# Patient Record
Sex: Male | Born: 1945
Health system: Southern US, Community
[De-identification: ages and names within clinical notes are randomized; demographics above are authoritative.]

## PROBLEM LIST (undated history)

## (undated) DIAGNOSIS — J45909 Unspecified asthma, uncomplicated: Secondary | ICD-10-CM

## (undated) DIAGNOSIS — I10 Essential (primary) hypertension: Secondary | ICD-10-CM

## (undated) DIAGNOSIS — T7840XA Allergy, unspecified, initial encounter: Secondary | ICD-10-CM

## (undated) DIAGNOSIS — E119 Type 2 diabetes mellitus without complications: Secondary | ICD-10-CM

## (undated) DIAGNOSIS — K219 Gastro-esophageal reflux disease without esophagitis: Secondary | ICD-10-CM

## (undated) DIAGNOSIS — R002 Palpitations: Secondary | ICD-10-CM

## (undated) HISTORY — DX: Essential (primary) hypertension: I10

## (undated) HISTORY — PX: CATARACT EXTRACTION: SUR2

## (undated) HISTORY — PX: KNEE SURGERY: SHX244

## (undated) HISTORY — DX: Type 2 diabetes mellitus without complications: E11.9

## (undated) HISTORY — PX: ELBOW SURGERY: SHX618

## (undated) HISTORY — DX: Gastro-esophageal reflux disease without esophagitis: K21.9

## (undated) HISTORY — DX: Palpitations: R00.2

## (undated) HISTORY — DX: Allergy, unspecified, initial encounter: T78.40XA

## (undated) HISTORY — PX: TONSILLECTOMY: SUR1361

---

## 2013-08-01 ENCOUNTER — Encounter (HOSPITAL_COMMUNITY): Payer: Self-pay | Admitting: Emergency Medicine

## 2013-08-01 ENCOUNTER — Emergency Department (HOSPITAL_COMMUNITY)
Admission: EM | Admit: 2013-08-01 | Discharge: 2013-08-01 | Disposition: A | Discharge status: Home / Self Care | Payer: MEDICARE | Attending: Emergency Medicine | Admitting: Emergency Medicine

## 2013-08-01 DIAGNOSIS — Z79899 Other long term (current) drug therapy: Secondary | ICD-10-CM | POA: Insufficient documentation

## 2013-08-01 DIAGNOSIS — Z7982 Long term (current) use of aspirin: Secondary | ICD-10-CM | POA: Insufficient documentation

## 2013-08-01 DIAGNOSIS — J45909 Unspecified asthma, uncomplicated: Secondary | ICD-10-CM | POA: Insufficient documentation

## 2013-08-01 DIAGNOSIS — M25559 Pain in unspecified hip: Secondary | ICD-10-CM | POA: Insufficient documentation

## 2013-08-01 DIAGNOSIS — M25552 Pain in left hip: Secondary | ICD-10-CM

## 2013-08-01 HISTORY — DX: Unspecified asthma, uncomplicated: J45.909

## 2013-08-01 MED ORDER — ONDANSETRON 4 MG PO TBDP
8.0000 mg | ORAL_TABLET | Freq: Once | ORAL | Status: AC
  Administered 2013-08-01: 8 mg via ORAL
  Filled 2013-08-01: qty 2

## 2013-08-01 MED ORDER — OXYCODONE-ACETAMINOPHEN 5-325 MG PO TABS: 2.0000 | ORAL_TABLET | Freq: Four times a day (QID) | ORAL | Status: DC | PRN

## 2013-08-01 MED ORDER — PREDNISONE 20 MG PO TABS: ORAL_TABLET | ORAL | Status: DC

## 2013-08-01 MED ORDER — OXYCODONE-ACETAMINOPHEN 5-325 MG PO TABS
2.0000 | ORAL_TABLET | Freq: Once | ORAL | Status: AC
  Administered 2013-08-01: 2 via ORAL
  Filled 2013-08-01: qty 2

## 2013-08-01 MED ORDER — ONDANSETRON HCL 4 MG PO TABS: 4.0000 mg | ORAL_TABLET | Freq: Four times a day (QID) | ORAL | Status: DC

## 2013-08-01 NOTE — ED Provider Notes (Signed)
CSN: [id number]     Arrival date & time 08/01/13  1221 History   First MD Initiated Contact with Patient 08/01/13 1354     Chief Complaint  Patient presents with  . Hip Pain   (Consider location/radiation/quality/duration/timing/severity/associated sxs/prior Treatment) HPI Comments: Patient is a 67 year old male with history of asthma who presents today for 2 weeks of gradually worsening left hip pain. The pain is a sharp, stabbing pain. It is worse with movement and palpation. He has taken ibuprofen at home with no relief. The pain radiates into his left leg. There is no injury to his hip or back. He has not had pain like this in the past. The pain is becoming so severe he is having difficulty ambulating. He does not use a walker or a cane to ambulate. He denies bowel or bladder incontinence, IV drug abuse, history of cancer. No fevers, chills, numbness, weakness, paresthesias.  The history is provided by the patient. No language interpreter was used.    Past Medical History  Diagnosis Date  . Asthma    History reviewed. No pertinent past surgical history. History reviewed. No pertinent family history. History  Substance Use Topics  . Smoking status: Never Smoker   . Smokeless tobacco: Not on file  . Alcohol Use: Yes     Comment: occ    Review of Systems  Constitutional: Negative for fever and chills.  Respiratory: Negative for shortness of breath.   Cardiovascular: Negative for chest pain.  Gastrointestinal: Negative for nausea, vomiting and abdominal pain.  Genitourinary: Negative for difficulty urinating.  Musculoskeletal: Positive for myalgias, back pain and arthralgias. Negative for gait problem.  All other systems reviewed and are negative.    Allergies  Coconut oil; Iodine; and Shellfish allergy  Home Medications   Current Outpatient Rx  Name  Route  Sig  Dispense  Refill  . albuterol (PROVENTIL HFA;VENTOLIN HFA) 108 (90 BASE) MCG/ACT inhaler   Inhalation  Inhale 2 puffs into the lungs every 6 (six) hours as needed for wheezing or shortness of breath.         Marland Kitchen aspirin 325 MG tablet   Oral   Take 325 mg by mouth every 4 (four) hours as needed for pain.         Marland Kitchen aspirin 81 MG tablet   Oral   Take 81 mg by mouth daily.         . Fluticasone-Salmeterol (ADVAIR) 500-50 MCG/DOSE AEPB   Inhalation   Inhale 1 puff into the lungs every 12 (twelve) hours.         Marland Kitchen lisinopril (PRINIVIL,ZESTRIL) 20 MG tablet   Oral   Take 20 mg by mouth daily.         . metoprolol succinate (TOPROL-XL) 50 MG 24 hr tablet   Oral   Take 50 mg by mouth daily. Take with or immediately following a meal.         . montelukast (SINGULAIR) 10 MG tablet   Oral   Take 10 mg by mouth at bedtime.         . ondansetron (ZOFRAN) 4 MG tablet   Oral   Take 1 tablet (4 mg total) by mouth every 6 (six) hours.   12 tablet   0   . oxyCODONE-acetaminophen (PERCOCET/ROXICET) 5-325 MG per tablet   Oral   Take 2 tablets by mouth every 6 (six) hours as needed for pain.   12 tablet   0   . predniSONE (DELTASONE) 20  MG tablet      3 tabs po day one, then 2 tabs daily x 4 days   11 tablet   0    BP 153/81  Pulse 84  Temp(Src) 98 F (36.7 C) (Oral)  Resp 16  SpO2 100% Physical Exam  Nursing note and vitals reviewed. Constitutional: He is oriented to person, place, and time. He appears well-developed and well-nourished. No distress.  HENT:  Head: Normocephalic and atraumatic.  Right Ear: External ear normal.  Left Ear: External ear normal.  Nose: Nose normal.  Eyes: Conjunctivae are normal.  Neck: Normal range of motion. No tracheal deviation present.  Cardiovascular: Normal rate, regular rhythm, normal heart sounds, intact distal pulses and normal pulses.   Pulmonary/Chest: Effort normal and breath sounds normal. No stridor.  Abdominal: Soft. He exhibits no distension. There is no tenderness.  Musculoskeletal: Normal range of motion.  Tender to  palpation over left SI joint. Logroll test negative bilaterally. No tenderness to palpation of her lumbar spine. No deformities or step-offs.  Neurological: He is alert and oriented to person, place, and time. He has normal strength. No sensory deficit.  neurovascularly intact.  Skin: Skin is warm and dry. He is not diaphoretic.  Psychiatric: He has a normal mood and affect. His behavior is normal.    ED Course  Procedures (including critical care time) Labs Review Labs Reviewed - No data to display Imaging Review No results found.  MDM   1. Left hip pain    Patient with back pain.  No neurological deficits and normal neuro exam.  Patient can walk but states is painful.  No loss of bowel or bladder control.  No concern for cauda equina.  No fever, night sweats, weight loss, h/o cancer, IVDU.  RICE protocol and pain medicine indicated and discussed with patient. He was given 5 day course of prednisone and resource guide for PCP follow up.     Mora Bellman, PA-C 08/01/13 [id number]

## 2013-08-01 NOTE — ED Notes (Signed)
Pt c/o left hip pain with radiation down left leg x 2 weeks with growing difficulty ambulating due to pain

## 2013-08-01 NOTE — ED Notes (Signed)
Pt comfortable with d/c and f/u instructions. Prescriptions x3 

## 2013-08-02 NOTE — ED Provider Notes (Signed)
Medical screening examination/treatment/procedure(s) were performed by non-physician practitioner and as supervising physician I was immediately available for consultation/collaboration.   Joya Gaskins, MD 08/02/13 1147

## 2013-08-07 ENCOUNTER — Emergency Department (HOSPITAL_COMMUNITY)
Admission: EM | Admit: 2013-08-07 | Discharge: 2013-08-07 | Disposition: A | Discharge status: Home / Self Care | Payer: MEDICARE | Attending: Emergency Medicine | Admitting: Emergency Medicine

## 2013-08-07 ENCOUNTER — Emergency Department (HOSPITAL_COMMUNITY): Payer: MEDICARE

## 2013-08-07 ENCOUNTER — Encounter (HOSPITAL_COMMUNITY): Payer: Self-pay | Admitting: *Deleted

## 2013-08-07 DIAGNOSIS — Z79899 Other long term (current) drug therapy: Secondary | ICD-10-CM | POA: Insufficient documentation

## 2013-08-07 DIAGNOSIS — M479 Spondylosis, unspecified: Secondary | ICD-10-CM

## 2013-08-07 DIAGNOSIS — Z888 Allergy status to other drugs, medicaments and biological substances status: Secondary | ICD-10-CM | POA: Insufficient documentation

## 2013-08-07 DIAGNOSIS — J45909 Unspecified asthma, uncomplicated: Secondary | ICD-10-CM | POA: Insufficient documentation

## 2013-08-07 DIAGNOSIS — M79609 Pain in unspecified limb: Secondary | ICD-10-CM | POA: Insufficient documentation

## 2013-08-07 DIAGNOSIS — Z7982 Long term (current) use of aspirin: Secondary | ICD-10-CM | POA: Insufficient documentation

## 2013-08-07 DIAGNOSIS — M549 Dorsalgia, unspecified: Secondary | ICD-10-CM | POA: Insufficient documentation

## 2013-08-07 DIAGNOSIS — M4310 Spondylolisthesis, site unspecified: Secondary | ICD-10-CM | POA: Insufficient documentation

## 2013-08-07 MED ORDER — OXYCODONE-ACETAMINOPHEN 5-325 MG PO TABS
2.0000 | ORAL_TABLET | Freq: Once | ORAL | Status: AC
  Administered 2013-08-07: 2 via ORAL
  Filled 2013-08-07: qty 2

## 2013-08-07 MED ORDER — OXYCODONE-ACETAMINOPHEN 5-325 MG PO TABS: 1.0000 | ORAL_TABLET | ORAL | Status: DC | PRN

## 2013-08-07 NOTE — ED Provider Notes (Signed)
CSN: [id number]     Arrival date & time 08/07/13  1640 History  This chart was scribed for non-physician practitioner, Coral Ceo, PA-C working with Raeford Razor, MD by Greggory Stallion, ED scribe. This patient was seen in room TR10C/TR10C and the patient's care was started at 7:35 PM.   Chief Complaint  Patient presents with  . Hip Pain   The history is provided by the patient. No language interpreter was used.    HPI Comments: Brian Mullen is a 67 y.o. male who presents to the Emergency Department complaining of gradually worsening left hip pain that radiates to his left upper leg that started several weeks ago. Pt denies any injuries. He was here last week (08/01/13)  for the same and was given pain medication with some relief. Pt states no xrays were done when he was here last time. Pt has an orthopedic appointment 08/18/13 and states the pain is too much to bear until then. He ran out of his medication.  Pt denies weakness, numbness, tingling, loss of sensation, fever, chills, abdominal pain, nausea, emesis and bowel or bladder incontinence. Pain is located in his left hip and lower middle back.  Pain is worse with walking and movement.    Past Medical History  Diagnosis Date  . Asthma    History reviewed. No pertinent past surgical history. No family history on file. History  Substance Use Topics  . Smoking status: Never Smoker   . Smokeless tobacco: Not on file  . Alcohol Use: Yes     Comment: occ    Review of Systems  Constitutional: Negative for fever, chills, activity change, appetite change and fatigue.  HENT: Negative for neck pain and neck stiffness.   Eyes: Negative for visual disturbance.  Respiratory: Negative for cough and shortness of breath.   Cardiovascular: Negative for chest pain and leg swelling.  Gastrointestinal: Negative for nausea, vomiting and abdominal pain.  Genitourinary: Negative for dysuria.       Denies bowel or bladder incontinence.    Musculoskeletal: Positive for myalgias, back pain and arthralgias. Negative for joint swelling and gait problem.  Skin: Negative for rash and wound.  Neurological: Negative for dizziness, syncope, weakness, light-headedness, numbness and headaches.  All other systems reviewed and are negative.    Allergies  Coconut oil; Iodine; and Shellfish allergy  Home Medications   Current Outpatient Rx  Name  Route  Sig  Dispense  Refill  . albuterol (PROVENTIL HFA;VENTOLIN HFA) 108 (90 BASE) MCG/ACT inhaler   Inhalation   Inhale 2 puffs into the lungs every 6 (six) hours as needed for wheezing or shortness of breath.         Marland Kitchen aspirin 325 MG tablet   Oral   Take 325 mg by mouth every 4 (four) hours as needed for pain.         Marland Kitchen aspirin 81 MG tablet   Oral   Take 81 mg by mouth daily.         . Fluticasone-Salmeterol (ADVAIR) 500-50 MCG/DOSE AEPB   Inhalation   Inhale 1 puff into the lungs every 12 (twelve) hours.         Marland Kitchen lisinopril (PRINIVIL,ZESTRIL) 20 MG tablet   Oral   Take 20 mg by mouth daily.         . metoprolol succinate (TOPROL-XL) 50 MG 24 hr tablet   Oral   Take 50 mg by mouth daily.          . montelukast (  SINGULAIR) 10 MG tablet   Oral   Take 10 mg by mouth at bedtime.         . ondansetron (ZOFRAN) 4 MG tablet   Oral   Take 4 mg by mouth every 4 (four) hours as needed for nausea.         Marland Kitchen oxyCODONE-acetaminophen (PERCOCET/ROXICET) 5-325 MG per tablet   Oral   Take 2 tablets by mouth every 6 (six) hours as needed for pain.   12 tablet   0    BP 157/86  Pulse 82  Temp(Src) 98.4 F (36.9 C) (Oral)  Resp 17  SpO2 98%  Filed Vitals:   08/07/13 1726 08/07/13 2111  BP: 157/86 127/81  Pulse: 82 73  Temp: 98.4 F (36.9 C) 97.3 F (36.3 C)  TempSrc: Oral   Resp: 17 18  SpO2: 98%     Physical Exam  Nursing note and vitals reviewed. Constitutional: He is oriented to person, place, and time. He appears well-developed and  well-nourished. No distress.  HENT:  Head: Normocephalic and atraumatic.  Right Ear: External ear normal.  Left Ear: External ear normal.  Mouth/Throat: Oropharynx is clear and moist.  Eyes: Conjunctivae and EOM are normal. Right eye exhibits no discharge. Left eye exhibits no discharge.  Neck: Normal range of motion. Neck supple. No tracheal deviation present.  No cervical spinal or paraspinal tenderness to palpation  Cardiovascular: Normal rate, regular rhythm, normal heart sounds and intact distal pulses.  Exam reveals no gallop and no friction rub.   No murmur heard. Dorsalis pedis pulses present and equal bilaterally.   Pulmonary/Chest: Effort normal and breath sounds normal. No respiratory distress. He has no wheezes. He has no rales.  Abdominal: Soft. He exhibits no distension. There is no tenderness.  Musculoskeletal: Normal range of motion. He exhibits tenderness. He exhibits no edema.  Full ROM of left hip. Knee and hip flexion and extension normal. Able to ambulate. Diffuse tenderness to palpation to the lower lumbar spine with paraspinal and spinal tenderness to palpation.  No pain to the left hip throughout.    Neurological: He is alert and oriented to person, place, and time.  Skin: Skin is warm and dry. He is not diaphoretic.  Psychiatric: He has a normal mood and affect. His behavior is normal.    ED Course  Procedures (including critical care time)  DIAGNOSTIC STUDIES: Oxygen Saturation is 98% on RA, normal by my interpretation.    COORDINATION OF CARE: 7:43 PM-Discussed treatment plan which includes xray and short course pain medication with pt at bedside and pt agreed to plan. Advised pt to keep his orthopedic appointment and follow up with them.   Labs Review Labs Reviewed - No data to display Imaging Review Dg Lumbar Spine Complete  08/07/2013   CLINICAL DATA:  Remote fall. Left hip pain. No recent injury reported.  EXAM: LUMBAR SPINE - COMPLETE 4+ VIEW   COMPARISON:  None.  FINDINGS: There are 5 lumbar type vertebral bodies. The alignment is normal. There is intervertebral spurring throughout the lumbar spine with chronic appearing disc space loss at L5-S1. There is facet disease inferiorly. No fracture or pars defect is seen.  IMPRESSION: Lumbar spondylosis as described, most advanced at L5-S1. No acute osseous findings or malalignment.   Electronically Signed   By: Roxy Horseman   On: 08/07/2013 20:52   Dg Hip Complete Left  08/07/2013   CLINICAL DATA:  Chronic intermittent left hip pain.  EXAM: LEFT HIP -  COMPLETE 2+ VIEW  COMPARISON:  None.  FINDINGS: There is no evidence of hip fracture or dislocation. There is no evidence of arthropathy or other focal bone abnormality.  IMPRESSION: Negative.   Electronically Signed   By: Andreas Newport M.D.   On: 08/07/2013 20:45    MDM  No diagnosis found.  Tomislav Micale is a 66 y.o. male who presents to the Emergency Department complaining of gradually worsening left hip pain.  X-rays of left hip and lower lumbar spine ordered to further evaluate.  Percocet ordered for pain.      Etiology of hip and back pain is likely due to spondylosis. X-rays negative for fx or dislocation.  Patient was prescribed Percocet for outpatient management.  Patient was neurovascularly intact.  Patient instructed to follow-up at his scheduled orthopedic appointment for further evaluation and management.  Patient was instructed to return to the ED if they experience any fever, weakness, loss of sensation, loss of bowel/bladder function or other concerns.  Patient was in agreement with discharge and plan.     Final impressions: 1. Lower back pain     Luiz Iron PA-C   I personally performed the services described in this documentation, which was scribed in my presence. The recorded information has been reviewed and is accurate.   Jillyn Ledger, PA-C 08/08/13 2234

## 2013-08-07 NOTE — ED Notes (Signed)
Pt tx here last week for L hip pain.  Pain meds initially improved pain, but pain has returned.  Has appointment with ortho doc, but not until Oct 3rd and pain is too great to wait until then.

## 2013-08-10 NOTE — ED Provider Notes (Signed)
Medical screening examination/treatment/procedure(s) were performed by non-physician practitioner and as supervising physician I was immediately available for consultation/collaboration.  Calia Napp, MD 08/10/13 0634 

## 2013-08-18 ENCOUNTER — Telehealth: Payer: Self-pay

## 2013-08-18 ENCOUNTER — Encounter: Payer: Self-pay | Admitting: Family

## 2013-08-18 ENCOUNTER — Ambulatory Visit (INDEPENDENT_AMBULATORY_CARE_PROVIDER_SITE_OTHER): Payer: BC Managed Care – PPO | Admitting: Family

## 2013-08-18 VITALS — BP 118/76 | HR 83 | Ht 70.0 in | Wt 194.0 lb

## 2013-08-18 DIAGNOSIS — M25559 Pain in unspecified hip: Secondary | ICD-10-CM

## 2013-08-18 DIAGNOSIS — I10 Essential (primary) hypertension: Secondary | ICD-10-CM | POA: Insufficient documentation

## 2013-08-18 DIAGNOSIS — E1159 Type 2 diabetes mellitus with other circulatory complications: Secondary | ICD-10-CM | POA: Insufficient documentation

## 2013-08-18 DIAGNOSIS — IMO0002 Reserved for concepts with insufficient information to code with codable children: Secondary | ICD-10-CM

## 2013-08-18 DIAGNOSIS — M25552 Pain in left hip: Secondary | ICD-10-CM | POA: Insufficient documentation

## 2013-08-18 DIAGNOSIS — Z23 Encounter for immunization: Secondary | ICD-10-CM

## 2013-08-18 DIAGNOSIS — M5416 Radiculopathy, lumbar region: Secondary | ICD-10-CM

## 2013-08-18 MED ORDER — PREDNISONE 20 MG PO TABS: ORAL_TABLET | ORAL | Status: AC

## 2013-08-18 MED ORDER — HYDROCODONE-ACETAMINOPHEN 10-325 MG PO TABS: 1.0000 | ORAL_TABLET | Freq: Three times a day (TID) | ORAL | Status: DC | PRN

## 2013-08-18 NOTE — Telephone Encounter (Signed)
Pt is going to be traveling next week, and is concerned about the pain that his hip is going to cause him. He states that he would like a small refill or something like oxyCODONE-acetaminophen (PERCOCET/ROXICET) 5-325 MG per tablet. Please assist.

## 2013-08-18 NOTE — Progress Notes (Signed)
Subjective:    Patient ID: Brian Mullen, male    DOB: May 18, 1946, 67 y.o.   MRN: [id number]  HPI 67 year old white male, new patient to the practice and to be established. He was seen in the emergency department recently with left hip and low back pain. X-rays showed degeneration of the lumbar spine but x-ray of the hip was normal. He describes pain in his left hip that radiates down his left leg a 4/10, worse with certain movements. Described as sharp and constant dull ache at times. He taken Percocet in the past that has helped lessen the pain. But nothing completely gets rid of the pain. Reports a fall in 2001 from a ladder that was 9 feet onto a basketball floor.   Review of Systems  Constitutional: Negative.   Respiratory: Negative.   Cardiovascular: Negative.   Genitourinary: Negative.   Musculoskeletal: Positive for back pain.       Left hip pain  Skin: Negative.   Neurological: Negative.  Negative for weakness and numbness.  Psychiatric/Behavioral: Negative.    Past Medical History  Diagnosis Date  . Asthma   . GERD (gastroesophageal reflux disease)   . Hypertension   . Allergy   . Diabetes mellitus without complication     History   Social History  . Marital Status: Married    Spouse Name: N/A    Number of Children: N/A  . Years of Education: N/A   Occupational History  . Not on file.   Social History Main Topics  . Smoking status: Never Smoker   . Smokeless tobacco: Not on file  . Alcohol Use: Yes     Comment: occ  . Drug Use: No  . Sexual Activity: Not on file   Other Topics Concern  . Not on file   Social History Narrative  . No narrative on file    Past Surgical History  Procedure Laterality Date  . Tonsillectomy      No family history on file.  Allergies  Allergen Reactions  . Coconut Oil Anaphylaxis  . Iodine Anaphylaxis  . Shellfish Allergy Anaphylaxis    Current Outpatient Prescriptions on File Prior to Visit  Medication Sig  Dispense Refill  . albuterol (PROVENTIL HFA;VENTOLIN HFA) 108 (90 BASE) MCG/ACT inhaler Inhale 2 puffs into the lungs every 6 (six) hours as needed for wheezing or shortness of breath.      Marland Kitchen aspirin 81 MG tablet Take 81 mg by mouth daily.      . Fluticasone-Salmeterol (ADVAIR) 500-50 MCG/DOSE AEPB Inhale 1 puff into the lungs every 12 (twelve) hours.      Marland Kitchen lisinopril (PRINIVIL,ZESTRIL) 20 MG tablet Take 20 mg by mouth daily.      . metoprolol succinate (TOPROL-XL) 50 MG 24 hr tablet Take 50 mg by mouth daily.       . montelukast (SINGULAIR) 10 MG tablet Take 10 mg by mouth at bedtime.      Marland Kitchen oxyCODONE-acetaminophen (PERCOCET/ROXICET) 5-325 MG per tablet Take 2 tablets by mouth every 6 (six) hours as needed for pain.  12 tablet  0  . oxyCODONE-acetaminophen (PERCOCET/ROXICET) 5-325 MG per tablet Take 1-2 tablets by mouth every 4 (four) hours as needed for pain.  15 tablet  0  . aspirin 325 MG tablet Take 325 mg by mouth every 4 (four) hours as needed for pain.      Marland Kitchen ondansetron (ZOFRAN) 4 MG tablet Take 4 mg by mouth every 4 (four) hours as needed for nausea.  No current facility-administered medications on file prior to visit.    BP 118/76  Pulse 83  Ht 5\' 10"  (1.778 m)  Wt 194 lb (87.998 kg)  BMI 27.84 kg/m2chart    Objective:   Physical Exam  Constitutional: He is oriented to person, place, and time. He appears well-developed and well-nourished.  Neck: Normal range of motion. Neck supple.  Cardiovascular: Normal rate, regular rhythm and normal heart sounds.   Pulmonary/Chest: Effort normal and breath sounds normal.  Abdominal: Soft. Bowel sounds are normal.  Musculoskeletal:  No tenderness elicited to palpation of the lumbar spine. Minimal pain with range of motion of the left hip. Particularly adduction.  Neurological: He is alert and oriented to person, place, and time.  Skin: Skin is warm and dry.  Psychiatric: He has a normal mood and affect.          Assessment &  Plan:  Assessment: 1. Low back pain 2. Left hip pain 3. Lumbar radiculopathy 4. Hypertension  Plan: Prednisone 60x3, 40x3, 20x3. Consider physical therapy. Low back strengthening exercises provided today. Consider MRI of the L-spine because I think that's where the pain is originating from. Currently off [place] with any questions or concerns. Recheck in 4 weeks for complete physical exam and sooner as needed.

## 2013-08-18 NOTE — Patient Instructions (Signed)
Back Exercises These exercises may help you when beginning to rehabilitate your injury. Your symptoms may resolve with or without further involvement from your physician, physical therapist or athletic trainer. While completing these exercises, remember:   Restoring tissue flexibility helps normal motion to return to the joints. This allows healthier, less painful movement and activity.  An effective stretch should be held for at least 30 seconds.  A stretch should never be painful. You should only feel a gentle lengthening or release in the stretched tissue. STRETCH  Extension, Prone on Elbows   Lie on your stomach on the floor, a bed will be too soft. Place your palms about shoulder width apart and at the height of your head.  Place your elbows under your shoulders. If this is too painful, stack pillows under your chest.  Allow your body to relax so that your hips drop lower and make contact more completely with the floor.  Hold this position for __________ seconds.  Slowly return to lying flat on the floor. Repeat __________ times. Complete this exercise __________ times per day.  RANGE OF MOTION  Extension, Prone Press Ups   Lie on your stomach on the floor, a bed will be too soft. Place your palms about shoulder width apart and at the height of your head.  Keeping your back as relaxed as possible, slowly straighten your elbows while keeping your hips on the floor. You may adjust the placement of your hands to maximize your comfort. As you gain motion, your hands will come more underneath your shoulders.  Hold this position __________ seconds.  Slowly return to lying flat on the floor. Repeat __________ times. Complete this exercise __________ times per day.  RANGE OF MOTION- Quadruped, Neutral Spine   Assume a hands and knees position on a firm surface. Keep your hands under your shoulders and your knees under your hips. You may place padding under your knees for comfort.  Drop  your head and point your tail bone toward the ground below you. This will round out your low back like an angry cat. Hold this position for __________ seconds.  Slowly lift your head and release your tail bone so that your back sags into a large arch, like an old horse.  Hold this position for __________ seconds.  Repeat this until you feel limber in your low back.  Now, find your "sweet spot." This will be the most comfortable position somewhere between the two previous positions. This is your neutral spine. Once you have found this position, tense your stomach muscles to support your low back.  Hold this position for __________ seconds. Repeat __________ times. Complete this exercise __________ times per day.  STRETCH  Flexion, Single Knee to Chest   Lie on a firm bed or floor with both legs extended in front of you.  Keeping one leg in contact with the floor, bring your opposite knee to your chest. Hold your leg in place by either grabbing behind your thigh or at your knee.  Pull until you feel a gentle stretch in your low back. Hold __________ seconds.  Slowly release your grasp and repeat the exercise with the opposite side. Repeat __________ times. Complete this exercise __________ times per day.  STRETCH - Hamstrings, Standing  Stand or sit and extend your right / left leg, placing your foot on a chair or foot stool  Keeping a slight arch in your low back and your hips straight forward.  Lead with your chest and   lean forward at the waist until you feel a gentle stretch in the back of your right / left knee or thigh. (When done correctly, this exercise requires leaning only a small distance.)  Hold this position for __________ seconds. Repeat __________ times. Complete this stretch __________ times per day. STRENGTHENING  Deep Abdominals, Pelvic Tilt   Lie on a firm bed or floor. Keeping your legs in front of you, bend your knees so they are both pointed toward the ceiling and  your feet are flat on the floor.  Tense your lower abdominal muscles to press your low back into the floor. This motion will rotate your pelvis so that your tail bone is scooping upwards rather than pointing at your feet or into the floor.  With a gentle tension and even breathing, hold this position for __________ seconds. Repeat __________ times. Complete this exercise __________ times per day.  STRENGTHENING  Abdominals, Crunches   Lie on a firm bed or floor. Keeping your legs in front of you, bend your knees so they are both pointed toward the ceiling and your feet are flat on the floor. Cross your arms over your chest.  Slightly tip your chin down without bending your neck.  Tense your abdominals and slowly lift your trunk high enough to just clear your shoulder blades. Lifting higher can put excessive stress on the low back and does not further strengthen your abdominal muscles.  Control your return to the starting position. Repeat __________ times. Complete this exercise __________ times per day.  STRENGTHENING  Quadruped, Opposite UE/LE Lift   Assume a hands and knees position on a firm surface. Keep your hands under your shoulders and your knees under your hips. You may place padding under your knees for comfort.  Find your neutral spine and gently tense your abdominal muscles so that you can maintain this position. Your shoulders and hips should form a rectangle that is parallel with the floor and is not twisted.  Keeping your trunk steady, lift your right hand no higher than your shoulder and then your left leg no higher than your hip. Make sure you are not holding your breath. Hold this position __________ seconds.  Continuing to keep your abdominal muscles tense and your back steady, slowly return to your starting position. Repeat with the opposite arm and leg. Repeat __________ times. Complete this exercise __________ times per day. Document Released: 11/20/2005 Document  Revised: 01/25/2012 Document Reviewed: 02/14/2009 [employer] Patient Information 2014 [employer].  

## 2013-09-22 ENCOUNTER — Encounter: Payer: Self-pay | Admitting: Family

## 2013-09-22 ENCOUNTER — Ambulatory Visit (INDEPENDENT_AMBULATORY_CARE_PROVIDER_SITE_OTHER): Payer: MEDICARE | Admitting: Family

## 2013-09-22 VITALS — BP 120/80 | HR 75 | Ht 69.0 in | Wt 199.0 lb

## 2013-09-22 DIAGNOSIS — Z23 Encounter for immunization: Secondary | ICD-10-CM

## 2013-09-22 DIAGNOSIS — I10 Essential (primary) hypertension: Secondary | ICD-10-CM

## 2013-09-22 DIAGNOSIS — Z0000 Encounter for general adult medical examination without abnormal findings: Secondary | ICD-10-CM

## 2013-09-22 DIAGNOSIS — Z125 Encounter for screening for malignant neoplasm of prostate: Secondary | ICD-10-CM

## 2013-09-22 DIAGNOSIS — M5416 Radiculopathy, lumbar region: Secondary | ICD-10-CM | POA: Insufficient documentation

## 2013-09-22 DIAGNOSIS — N400 Enlarged prostate without lower urinary tract symptoms: Secondary | ICD-10-CM

## 2013-09-22 DIAGNOSIS — IMO0002 Reserved for concepts with insufficient information to code with codable children: Secondary | ICD-10-CM

## 2013-09-22 LAB — BASIC METABOLIC PANEL
BUN: 14 mg/dL (ref 6–23)
CO2: 30 mEq/L (ref 19–32)
Calcium: 9.4 mg/dL (ref 8.4–10.5)
Chloride: 100 mEq/L (ref 96–112)
Creatinine, Ser: 0.8 mg/dL (ref 0.4–1.5)
GFR: 96.66 mL/min (ref >60.00)
Glucose, Bld: 147 mg/dL — ABNORMAL HIGH (ref 70–99)
Potassium: 4.4 mEq/L (ref 3.5–5.1)
Sodium: 136 mEq/L (ref 135–145)

## 2013-09-22 LAB — CBC WITH DIFFERENTIAL/PLATELET
Basophils Absolute: 0 10*3/uL (ref 0.0–0.1)
Basophils Relative: 0.4 % (ref 0.0–3.0)
Eosinophils Absolute: 0.2 10*3/uL (ref 0.0–0.7)
Eosinophils Relative: 2.6 % (ref 0.0–5.0)
HCT: 43.3 % (ref 39.0–52.0)
Hemoglobin: 14.4 g/dL (ref 13.0–17.0)
Lymphocytes Relative: 28 % (ref 12.0–46.0)
Lymphs Abs: 2.4 10*3/uL (ref 0.7–4.0)
MCHC: 33.3 g/dL (ref 30.0–36.0)
MCV: 88.9 fl (ref 78.0–100.0)
Monocytes Absolute: 0.6 10*3/uL (ref 0.1–1.0)
Monocytes Relative: 7.2 % (ref 3.0–12.0)
Neutro Abs: 5.3 10*3/uL (ref 1.4–7.7)
Neutrophils Relative %: 61.8 % (ref 43.0–77.0)
Platelets: 327 10*3/uL (ref 150.0–400.0)
RBC: 4.87 Mil/uL (ref 4.22–5.81)
RDW: 13.9 % (ref 11.5–14.6)
WBC: 8.6 10*3/uL (ref 4.5–10.5)

## 2013-09-22 LAB — HEPATIC FUNCTION PANEL
ALT: 15 U/L (ref 0–53)
AST: 16 U/L (ref 0–37)
Albumin: 3.9 g/dL (ref 3.5–5.2)
Alkaline Phosphatase: 42 U/L (ref 39–117)
Bilirubin, Direct: 0 mg/dL (ref 0.0–0.3)
Total Bilirubin: 0.6 mg/dL (ref 0.3–1.2)
Total Protein: 6.9 g/dL (ref 6.0–8.3)

## 2013-09-22 LAB — LIPID PANEL
Cholesterol: 214 mg/dL — ABNORMAL HIGH (ref 0–200)
HDL: 42.5 mg/dL (ref >39.00)
Total CHOL/HDL Ratio: 5
Triglycerides: 184 mg/dL — ABNORMAL HIGH (ref 0.0–149.0)
VLDL: 36.8 mg/dL (ref 0.0–40.0)

## 2013-09-22 LAB — TSH: TSH: 1.45 u[IU]/mL (ref 0.35–5.50)

## 2013-09-22 LAB — LDL CHOLESTEROL, DIRECT: Direct LDL: 151.6 mg/dL

## 2013-09-22 NOTE — Progress Notes (Signed)
Pre-visit discussion using our clinic review tool. No additional management support is needed unless otherwise documented below in the visit note.  

## 2013-09-22 NOTE — Patient Instructions (Signed)
Back Exercises These exercises may help you when beginning to rehabilitate your injury. Your symptoms may resolve with or without further involvement from your physician, physical therapist or athletic trainer. While completing these exercises, remember:   Restoring tissue flexibility helps normal motion to return to the joints. This allows healthier, less painful movement and activity.  An effective stretch should be held for at least 30 seconds.  A stretch should never be painful. You should only feel a gentle lengthening or release in the stretched tissue. STRETCH  Extension, Prone on Elbows   Lie on your stomach on the floor, a bed will be too soft. Place your palms about shoulder width apart and at the height of your head.  Place your elbows under your shoulders. If this is too painful, stack pillows under your chest.  Allow your body to relax so that your hips drop lower and make contact more completely with the floor.  Hold this position for __________ seconds.  Slowly return to lying flat on the floor. Repeat __________ times. Complete this exercise __________ times per day.  RANGE OF MOTION  Extension, Prone Press Ups   Lie on your stomach on the floor, a bed will be too soft. Place your palms about shoulder width apart and at the height of your head.  Keeping your back as relaxed as possible, slowly straighten your elbows while keeping your hips on the floor. You may adjust the placement of your hands to maximize your comfort. As you gain motion, your hands will come more underneath your shoulders.  Hold this position __________ seconds.  Slowly return to lying flat on the floor. Repeat __________ times. Complete this exercise __________ times per day.  RANGE OF MOTION- Quadruped, Neutral Spine   Assume a hands and knees position on a firm surface. Keep your hands under your shoulders and your knees under your hips. You may place padding under your knees for comfort.  Drop  your head and point your tail bone toward the ground below you. This will round out your low back like an angry cat. Hold this position for __________ seconds.  Slowly lift your head and release your tail bone so that your back sags into a large arch, like an old horse.  Hold this position for __________ seconds.  Repeat this until you feel limber in your low back.  Now, find your "sweet spot." This will be the most comfortable position somewhere between the two previous positions. This is your neutral spine. Once you have found this position, tense your stomach muscles to support your low back.  Hold this position for __________ seconds. Repeat __________ times. Complete this exercise __________ times per day.  STRETCH  Flexion, Single Knee to Chest   Lie on a firm bed or floor with both legs extended in front of you.  Keeping one leg in contact with the floor, bring your opposite knee to your chest. Hold your leg in place by either grabbing behind your thigh or at your knee.  Pull until you feel a gentle stretch in your low back. Hold __________ seconds.  Slowly release your grasp and repeat the exercise with the opposite side. Repeat __________ times. Complete this exercise __________ times per day.  STRETCH - Hamstrings, Standing  Stand or sit and extend your right / left leg, placing your foot on a chair or foot stool  Keeping a slight arch in your low back and your hips straight forward.  Lead with your chest and   lean forward at the waist until you feel a gentle stretch in the back of your right / left knee or thigh. (When done correctly, this exercise requires leaning only a small distance.)  Hold this position for __________ seconds. Repeat __________ times. Complete this stretch __________ times per day. STRENGTHENING  Deep Abdominals, Pelvic Tilt   Lie on a firm bed or floor. Keeping your legs in front of you, bend your knees so they are both pointed toward the ceiling and  your feet are flat on the floor.  Tense your lower abdominal muscles to press your low back into the floor. This motion will rotate your pelvis so that your tail bone is scooping upwards rather than pointing at your feet or into the floor.  With a gentle tension and even breathing, hold this position for __________ seconds. Repeat __________ times. Complete this exercise __________ times per day.  STRENGTHENING  Abdominals, Crunches   Lie on a firm bed or floor. Keeping your legs in front of you, bend your knees so they are both pointed toward the ceiling and your feet are flat on the floor. Cross your arms over your chest.  Slightly tip your chin down without bending your neck.  Tense your abdominals and slowly lift your trunk high enough to just clear your shoulder blades. Lifting higher can put excessive stress on the low back and does not further strengthen your abdominal muscles.  Control your return to the starting position. Repeat __________ times. Complete this exercise __________ times per day.  STRENGTHENING  Quadruped, Opposite UE/LE Lift   Assume a hands and knees position on a firm surface. Keep your hands under your shoulders and your knees under your hips. You may place padding under your knees for comfort.  Find your neutral spine and gently tense your abdominal muscles so that you can maintain this position. Your shoulders and hips should form a rectangle that is parallel with the floor and is not twisted.  Keeping your trunk steady, lift your right hand no higher than your shoulder and then your left leg no higher than your hip. Make sure you are not holding your breath. Hold this position __________ seconds.  Continuing to keep your abdominal muscles tense and your back steady, slowly return to your starting position. Repeat with the opposite arm and leg. Repeat __________ times. Complete this exercise __________ times per day. Document Released: 11/20/2005 Document  Revised: 01/25/2012 Document Reviewed: 02/14/2009 [employer] Patient Information 2014 [employer].  

## 2013-09-22 NOTE — Progress Notes (Signed)
Subjective:    Patient ID: Brian Mullen, male    DOB: 1946-09-11, 67 y.o.   MRN: [id number]  HPI  Patient presents for yearly preventative medicine examination. All immunizations and health maintenance protocols were reviewed with the patient and they are up to date with these protocols. Screening laboratory values were reviewed with the patient including screening of hyperlipidemia PSA renal function and hepatic function.There medications past medical history social history problem list and allergies were reviewed in detail. Goals were established with regard to weight loss exercise diet in compliance with medications  1 month ago, patient was treated for lumbar radiculopathy with prednisone and did remarkably well. Patient reports that with the best he walked in a very long time. However, he feels his symptoms gradually returning. Reports low back pain radiating down the left leg. Rates the pain 4/10, worse with bending. Has taken Percocet at times for relief.   Review of Systems  Constitutional: Negative.   HENT: Negative.   Eyes: Negative.   Respiratory: Negative.   Cardiovascular: Negative.   Gastrointestinal: Negative.   Endocrine: Negative.   Genitourinary: Negative.   Musculoskeletal: Negative.   Skin: Negative.   Allergic/Immunologic: Negative.   Neurological: Negative.   Hematological: Negative.   Psychiatric/Behavioral: Negative.    Past Medical History  Diagnosis Date  . Asthma   . GERD (gastroesophageal reflux disease)   . Hypertension   . Allergy   . Diabetes mellitus without complication     History   Social History  . Marital Status: Married    Spouse Name: N/A    Number of Children: N/A  . Years of Education: N/A   Occupational History  . Not on file.   Social History Main Topics  . Smoking status: Never Smoker   . Smokeless tobacco: Not on file  . Alcohol Use: Yes     Comment: occ  . Drug Use: No  . Sexual Activity: Not on file   Other Topics  Concern  . Not on file   Social History Narrative  . No narrative on file    Past Surgical History  Procedure Laterality Date  . Tonsillectomy      No family history on file.  Allergies  Allergen Reactions  . Coconut Oil Anaphylaxis  . Iodine Anaphylaxis  . Shellfish Allergy Anaphylaxis    Current Outpatient Prescriptions on File Prior to Visit  Medication Sig Dispense Refill  . albuterol (PROVENTIL HFA;VENTOLIN HFA) 108 (90 BASE) MCG/ACT inhaler Inhale 2 puffs into the lungs every 6 (six) hours as needed for wheezing or shortness of breath.      Marland Kitchen aspirin 325 MG tablet Take 325 mg by mouth every 4 (four) hours as needed for pain.      Marland Kitchen aspirin 81 MG tablet Take 81 mg by mouth daily.      . Fluticasone-Salmeterol (ADVAIR) 500-50 MCG/DOSE AEPB Inhale 1 puff into the lungs every 12 (twelve) hours.      Marland Kitchen HYDROcodone-acetaminophen (NORCO) 10-325 MG per tablet Take 1 tablet by mouth every 8 (eight) hours as needed for pain.  30 tablet  0  . lisinopril (PRINIVIL,ZESTRIL) 20 MG tablet Take 20 mg by mouth daily.      . metoprolol succinate (TOPROL-XL) 50 MG 24 hr tablet Take 50 mg by mouth daily.       . montelukast (SINGULAIR) 10 MG tablet Take 10 mg by mouth at bedtime.      . ondansetron (ZOFRAN) 4 MG tablet Take 4 mg by  mouth every 4 (four) hours as needed for nausea.      Marland Kitchen oxyCODONE-acetaminophen (PERCOCET/ROXICET) 5-325 MG per tablet Take 2 tablets by mouth every 6 (six) hours as needed for pain.  12 tablet  0  . oxyCODONE-acetaminophen (PERCOCET/ROXICET) 5-325 MG per tablet Take 1-2 tablets by mouth every 4 (four) hours as needed for pain.  15 tablet  0   No current facility-administered medications on file prior to visit.    BP 120/80  Pulse 75  Ht 5\' 9"  (1.753 m)  Wt 199 lb (90.266 kg)  BMI 29.37 kg/m2chart    Objective:   Physical Exam  Constitutional: He is oriented to person, place, and time. He appears well-developed and well-nourished.  HENT:  Head:  Normocephalic.  Right Ear: External ear normal.  Left Ear: External ear normal.  Nose: Nose normal.  Mouth/Throat: Oropharynx is clear and moist.  Eyes: Conjunctivae and EOM are normal. Pupils are equal, round, and reactive to light.  Neck: Normal range of motion. Neck supple. No thyromegaly present.  Cardiovascular: Normal rate, regular rhythm and normal heart sounds.   Pulmonary/Chest: Effort normal and breath sounds normal.  Abdominal: Soft. Bowel sounds are normal. He exhibits no distension. There is no tenderness. There is no rebound.  Genitourinary: Rectum normal, prostate normal and penis normal. Guaiac negative stool. No penile tenderness.  Musculoskeletal: Normal range of motion. He exhibits no edema and no tenderness.  Neurological: He is alert and oriented to person, place, and time. He has normal reflexes. He displays normal reflexes. No cranial nerve deficit. Coordination normal.  Skin: Skin is warm and dry.  Psychiatric: He has a normal mood and affect.          Assessment & Plan:  Assessment:  1. Preventative Health Care 2. Hypertension 3. Lumbar Radiculopathy  Plan: Encouraged a healthy diet, exercise, low cholesterol diet. Continue current meds. Labs sent, will notify patient pending results and discuss further treatment plan if any.

## 2013-09-25 ENCOUNTER — Ambulatory Visit: Payer: Medicare Other

## 2013-09-25 DIAGNOSIS — R7309 Other abnormal glucose: Secondary | ICD-10-CM

## 2013-09-25 LAB — HEMOGLOBIN A1C: Hgb A1c MFr Bld: 7.5 % — ABNORMAL HIGH (ref 4.6–6.5)

## 2013-10-03 ENCOUNTER — Other Ambulatory Visit: Payer: Self-pay | Admitting: Family

## 2013-10-03 ENCOUNTER — Telehealth: Payer: Self-pay | Admitting: Family

## 2013-10-03 DIAGNOSIS — M5416 Radiculopathy, lumbar region: Secondary | ICD-10-CM

## 2013-10-03 MED ORDER — HYDROCODONE-ACETAMINOPHEN 10-325 MG PO TABS: 1.0000 | ORAL_TABLET | Freq: Three times a day (TID) | ORAL | Status: DC | PRN

## 2013-10-03 NOTE — Telephone Encounter (Signed)
MRI ordered. Pain medication  Available for pick up.

## 2013-10-03 NOTE — Telephone Encounter (Signed)
Left message on personally identified voicemail to advise pt Rx ready for pick up and order placed for MRI

## 2013-10-03 NOTE — Telephone Encounter (Signed)
Pt states he is ready for referral for MRI. Pt is having continuous back issues and in a lot of pain. Pt would like to request [facility] due to the stand up mri.  Pt also request refill of HYDROcodone-acetaminophen (NORCO) 10-325 MG per tablet.

## 2013-10-06 ENCOUNTER — Telehealth: Payer: Self-pay | Admitting: Family

## 2013-10-06 NOTE — Telephone Encounter (Signed)
Pt returning your call. pls call back!! 

## 2013-10-06 NOTE — Telephone Encounter (Signed)
Left message to advise pt that I have not called him since I saw him the office to pick up his Rx. Advised pt to call back with any questions or concerns

## 2013-10-09 ENCOUNTER — Telehealth: Payer: Self-pay | Admitting: Family

## 2013-10-09 NOTE — Telephone Encounter (Signed)
Spoke with pt he is aware that the only stand up MRI units in the [place] are in [place] at [place] and another in [place]. Spoke with pt and explained that the MRI at [facility] is a true open MRI. Pt states that he is in [place] and that when he returns to [place] he would be in contact with [place] about MRI

## 2013-10-09 NOTE — Telephone Encounter (Signed)
Pt needs a stand up mri. Is extremely claustrophobic please advise.

## 2014-01-05 ENCOUNTER — Other Ambulatory Visit (INDEPENDENT_AMBULATORY_CARE_PROVIDER_SITE_OTHER): Payer: Medicare HMO

## 2014-01-05 DIAGNOSIS — E1165 Type 2 diabetes mellitus with hyperglycemia: Principal | ICD-10-CM

## 2014-01-05 DIAGNOSIS — IMO0001 Reserved for inherently not codable concepts without codable children: Secondary | ICD-10-CM

## 2014-01-05 LAB — HEMOGLOBIN A1C: Hgb A1c MFr Bld: 6.7 % — ABNORMAL HIGH (ref 4.6–6.5)

## 2014-06-06 ENCOUNTER — Telehealth: Payer: Self-pay | Admitting: Family

## 2014-06-06 NOTE — Telephone Encounter (Signed)
Error/gd °

## 2014-10-26 ENCOUNTER — Ambulatory Visit: Payer: Medicare Other | Admitting: Family

## 2014-10-29 ENCOUNTER — Ambulatory Visit (INDEPENDENT_AMBULATORY_CARE_PROVIDER_SITE_OTHER): Payer: Medicare HMO | Admitting: Family

## 2014-10-29 ENCOUNTER — Ambulatory Visit (INDEPENDENT_AMBULATORY_CARE_PROVIDER_SITE_OTHER): Payer: Medicare HMO

## 2014-10-29 ENCOUNTER — Encounter: Payer: Self-pay | Admitting: Family

## 2014-10-29 VITALS — BP 118/70 | HR 80 | Ht 69.0 in | Wt 193.8 lb

## 2014-10-29 DIAGNOSIS — M179 Osteoarthritis of knee, unspecified: Secondary | ICD-10-CM

## 2014-10-29 DIAGNOSIS — Z23 Encounter for immunization: Secondary | ICD-10-CM

## 2014-10-29 DIAGNOSIS — E119 Type 2 diabetes mellitus without complications: Secondary | ICD-10-CM | POA: Insufficient documentation

## 2014-10-29 DIAGNOSIS — IMO0001 Reserved for inherently not codable concepts without codable children: Secondary | ICD-10-CM | POA: Insufficient documentation

## 2014-10-29 DIAGNOSIS — J449 Chronic obstructive pulmonary disease, unspecified: Secondary | ICD-10-CM

## 2014-10-29 DIAGNOSIS — E1165 Type 2 diabetes mellitus with hyperglycemia: Secondary | ICD-10-CM

## 2014-10-29 DIAGNOSIS — E7800 Pure hypercholesterolemia, unspecified: Secondary | ICD-10-CM | POA: Insufficient documentation

## 2014-10-29 DIAGNOSIS — I10 Essential (primary) hypertension: Secondary | ICD-10-CM

## 2014-10-29 DIAGNOSIS — M1710 Unilateral primary osteoarthritis, unspecified knee: Secondary | ICD-10-CM

## 2014-10-29 DIAGNOSIS — IMO0002 Reserved for concepts with insufficient information to code with codable children: Secondary | ICD-10-CM

## 2014-10-29 LAB — BASIC METABOLIC PANEL
BUN: 15 mg/dL (ref 6–23)
CO2: 29 mEq/L (ref 19–32)
Calcium: 9.1 mg/dL (ref 8.4–10.5)
Chloride: 102 mEq/L (ref 96–112)
Creatinine, Ser: 1.1 mg/dL (ref 0.4–1.5)
GFR: 69.13 mL/min (ref >60.00)
Glucose, Bld: 141 mg/dL — ABNORMAL HIGH (ref 70–99)
Potassium: 4.1 mEq/L (ref 3.5–5.1)
Sodium: 136 mEq/L (ref 135–145)

## 2014-10-29 LAB — CBC WITH DIFFERENTIAL/PLATELET
Basophils Absolute: 0 10*3/uL (ref 0.0–0.1)
Basophils Relative: 0.4 % (ref 0.0–3.0)
Eosinophils Absolute: 0.4 10*3/uL (ref 0.0–0.7)
Eosinophils Relative: 3.3 % (ref 0.0–5.0)
HCT: 44.4 % (ref 39.0–52.0)
Hemoglobin: 14.4 g/dL (ref 13.0–17.0)
Lymphocytes Relative: 25.3 % (ref 12.0–46.0)
Lymphs Abs: 2.9 10*3/uL (ref 0.7–4.0)
MCHC: 32.5 g/dL (ref 30.0–36.0)
MCV: 90.7 fl (ref 78.0–100.0)
Monocytes Absolute: 0.9 10*3/uL (ref 0.1–1.0)
Monocytes Relative: 8 % (ref 3.0–12.0)
Neutro Abs: 7.1 10*3/uL (ref 1.4–7.7)
Neutrophils Relative %: 63 % (ref 43.0–77.0)
Platelets: 315 10*3/uL (ref 150.0–400.0)
RBC: 4.9 Mil/uL (ref 4.22–5.81)
RDW: 13.9 % (ref 11.5–15.5)
WBC: 11.3 10*3/uL — ABNORMAL HIGH (ref 4.0–10.5)

## 2014-10-29 LAB — HEPATIC FUNCTION PANEL
ALT: 10 U/L (ref 0–53)
AST: 11 U/L (ref 0–37)
Albumin: 3.9 g/dL (ref 3.5–5.2)
Alkaline Phosphatase: 63 U/L (ref 39–117)
Bilirubin, Direct: 0 mg/dL (ref 0.0–0.3)
Total Bilirubin: 0.2 mg/dL (ref 0.2–1.2)
Total Protein: 6.8 g/dL (ref 6.0–8.3)

## 2014-10-29 LAB — HEMOGLOBIN A1C: Hgb A1c MFr Bld: 7.4 % — ABNORMAL HIGH (ref 4.6–6.5)

## 2014-10-29 MED ORDER — METOPROLOL SUCCINATE ER 50 MG PO TB24: 50.0000 mg | ORAL_TABLET | Freq: Every day | ORAL | Status: DC

## 2014-10-29 MED ORDER — FLUTICASONE-SALMETEROL 500-50 MCG/DOSE IN AEPB: 1.0000 | INHALATION_SPRAY | Freq: Two times a day (BID) | RESPIRATORY_TRACT | Status: DC

## 2014-10-29 MED ORDER — METFORMIN HCL 500 MG PO TABS: 1000.0000 mg | ORAL_TABLET | Freq: Two times a day (BID) | ORAL | Status: DC

## 2014-10-29 MED ORDER — ALBUTEROL SULFATE HFA 108 (90 BASE) MCG/ACT IN AERS: 2.0000 | INHALATION_SPRAY | Freq: Four times a day (QID) | RESPIRATORY_TRACT | Status: DC | PRN

## 2014-10-29 MED ORDER — DICLOFENAC SODIUM 75 MG PO TBEC: 75.0000 mg | DELAYED_RELEASE_TABLET | Freq: Two times a day (BID) | ORAL | Status: DC

## 2014-10-29 MED ORDER — TIOTROPIUM BROMIDE MONOHYDRATE 18 MCG IN CAPS: 18.0000 ug | ORAL_CAPSULE | Freq: Every day | RESPIRATORY_TRACT | Status: DC

## 2014-10-29 MED ORDER — MONTELUKAST SODIUM 10 MG PO TABS: 10.0000 mg | ORAL_TABLET | Freq: Every day | ORAL | Status: DC

## 2014-10-29 MED ORDER — LISINOPRIL 20 MG PO TABS: 20.0000 mg | ORAL_TABLET | Freq: Every day | ORAL | Status: DC

## 2014-10-29 NOTE — Progress Notes (Signed)
Subjective:    Patient ID: Brian Mullen, male    DOB: 07/23/1946, 68 y.o.   MRN: [id number]  HPI 68 year old white male, nonsmoker, is in today for a recheck of Hypertension, Type 2 Diabetes, COPD. Tolerates medications well. Does not routinely check blood glucose. Reports having joint pain that is worse first thing in the morning and better as the day progresses. Has taken aleve that helps.    Review of Systems  Constitutional: Negative.   HENT: Negative.   Eyes: Negative.   Respiratory: Negative.   Cardiovascular: Negative.   Gastrointestinal: Negative.   Endocrine: Negative.   Genitourinary: Negative.   Musculoskeletal: Negative.   Skin: Negative.   Allergic/Immunologic: Negative.   Neurological: Negative.   Hematological: Negative.   Psychiatric/Behavioral: Negative.    Past Medical History  Diagnosis Date  . Asthma   . GERD (gastroesophageal reflux disease)   . Hypertension   . Allergy   . Diabetes mellitus without complication     History   Social History  . Marital Status: Married    Spouse Name: N/A    Number of Children: N/A  . Years of Education: N/A   Occupational History  . Not on file.   Social History Main Topics  . Smoking status: Never Smoker   . Smokeless tobacco: Not on file  . Alcohol Use: Yes     Comment: occ  . Drug Use: No  . Sexual Activity: Not on file   Other Topics Concern  . Not on file   Social History Narrative    Past Surgical History  Procedure Laterality Date  . Tonsillectomy      History reviewed. No pertinent family history.  Allergies  Allergen Reactions  . Coconut Oil Anaphylaxis  . Iodine Anaphylaxis  . Shellfish Allergy Anaphylaxis    Current Outpatient Prescriptions on File Prior to Visit  Medication Sig Dispense Refill  . aspirin 81 MG tablet Take 81 mg by mouth daily.     No current facility-administered medications on file prior to visit.    BP 118/70 mmHg  Pulse 80  Ht 5\' 9"  (1.753 m)  Wt  193 lb 12.8 oz (87.907 kg)  BMI 28.61 kg/m2chart    Objective:   Physical Exam  Constitutional: He is oriented to person, place, and time. He appears well-developed and well-nourished.  HENT:  Right Ear: External ear normal.  Left Ear: External ear normal.  Nose: Nose normal.  Mouth/Throat: Oropharynx is clear and moist.  Eyes: Conjunctivae are normal. Pupils are equal, round, and reactive to light.  Neck: Normal range of motion. Neck supple. No thyromegaly present.  Cardiovascular: Normal rate, regular rhythm and normal heart sounds.   Pulmonary/Chest: Effort normal and breath sounds normal.  Abdominal: Soft. Bowel sounds are normal.  Musculoskeletal: Normal range of motion.  Neurological: He is alert and oriented to person, place, and time. He has normal reflexes.  Skin: Skin is warm and dry.  Psychiatric: He has a normal mood and affect.          Assessment & Plan:  Brian Mullen was seen today for follow-up.  Diagnoses and associated orders for this visit:  Essential hypertension - Basic Metabolic Panel - Hepatic Function Panel  Type 2 diabetes mellitus, uncontrolled - Hemoglobin A1c - Basic Metabolic Panel - Hepatic Function Panel - CBC with Differential  Pure hypercholesterolemia - Basic Metabolic Panel - Hepatic Function Panel - CBC with Differential  COPD bronchitis - CBC with Differential  Need for  prophylactic vaccination against Streptococcus pneumoniae (pneumococcus) - Pneumococcal polysaccharide vaccine 23-valent greater than or equal to 2yo subcutaneous/IM  Osteoarthrosis, unspecified whether generalized or localized, involving lower leg  Other Orders - albuterol (PROVENTIL HFA;VENTOLIN HFA) 108 (90 BASE) MCG/ACT inhaler; Inhale 2 puffs into the lungs every 6 (six) hours as needed for wheezing or shortness of breath. - Fluticasone-Salmeterol (ADVAIR) 500-50 MCG/DOSE AEPB; Inhale 1 puff into the lungs every 12 (twelve) hours. - lisinopril  (PRINIVIL,ZESTRIL) 20 MG tablet; Take 1 tablet (20 mg total) by mouth daily. - metoprolol succinate (TOPROL-XL) 50 MG 24 hr tablet; Take 1 tablet (50 mg total) by mouth daily. - metFORMIN (GLUCOPHAGE) 500 MG tablet; Take 2 tablets (1,000 mg total) by mouth 2 (two) times daily with a meal. - montelukast (SINGULAIR) 10 MG tablet; Take 1 tablet (10 mg total) by mouth at bedtime. - tiotropium (SPIRIVA) 18 MCG inhalation capsule; Place 1 capsule (18 mcg total) into inhaler and inhale daily. - diclofenac (VOLTAREN) 75 MG EC tablet; Take 1 tablet (75 mg total) by mouth 2 (two) times daily.     Encouraged a healthy diet, consistently check blood glucose, and exercise. Encouraged medication compliance. Diclofenac to be taken with food as needed. Do not combine with any other anti-inflammatory medications. Follow-up pending labs, in 3-4 months and sooner as needed.

## 2014-10-29 NOTE — Patient Instructions (Signed)
Diabetes and Standards of Medical Care Diabetes is complicated. You may find that your diabetes team includes a dietitian, nurse, diabetes educator, eye doctor, and more. To help everyone know what is going on and to help you get the care you deserve, the following schedule of care was developed to help keep you on track. Below are the tests, exams, vaccines, medicines, education, and plans you will need. HbA1c test This test shows how well you have controlled your glucose over the past 2-3 months. It is used to see if your diabetes management plan needs to be adjusted.   It is performed at least 2 times a year if you are meeting treatment goals.  It is performed 4 times a year if therapy has changed or if you are not meeting treatment goals. Blood pressure test  This test is performed at every routine medical visit. The goal is less than 140/90 mm Hg for most people, but 130/80 mm Hg in some cases. Ask your health care provider about your goal. Dental exam  Follow up with the dentist regularly. Eye exam  If you are diagnosed with type 1 diabetes as a child, get an exam upon reaching the age of 37 years or older and have had diabetes for 3-5 years. Yearly eye exams are recommended after that initial eye exam.  If you are diagnosed with type 1 diabetes as an adult, get an exam within 5 years of diagnosis and then yearly.  If you are diagnosed with type 2 diabetes, get an exam as soon as possible after the diagnosis and then yearly. Foot care exam  Visual foot exams are performed at every routine medical visit. The exams check for cuts, injuries, or other problems with the feet.  A comprehensive foot exam should be done yearly. This includes visual inspection as well as assessing foot pulses and testing for loss of sensation.  Check your feet nightly for cuts, injuries, or other problems with your feet. Tell your health care provider if anything is not healing. Kidney function test (urine  microalbumin)  This test is performed once a year.  Type 1 diabetes: The first test is performed 5 years after diagnosis.  Type 2 diabetes: The first test is performed at the time of diagnosis.  A serum creatinine and estimated glomerular filtration rate (eGFR) test is done once a year to assess the level of chronic kidney disease (CKD), if present. Lipid profile (cholesterol, HDL, LDL, triglycerides)  Performed every 5 years for most people.  The goal for LDL is less than 100 mg/dL. If you are at high risk, the goal is less than 70 mg/dL.  The goal for HDL is 40 mg/dL-50 mg/dL for men and 50 mg/dL-60 mg/dL for women. An HDL cholesterol of 60 mg/dL or higher gives some protection against heart disease.  The goal for triglycerides is less than 150 mg/dL. Influenza vaccine, pneumococcal vaccine, and hepatitis B vaccine  The influenza vaccine is recommended yearly.  It is recommended that people with diabetes who are over 24 years old get the pneumonia vaccine. In some cases, two separate shots may be given. Ask your health care provider if your pneumonia vaccination is up to date.  The hepatitis B vaccine is also recommended for adults with diabetes. Diabetes self-management education  Education is recommended at diagnosis and ongoing as needed. Treatment plan  Your treatment plan is reviewed at every medical visit. Document Released: 08/30/2009 Document Revised: 03/19/2014 Document Reviewed: 04/04/2013 [facility] Patient Information 2015 [place],  [employer]. This information is not intended to replace advice given to you by your health care provider. Make sure you discuss any questions you have with your health care provider.  

## 2014-10-29 NOTE — Progress Notes (Signed)
Pre visit review using our clinic review tool, if applicable. No additional management support is needed unless otherwise documented below in the visit note. 

## 2014-10-30 ENCOUNTER — Other Ambulatory Visit: Payer: Self-pay | Admitting: Family

## 2014-10-30 ENCOUNTER — Telehealth: Payer: Self-pay | Admitting: Family

## 2014-10-30 MED ORDER — SITAGLIPTIN PHOSPHATE 100 MG PO TABS: 100.0000 mg | ORAL_TABLET | Freq: Every day | ORAL | Status: DC

## 2014-10-30 MED ORDER — BLOOD GLUCOSE TEST VI STRP: ORAL_STRIP | Status: DC

## 2014-10-30 MED ORDER — WALGREENS LANCETS MISC: Status: DC

## 2014-10-30 NOTE — Telephone Encounter (Signed)
Orders for supplies sent to pharmacy with note advising to please provide most cost effective meter

## 2014-10-30 NOTE — Telephone Encounter (Signed)
emmi emailed °

## 2014-10-30 NOTE — Telephone Encounter (Signed)
Brian Mullen from [facility] in [place] called to say that the pt said Oran Reinadonda wanted him to found out what kind meters that have.   [phone]  [facility] true track Contour One touch acucheck  Lanora Manislizabeth said you can write a script for the supplies and they would give him the most affordable one.

## 2015-03-01 ENCOUNTER — Ambulatory Visit: Payer: Medicare Other | Admitting: Family

## 2015-03-14 ENCOUNTER — Ambulatory Visit (INDEPENDENT_AMBULATORY_CARE_PROVIDER_SITE_OTHER): Payer: Medicare HMO | Admitting: Primary Care

## 2015-03-14 ENCOUNTER — Encounter: Payer: Self-pay | Admitting: Primary Care

## 2015-03-14 VITALS — BP 120/64 | HR 70 | Temp 97.4°F | Ht 69.0 in | Wt 197.4 lb

## 2015-03-14 DIAGNOSIS — I10 Essential (primary) hypertension: Secondary | ICD-10-CM

## 2015-03-14 DIAGNOSIS — E1165 Type 2 diabetes mellitus with hyperglycemia: Secondary | ICD-10-CM | POA: Diagnosis not present

## 2015-03-14 DIAGNOSIS — M5416 Radiculopathy, lumbar region: Secondary | ICD-10-CM

## 2015-03-14 DIAGNOSIS — M5441 Lumbago with sciatica, right side: Secondary | ICD-10-CM

## 2015-03-14 DIAGNOSIS — J45909 Unspecified asthma, uncomplicated: Secondary | ICD-10-CM

## 2015-03-14 DIAGNOSIS — E7800 Pure hypercholesterolemia, unspecified: Secondary | ICD-10-CM

## 2015-03-14 DIAGNOSIS — IMO0002 Reserved for concepts with insufficient information to code with codable children: Secondary | ICD-10-CM

## 2015-03-14 NOTE — Assessment & Plan Note (Signed)
Lab Results  Component Value Date   HGBA1C 7.4* 10/29/2014   Meeting goal of less <8. Needs urine microalbumin and updated A1C. Follow up in June for both.

## 2015-03-14 NOTE — Progress Notes (Signed)
Subjective:    Patient ID: Brian Mullen, male    DOB: [birth year], 69 y.o.   MRN: [id number]  HPI  Brian Mullen is a 69 year old male who presents today to establish care and discuss the problems mentioned below. [facility].  1) Diabetes: Diagnosed in 2012. Currently taking metformin 2000mg  daily. Last A1C was 7.4 in December 2015. He never refilled his Januvia in January when it was due to be filled. His does not follow a diabetic diet. Diet currently consists of chicken, occasional steak, pizza, mac and cheese, cookies. He does not eat fruits and vegetables. Drinks mainly water, G2 gatorade, diet cheerwine, mellow yellow zero, adds some sugar in his tea. He does not currently exercise.  2) Asthma: Stopped smoking in 1969. He's using the Advair and Spiriva during seasonal changes which is when he has more symptoms. Also has albuterol inhaler for which he's currently using twice a week during the spring season. Does not experience night awakenings, wheezing, cough. Also taking daily singulair.  3) Hypertension: Currently taking lisinopril 20mg  for elevated blood pressure and metoprolol succinate 50 mg for heart rate control. Denies chest pain, shortness of breath, headaches.   BP Readings from Last 3 Encounters:  03/14/15 120/64  10/29/14 118/70  09/22/13 120/80    4) Back pain: Brian Mullen backwards from an 8 foot elevation in 2001. He's had chronic back, hip, knee, and neck pain. He's following with  a chiropractor who recommends an MRI. Xrays were completed in 2014 which showed spondylosis mostly at the L5-S1 level. Pain is mostly to lower back, left hip, and knee pain. Occasional numbness to right leg. He has taken Advil in the past but is now taking diclofenac 75 mg daily. Overall his low back pain is worsening and he's been limited to his activities lately.   Review of Systems  Constitutional: Negative for unexpected weight change.  HENT: Negative for rhinorrhea.     Respiratory: Negative for shortness of breath.   Cardiovascular: Negative for chest pain and leg swelling.  Gastrointestinal: Negative for diarrhea and constipation.  Genitourinary: Negative for dysuria, frequency and difficulty urinating.  Musculoskeletal: Positive for back pain and arthralgias.  Skin: Negative for rash.  Allergic/Immunologic: Positive for environmental allergies.  Neurological: Negative for headaches.       Dizziness with fast postural changes.  Hematological: Negative for adenopathy.  Psychiatric/Behavioral:       Denies concerns for anxiety or depression.       Past Medical History  Diagnosis Date  . Asthma   . GERD (gastroesophageal reflux disease)   . Hypertension   . Allergy   . Diabetes mellitus without complication     History   Social History  . Marital Status: Married    Spouse Name: N/A  . Number of Children: N/A  . Years of Education: N/A   Occupational History  . Not on file.   Social History Main Topics  . Smoking status: Never Smoker   . Smokeless tobacco: Not on file  . Alcohol Use: 0.0 oz/week    0 Standard drinks or equivalent per week     Comment: occ  . Drug Use: No  . Sexual Activity: Not on file   Other Topics Concern  . Not on file   Social History Narrative   Married.   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   Enjoys working on [employer] house, yard work, gardening.  Past Surgical History  Procedure Laterality Date  . Tonsillectomy    . Elbow surgery    . Knee surgery Right     Family History  Problem Relation Age of Onset  . Pulmonary embolism Father 57    Deceased  . Heart attack Mother 46    Deceased  . Diabetes Maternal Grandmother   . Heart attack Sister     Allergies  Allergen Reactions  . Coconut Oil Anaphylaxis  . Iodine Anaphylaxis  . Shellfish Allergy Anaphylaxis    Current Outpatient Prescriptions on File Prior to Visit  Medication Sig Dispense Refill  .  albuterol (PROVENTIL HFA;VENTOLIN HFA) 108 (90 BASE) MCG/ACT inhaler Inhale 2 puffs into the lungs every 6 (six) hours as needed for wheezing or shortness of breath. 1 Inhaler 3  . aspirin 81 MG tablet Take 81 mg by mouth daily.    . diclofenac (VOLTAREN) 75 MG EC tablet Take 1 tablet (75 mg total) by mouth 2 (two) times daily. 60 tablet 3  . Fluticasone-Salmeterol (ADVAIR) 500-50 MCG/DOSE AEPB Inhale 1 puff into the lungs every 12 (twelve) hours. 60 each 3  . Glucose Blood (BLOOD GLUCOSE TEST STRIPS) STRP USE TO CHECK BLOOD SUGAR TWICE DAILY 100 each 2  . lisinopril (PRINIVIL,ZESTRIL) 20 MG tablet Take 1 tablet (20 mg total) by mouth daily. 30 tablet 3  . metFORMIN (GLUCOPHAGE) 500 MG tablet Take 2 tablets (1,000 mg total) by mouth 2 (two) times daily with a meal. 120 tablet 3  . metoprolol succinate (TOPROL-XL) 50 MG 24 hr tablet Take 1 tablet (50 mg total) by mouth daily. 30 tablet 3  . montelukast (SINGULAIR) 10 MG tablet Take 1 tablet (10 mg total) by mouth at bedtime. 30 tablet 3  . tiotropium (SPIRIVA) 18 MCG inhalation capsule Place 1 capsule (18 mcg total) into inhaler and inhale daily. 30 capsule 3  . [employer] LANCETS MISC Use to check blood sugar twice daily. 100 each 2  . sitaGLIPtin (JANUVIA) 100 MG tablet Take 1 tablet (100 mg total) by mouth daily. (Patient not taking: Reported on 03/14/2015) 30 tablet 3   No current facility-administered medications on file prior to visit.    BP 120/64 mmHg  Pulse 70  Temp(Src) 97.4 F (36.3 C) (Oral)  Ht  (1.753 m)  Wt 197 lb 6.4 oz (89.54 kg)  BMI 29.14 kg/m2  SpO2 96%    Objective:   Physical Exam  Constitutional: He is oriented to person, place, and time. He appears well-developed.  HENT:  Nose: Nose normal.  Mouth/Throat: Oropharynx is clear and moist.  Eyes: Conjunctivae and EOM are normal. Pupils are equal, round, and reactive to light.  Neck: Neck supple.  Cardiovascular: Normal rate and regular rhythm.     Pulmonary/Chest: Effort normal and breath sounds normal. He has no wheezes.  Abdominal: Soft. Bowel sounds are normal.  Lymphadenopathy:    He has no cervical adenopathy.  Neurological: He is alert and oriented to person, place, and time. He displays abnormal reflex.  Skin: Skin is warm and dry.  Psychiatric: He has a normal mood and affect.          Assessment & Plan:

## 2015-03-14 NOTE — Patient Instructions (Addendum)
Follow up in mid to late June for follow up of diabetes. Come fasting so we can check your cholesterol.  You will be contacted regarding your MRI. Please notify them that you require sedation. It was a pleasure to meet you today! Please don't hesitate to call me with any questions. Welcome to [facility]!

## 2015-03-14 NOTE — Assessment & Plan Note (Signed)
Stable on lisinopril and Toprol XL. Denies chest pain, cough, headaches, lower extremity edema. Will continue to monitor.

## 2015-03-14 NOTE — Assessment & Plan Note (Signed)
Infrequently using Advair and Spiriva as needed during seasonal changes. Instructed patient that he may need to be re-evaluated for his asthma severity. Uses albuterol inhaler twice weekly at the moment. Denies night awakenings or cough No wheezing or cough on exam. Will re-evaluate his severity at a future appointment.

## 2015-03-14 NOTE — Progress Notes (Signed)
Pre visit review using our clinic review tool, if applicable. No additional management support is needed unless otherwise documented below in the visit note. 

## 2015-03-14 NOTE — Assessment & Plan Note (Signed)
Unhealthy diet, does not exercise. Recheck lipids fasting next visit.

## 2015-03-14 NOTE — Assessment & Plan Note (Signed)
Worsening. Referral made for MRI. Xrays in 2014 showed spondylosis especially L5-S1. Follows with his chiropractor.

## 2015-04-10 ENCOUNTER — Other Ambulatory Visit: Payer: Self-pay | Admitting: Primary Care

## 2015-04-10 ENCOUNTER — Telehealth: Payer: Self-pay | Admitting: Primary Care

## 2015-04-10 NOTE — Telephone Encounter (Signed)
Attempted to call patient to notify him of the importance of getting his MRI scheduled. Our referral coordinator has attempted to contact patient multiple times without success. Will bring up during next appointment.

## 2015-05-03 ENCOUNTER — Other Ambulatory Visit: Payer: Self-pay | Admitting: Family

## 2015-05-03 NOTE — Telephone Encounter (Addendum)
Electronically request refills.  Was last seen on 03/14/15. Next apt is 05/14/15.

## 2015-05-14 ENCOUNTER — Ambulatory Visit: Payer: Medicare HMO | Admitting: Primary Care

## 2015-05-16 ENCOUNTER — Telehealth: Payer: Self-pay | Admitting: Family

## 2015-05-16 ENCOUNTER — Other Ambulatory Visit: Payer: Self-pay | Admitting: *Deleted

## 2015-05-16 MED ORDER — LISINOPRIL 20 MG PO TABS: 20.0000 mg | ORAL_TABLET | Freq: Every day | ORAL | Status: DC

## 2015-06-07 NOTE — Telephone Encounter (Signed)
I left a message for the patient to return my call.

## 2015-06-07 NOTE — Telephone Encounter (Signed)
Will you please call Brian Mullen to schedule a follow up appointment? It appears that he may have transferred to [facility] office, if so will you notify the front office, and tell Brian Mullen that it was a pleasure meeting him? Thanks.

## 2015-06-28 ENCOUNTER — Other Ambulatory Visit: Payer: Self-pay | Admitting: Primary Care

## 2015-07-01 ENCOUNTER — Other Ambulatory Visit: Payer: Self-pay | Admitting: Primary Care

## 2015-07-01 NOTE — Telephone Encounter (Signed)
Electronically refill request  Last prescribed on 05/03/2015   montelukast (SINGULAIR) 10 MG tablet Dispense: 30 tablet   Refills: 0   metoprolol succinate (TOPROL-XL) 50 MG 24 hr tablet Dispense: 30 tablet   Refills: 0    Last seen on 03/14/15. No show on 05/14/15. No future apt.

## 2015-07-01 NOTE — Telephone Encounter (Signed)
Electronically refill request  Last prescribed on 05/16/2015  lisinopril (PRINIVIL,ZESTRIL) 20 MG tablet  Dispense: 30 tablet   Refills: 0   Patient is request 90 days supply.  Last seen on 03/14/15. No show on 05/14/15. No future apt.

## 2015-07-11 ENCOUNTER — Ambulatory Visit (INDEPENDENT_AMBULATORY_CARE_PROVIDER_SITE_OTHER): Payer: Medicare HMO | Admitting: Primary Care

## 2015-07-11 ENCOUNTER — Encounter: Payer: Self-pay | Admitting: Primary Care

## 2015-07-11 VITALS — BP 118/62 | HR 78 | Temp 97.8°F | Ht 69.0 in | Wt 193.4 lb

## 2015-07-11 DIAGNOSIS — I10 Essential (primary) hypertension: Secondary | ICD-10-CM | POA: Diagnosis not present

## 2015-07-11 DIAGNOSIS — IMO0002 Reserved for concepts with insufficient information to code with codable children: Secondary | ICD-10-CM

## 2015-07-11 DIAGNOSIS — E119 Type 2 diabetes mellitus without complications: Secondary | ICD-10-CM | POA: Diagnosis not present

## 2015-07-11 DIAGNOSIS — E1165 Type 2 diabetes mellitus with hyperglycemia: Secondary | ICD-10-CM

## 2015-07-11 DIAGNOSIS — M5416 Radiculopathy, lumbar region: Secondary | ICD-10-CM

## 2015-07-11 DIAGNOSIS — M62830 Muscle spasm of back: Secondary | ICD-10-CM | POA: Diagnosis not present

## 2015-07-11 DIAGNOSIS — R0789 Other chest pain: Secondary | ICD-10-CM

## 2015-07-11 LAB — BASIC METABOLIC PANEL
BUN: 19 mg/dL (ref 6–23)
CO2: 30 mEq/L (ref 19–32)
Calcium: 9.3 mg/dL (ref 8.4–10.5)
Chloride: 101 mEq/L (ref 96–112)
Creatinine, Ser: 1.07 mg/dL (ref 0.40–1.50)
GFR: 72.72 mL/min (ref >60.00)
Glucose, Bld: 136 mg/dL — ABNORMAL HIGH (ref 70–99)
Potassium: 4.7 mEq/L (ref 3.5–5.1)
Sodium: 137 mEq/L (ref 135–145)

## 2015-07-11 LAB — HEMOGLOBIN A1C: Hgb A1c MFr Bld: 6.9 % — ABNORMAL HIGH (ref 4.6–6.5)

## 2015-07-11 MED ORDER — CYCLOBENZAPRINE HCL 5 MG PO TABS: 5.0000 mg | ORAL_TABLET | Freq: Every evening | ORAL | Status: DC | PRN

## 2015-07-11 MED ORDER — LOSARTAN POTASSIUM 25 MG PO TABS: 25.0000 mg | ORAL_TABLET | Freq: Every day | ORAL | Status: DC

## 2015-07-11 NOTE — Assessment & Plan Note (Signed)
Chronic back pain, worse over past several weeks. Heavy lifting with cement blocks recently. Also describes tight band around his midsection above abdomen. ECG checked. Will start flexeril PRN HS for acute suspected muscle spasm. He was to have an MRI of LS but declined.

## 2015-07-11 NOTE — Progress Notes (Signed)
Pre visit review using our clinic review tool, if applicable. No additional management support is needed unless otherwise documented below in the visit note. 

## 2015-07-11 NOTE — Assessment & Plan Note (Signed)
Stale today, has not been taking meds consistently. Noticed a dry cough when starting back on lisinopril. Will switch to losartan 25 mg as he is diabetic. Will continue to monitor.

## 2015-07-11 NOTE — Progress Notes (Signed)
Subjective:    Patient ID: Brian Mullen, male    DOB: 08-30-1946, 69 y.o.   MRN: [id number]  HPI  Brian Mullen is a 69 year old male who presents today with a chief complaint of muscle cramping. His cramping is located to his back. Sunday last weekend he was resting on the couch and was unable to sit up due to discomfort to his back, and experienced a band like pressure that was present around his upper body. Also with some lateral trunk cramping/spasm. Denies fatigue, shortness of breath, dizziness. He's been lifting heavy cement blocks to build a wall around the house. His pain is present with movement and will feel better gradually as he becomes active, worse with resting for a period of time. He's been taking advil without relief.   2) Diabetes type 2: Currently managed on Metformin 2000 mg daily. Last A1C was 7.4 in December 2015. He is not taking his medication consistently and endorses a poor diet.   Diet consists of: Breakfast:Skips, cheese and eggs, toast, bagle Lunch: Sandwich, pizza, limited fast food Snack: Chips, cookies, occasional candy bars Dinner: Mac and cheese, chicken. Limited vegetables. Desserts: Occasionally Beverages: Drinking diet soda, G2 Gatorade, limited water.  3) Hypertension: Currently managed on lisinopril 20 mg and metoprolol XL 50 mg. He has not been taking consistently. Denies chest pain, SOB, headaches. He does have a dry tickle/cough when he goes back on his lisinopril.   Review of Systems  Respiratory: Negative for shortness of breath.   Cardiovascular: Negative for chest pain.  Musculoskeletal: Positive for myalgias, back pain, arthralgias and neck stiffness.  Neurological: Positive for numbness. Negative for dizziness and headaches.       Past Medical History  Diagnosis Date  . Asthma   . GERD (gastroesophageal reflux disease)   . Hypertension   . Allergy   . Diabetes mellitus without complication     Social History   Social History  .  Marital Status: Married    Spouse Name: N/A  . Number of Children: N/A  . Years of Education: N/A   Occupational History  . Not on file.   Social History Main Topics  . Smoking status: Never Smoker   . Smokeless tobacco: Not on file  . Alcohol Use: 0.0 oz/week    0 Standard drinks or equivalent per week     Comment: occ  . Drug Use: No  . Sexual Activity: Not on file   Other Topics Concern  . Not on file   Social History Narrative   Married.   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   Enjoys working on [employer], yard work, gardening.       Past Surgical History  Procedure Laterality Date  . Tonsillectomy    . Elbow surgery    . Knee surgery Right     Family History  Problem Relation Age of Onset  . Pulmonary embolism Father 21    Deceased  . Heart attack Mother 67    Deceased  . Diabetes Maternal Grandmother   . Heart attack Sister     Allergies  Allergen Reactions  . Coconut Oil Anaphylaxis  . Iodine Anaphylaxis  . Shellfish Allergy Anaphylaxis    Current Outpatient Prescriptions on File Prior to Visit  Medication Sig Dispense Refill  . albuterol (PROVENTIL HFA;VENTOLIN HFA) 108 (90 BASE) MCG/ACT inhaler Inhale 2 puffs into the lungs every 6 (six) hours as needed for wheezing or shortness  of breath. 1 Inhaler 3  . aspirin 81 MG tablet Take 81 mg by mouth daily.    . diclofenac (VOLTAREN) 75 MG EC tablet Take 1 tablet (75 mg total) by mouth 2 (two) times daily. 60 tablet 3  . Fluticasone-Salmeterol (ADVAIR) 500-50 MCG/DOSE AEPB Inhale 1 puff into the lungs every 12 (twelve) hours. 60 each 3  . Glucose Blood (BLOOD GLUCOSE TEST STRIPS) STRP USE TO CHECK BLOOD SUGAR TWICE DAILY 100 each 2  . metFORMIN (GLUCOPHAGE) 500 MG tablet TAKE 2 TABLETS BY MOUTH TWICE DAILY WITH A MEAL 120 tablet 0  . metoprolol succinate (TOPROL-XL) 50 MG 24 hr tablet TAKE 1 TABLET BY MOUTH EVERY DAY 30 tablet 0  . montelukast (SINGULAIR) 10 MG tablet TAKE 1  TABLET BY MOUTH EVERY NIGHT AT BEDTIME 30 tablet 0  . tiotropium (SPIRIVA) 18 MCG inhalation capsule Place 1 capsule (18 mcg total) into inhaler and inhale daily. 30 capsule 3  . [employer] LANCETS MISC Use to check blood sugar twice daily. 100 each 2  . sitaGLIPtin (JANUVIA) 100 MG tablet Take 1 tablet (100 mg total) by mouth daily. (Patient not taking: Reported on 03/14/2015) 30 tablet 3   No current facility-administered medications on file prior to visit.    BP 118/62 mmHg  Pulse 78  Temp(Src) 97.8 F (36.6 C) (Oral)  Ht 5\' 9"  (1.753 m)  Wt 193 lb 6.4 oz (87.726 kg)  BMI 28.55 kg/m2  SpO2 96%    Objective:   Physical Exam  Constitutional: He appears well-nourished.  Cardiovascular: Normal rate and regular rhythm.   Pulmonary/Chest: Effort normal and breath sounds normal.  Abdominal: Soft. Bowel sounds are normal. There is no tenderness.  Musculoskeletal:       Thoracic back: He exhibits decreased range of motion, pain and spasm. He exhibits no tenderness.       Lumbar back: He exhibits decreased range of motion and pain. He exhibits no tenderness.  Negative straight leg raise  Skin: Skin is warm and dry.          Assessment & Plan:  ECG:  NSR rate of 74. New t-wave inversion noted to V2 that was not present on comparison of ECG in 2014. No ST elevation or acute changes.   Due to ECG changes will consider sending to cardiology for further evaluation.

## 2015-07-11 NOTE — Patient Instructions (Addendum)
Complete lab work prior to leaving today. I will notify you of your results.  Stop taking Lisinopril blood pressure medication. Start Losartan 25 mg tablets for blood pressure. Take 1 tablet by mouth daily.  You may take Cyclobenzaprine at bedtime as needed for muscle spasms. Apply heat, stay active without over exertion.   It is important that you improve your diet. Please limit carbohydrates in the form of white bread, rice, pasta, cakes, cookies, sugary drinks, etc. Increase your consumption of fresh fruits and vegetables. Be sure to drink plenty of water daily.  Please schedule a physical with me in the 3 months. You will also schedule a lab only appointment one week prior. We will discuss your lab results during your physical.  It was a pleasure to see you today!  Diabetes Mellitus and Food It is important for you to manage your blood sugar (glucose) level. Your blood glucose level can be greatly affected by what you eat. Eating healthier foods in the appropriate amounts throughout the day at about the same time each day will help you control your blood glucose level. It can also help slow or prevent worsening of your diabetes mellitus. Healthy eating may even help you improve the level of your blood pressure and reach or maintain a healthy weight.  HOW CAN FOOD AFFECT ME? Carbohydrates Carbohydrates affect your blood glucose level more than any other type of food. Your dietitian will help you determine how many carbohydrates to eat at each meal and teach you how to count carbohydrates. Counting carbohydrates is important to keep your blood glucose at a healthy level, especially if you are using insulin or taking certain medicines for diabetes mellitus. Alcohol Alcohol can cause sudden decreases in blood glucose (hypoglycemia), especially if you use insulin or take certain medicines for diabetes mellitus. Hypoglycemia can be a life-threatening condition. Symptoms of hypoglycemia (sleepiness,  dizziness, and disorientation) are similar to symptoms of having too much alcohol.  If your health care provider has given you approval to drink alcohol, do so in moderation and use the following guidelines:  Women should not have more than one drink per day, and men should not have more than two drinks per day. One drink is equal to:  12 oz of beer.  5 oz of wine.  1 oz of hard liquor.  Do not drink on an empty stomach.  Keep yourself hydrated. Have water, diet soda, or unsweetened iced tea.  Regular soda, juice, and other mixers might contain a lot of carbohydrates and should be counted. WHAT FOODS ARE NOT RECOMMENDED? As you make food choices, it is important to remember that all foods are not the same. Some foods have fewer nutrients per serving than other foods, even though they might have the same number of calories or carbohydrates. It is difficult to get your body what it needs when you eat foods with fewer nutrients. Examples of foods that you should avoid that are high in calories and carbohydrates but low in nutrients include:  Trans fats (most processed foods list trans fats on the Nutrition Facts label).  Regular soda.  Juice.  Candy.  Sweets, such as cake, pie, doughnuts, and cookies.  Fried foods. WHAT FOODS CAN I EAT? Have nutrient-rich foods, which will nourish your body and keep you healthy. The food you should eat also will depend on several factors, including:  The calories you need.  The medicines you take.  Your weight.  Your blood glucose level.  Your blood pressure  level.  Your cholesterol level. You also should eat a variety of foods, including:  Protein, such as meat, poultry, fish, tofu, nuts, and seeds (lean animal proteins are best).  Fruits.  Vegetables.  Dairy products, such as milk, cheese, and yogurt (low fat is best).  Breads, grains, pasta, cereal, rice, and beans.  Fats such as olive oil, trans fat-free margarine, canola  oil, avocado, and olives. DOES EVERYONE WITH DIABETES MELLITUS HAVE THE SAME MEAL PLAN? Because every person with diabetes mellitus is different, there is not one meal plan that works for everyone. It is very important that you meet with a dietitian who will help you create a meal plan that is just right for you. Document Released: 07/30/2005 Document Revised: 11/07/2013 Document Reviewed: 09/29/2013 [facility] Patient Information 2015 [place], [place]. This information is not intended to replace advice given to you by your health care provider. Make sure you discuss any questions you have with your health care provider.

## 2015-07-11 NOTE — Assessment & Plan Note (Signed)
Check A1C today. Has not been taking metformin consistently. Poor diet. Will switch from ACE to ARB as the ACE causes cough. Discussed the importance of healthy diet and exercise. Will closely follow.

## 2015-07-12 ENCOUNTER — Other Ambulatory Visit: Payer: Self-pay | Admitting: Primary Care

## 2015-07-12 DIAGNOSIS — M62830 Muscle spasm of back: Secondary | ICD-10-CM

## 2015-07-12 MED ORDER — TIZANIDINE HCL 2 MG PO TABS: 2.0000 mg | ORAL_TABLET | Freq: Every evening | ORAL | Status: DC | PRN

## 2015-07-15 ENCOUNTER — Encounter: Payer: Self-pay | Admitting: *Deleted

## 2015-09-02 ENCOUNTER — Telehealth: Payer: Self-pay | Admitting: Primary Care

## 2015-09-02 NOTE — Telephone Encounter (Signed)
Called and spoken to patient. Patient stated he already refill the lisinopril before Jae DireKate change the medication. However, patient is just taking losartan.

## 2015-09-02 NOTE — Telephone Encounter (Signed)
Will you please ensure that Brian Mullen is not taking Losartan and Lisinopril for blood pressure? He is to be taking Losartan and metoprolol succinate. I received a report from his insurance company stating that he'd filled both the lisinopril and losartan. He should not be taking both of those medications, just the losartan. Thanks.

## 2015-09-02 NOTE — Telephone Encounter (Signed)
Noted  

## 2015-09-03 ENCOUNTER — Other Ambulatory Visit: Payer: Self-pay | Admitting: Primary Care

## 2015-09-03 DIAGNOSIS — J45909 Unspecified asthma, uncomplicated: Secondary | ICD-10-CM

## 2015-09-03 DIAGNOSIS — I10 Essential (primary) hypertension: Secondary | ICD-10-CM

## 2015-09-04 NOTE — Telephone Encounter (Signed)
Electronically refill request for  metoprolol succinate (TOPROL-XL) 50 MG 24 hr tablet   TAKE 1 TABLET BY MOUTH EVERY DAY  Dispense: 30 tablet   Refills: 0     montelukast (SINGULAIR) 10 MG tablet   TAKE 1 TABLET BY MOUTH EVERY NIGHT AT BEDTIME  Dispense: 30 tablet   Refills: 0     Both Rx were prescribed on 07/01/2015. Last seen on 07/11/2015. Next appt 10/07/2015

## 2015-09-13 DIAGNOSIS — M9902 Segmental and somatic dysfunction of thoracic region: Secondary | ICD-10-CM | POA: Diagnosis not present

## 2015-09-13 DIAGNOSIS — M9903 Segmental and somatic dysfunction of lumbar region: Secondary | ICD-10-CM | POA: Diagnosis not present

## 2015-09-13 DIAGNOSIS — M546 Pain in thoracic spine: Secondary | ICD-10-CM | POA: Diagnosis not present

## 2015-09-13 DIAGNOSIS — M545 Low back pain: Secondary | ICD-10-CM | POA: Diagnosis not present

## 2015-09-18 DIAGNOSIS — M545 Low back pain: Secondary | ICD-10-CM | POA: Diagnosis not present

## 2015-09-18 DIAGNOSIS — M546 Pain in thoracic spine: Secondary | ICD-10-CM | POA: Diagnosis not present

## 2015-09-18 DIAGNOSIS — M9902 Segmental and somatic dysfunction of thoracic region: Secondary | ICD-10-CM | POA: Diagnosis not present

## 2015-09-18 DIAGNOSIS — M9903 Segmental and somatic dysfunction of lumbar region: Secondary | ICD-10-CM | POA: Diagnosis not present

## 2015-09-23 ENCOUNTER — Other Ambulatory Visit: Payer: Self-pay | Admitting: Primary Care

## 2015-09-23 ENCOUNTER — Other Ambulatory Visit (INDEPENDENT_AMBULATORY_CARE_PROVIDER_SITE_OTHER): Payer: Medicare HMO

## 2015-09-23 DIAGNOSIS — Z125 Encounter for screening for malignant neoplasm of prostate: Secondary | ICD-10-CM

## 2015-09-23 DIAGNOSIS — I10 Essential (primary) hypertension: Secondary | ICD-10-CM

## 2015-09-23 DIAGNOSIS — E785 Hyperlipidemia, unspecified: Secondary | ICD-10-CM

## 2015-09-23 LAB — LIPID PANEL
Cholesterol: 184 mg/dL (ref 0–200)
HDL: 43.6 mg/dL (ref >39.00)
LDL Cholesterol: 112 mg/dL — ABNORMAL HIGH (ref 0–99)
NonHDL: 140.38
Total CHOL/HDL Ratio: 4
Triglycerides: 142 mg/dL (ref 0.0–149.0)
VLDL: 28.4 mg/dL (ref 0.0–40.0)

## 2015-09-23 LAB — COMPREHENSIVE METABOLIC PANEL
ALT: 10 U/L (ref 0–53)
AST: 15 U/L (ref 0–37)
Albumin: 4.1 g/dL (ref 3.5–5.2)
Alkaline Phosphatase: 48 U/L (ref 39–117)
BUN: 12 mg/dL (ref 6–23)
CO2: 31 mEq/L (ref 19–32)
Calcium: 9.6 mg/dL (ref 8.4–10.5)
Chloride: 103 mEq/L (ref 96–112)
Creatinine, Ser: 0.97 mg/dL (ref 0.40–1.50)
GFR: 81.39 mL/min (ref >60.00)
Glucose, Bld: 133 mg/dL — ABNORMAL HIGH (ref 70–99)
Potassium: 4.9 mEq/L (ref 3.5–5.1)
Sodium: 139 mEq/L (ref 135–145)
Total Bilirubin: 0.5 mg/dL (ref 0.2–1.2)
Total Protein: 6.4 g/dL (ref 6.0–8.3)

## 2015-09-23 LAB — PSA, MEDICARE: PSA: 3 ng/ml (ref 0.10–4.00)

## 2015-09-30 ENCOUNTER — Other Ambulatory Visit: Payer: Medicare HMO

## 2015-10-07 ENCOUNTER — Ambulatory Visit (INDEPENDENT_AMBULATORY_CARE_PROVIDER_SITE_OTHER): Payer: Medicare HMO | Admitting: Primary Care

## 2015-10-07 ENCOUNTER — Encounter: Payer: Self-pay | Admitting: Primary Care

## 2015-10-07 VITALS — BP 122/78 | HR 78 | Temp 97.8°F | Ht 69.0 in | Wt 196.0 lb

## 2015-10-07 DIAGNOSIS — G4700 Insomnia, unspecified: Secondary | ICD-10-CM

## 2015-10-07 DIAGNOSIS — Z0000 Encounter for general adult medical examination without abnormal findings: Secondary | ICD-10-CM

## 2015-10-07 DIAGNOSIS — E1165 Type 2 diabetes mellitus with hyperglycemia: Secondary | ICD-10-CM

## 2015-10-07 DIAGNOSIS — F5104 Psychophysiologic insomnia: Secondary | ICD-10-CM | POA: Insufficient documentation

## 2015-10-07 DIAGNOSIS — E7800 Pure hypercholesterolemia, unspecified: Secondary | ICD-10-CM

## 2015-10-07 DIAGNOSIS — I10 Essential (primary) hypertension: Secondary | ICD-10-CM | POA: Diagnosis not present

## 2015-10-07 DIAGNOSIS — E785 Hyperlipidemia, unspecified: Secondary | ICD-10-CM | POA: Diagnosis not present

## 2015-10-07 DIAGNOSIS — G479 Sleep disorder, unspecified: Secondary | ICD-10-CM | POA: Insufficient documentation

## 2015-10-07 DIAGNOSIS — J45909 Unspecified asthma, uncomplicated: Secondary | ICD-10-CM

## 2015-10-07 DIAGNOSIS — IMO0001 Reserved for inherently not codable concepts without codable children: Secondary | ICD-10-CM

## 2015-10-07 DIAGNOSIS — E119 Type 2 diabetes mellitus without complications: Secondary | ICD-10-CM

## 2015-10-07 DIAGNOSIS — Z136 Encounter for screening for cardiovascular disorders: Secondary | ICD-10-CM | POA: Insufficient documentation

## 2015-10-07 LAB — MICROALBUMIN / CREATININE URINE RATIO
Creatinine,U: 52.1 mg/dL
Microalb Creat Ratio: 1.3 mg/g (ref 0.0–30.0)
Microalb, Ur: <0.7 mg/dL (ref 0.0–1.9)

## 2015-10-07 LAB — HEMOGLOBIN A1C: Hgb A1c MFr Bld: 6.8 % — ABNORMAL HIGH (ref 4.6–6.5)

## 2015-10-07 MED ORDER — SIMVASTATIN 10 MG PO TABS: 10.0000 mg | ORAL_TABLET | Freq: Every day | ORAL | Status: DC

## 2015-10-07 NOTE — Progress Notes (Signed)
Patient ID: Brian Mullen, male   DOB: 18-Oct-1946, 69 y.o.   MRN: [id number]  HPI: [name] is a 69 year old male who presents today for Medicare Wellness Visit.  Past Medical History  Diagnosis Date  . Asthma   . GERD (gastroesophageal reflux disease)   . Hypertension   . Allergy   . Diabetes mellitus without complication [facility](HCC)     Current Outpatient Prescriptions  Medication Sig Dispense Refill  . albuterol (PROVENTIL HFA;VENTOLIN HFA) 108 (90 BASE) MCG/ACT inhaler Inhale 2 puffs into the lungs every 6 (six) hours as needed for wheezing or shortness of breath. 1 Inhaler 3  . aspirin 81 MG tablet Take 81 mg by mouth daily.    . diclofenac (VOLTAREN) 75 MG EC tablet Take 1 tablet (75 mg total) by mouth 2 (two) times daily. 60 tablet 3  . Fluticasone-Salmeterol (ADVAIR) 500-50 MCG/DOSE AEPB Inhale 1 puff into the lungs every 12 (twelve) hours. 60 each 3  . Glucose Blood (BLOOD GLUCOSE TEST STRIPS) STRP USE TO CHECK BLOOD SUGAR TWICE DAILY 100 each 2  . losartan (COZAAR) 25 MG tablet Take 1 tablet (25 mg total) by mouth daily. 30 tablet 5  . metFORMIN (GLUCOPHAGE) 500 MG tablet TAKE 2 TABLETS BY MOUTH TWICE DAILY WITH A MEAL 120 tablet 0  . metoprolol succinate (TOPROL-XL) 50 MG 24 hr tablet TAKE 1 TABLET BY MOUTH EVERY DAY 30 tablet 0  . montelukast (SINGULAIR) 10 MG tablet TAKE 1 TABLET BY MOUTH EVERY NIGHT AT BEDTIME 30 tablet 0  . tiotropium (SPIRIVA) 18 MCG inhalation capsule Place 1 capsule (18 mcg total) into inhaler and inhale daily. 30 capsule 3  . tiZANidine (ZANAFLEX) 2 MG tablet Take 1 tablet (2 mg total) by mouth at bedtime as needed for muscle spasms. 30 tablet 0  . [employer] LANCETS MISC Use to check blood sugar twice daily. 100 each 2   No current facility-administered medications for this visit.    Allergies  Allergen Reactions  . Coconut Oil Anaphylaxis  . Iodine Anaphylaxis  . Shellfish Allergy Anaphylaxis    Family History  Problem Relation Age of Onset  .  Pulmonary embolism Father [id number]    Deceased  . Heart attack Mother [id number]    Deceased  . Diabetes Maternal Grandmother   . Heart attack Sister     Social History   Social History  . Marital Status: Married    Spouse Name: N/A  . Number of Children: N/A  . Years of Education: N/A   Occupational History  . Not on file.   Social History Main Topics  . Smoking status: Never Smoker   . Smokeless tobacco: Not on file  . Alcohol Use: 0.0 oz/week    0 Standard drinks or equivalent per week     Comment: occ  . Drug Use: No  . Sexual Activity: Not on file   Other Topics Concern  . Not on file   Social History Narrative   Married.   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   Enjoys working on [employer] Power[employer] house, yard work, gardening.       Hospitiliaztions: None  Health Maintenance:    Flu: Due today.  Tetanus: Provided in November 2014  Pneumovax: Provided in December 2015  Prevnar: Provided in November 2014  Zostavax: Has not completed.  Colonoscopy: Has not completed.   Eye Doctor: Completed 1 year ago. Due soon.  Dental Exam: Completed years ago.  PSA: Completed  09/2015. Normal.    Providers: [name], PCP; Dr. Ladona Ridgel Chiropractor; [facility] in [place]   I have personally reviewed and have noted: 1. The patient's medical and social history 2. Their use of alcohol, tobacco or illicit drugs 3. Their current medications and supplements 4. The patient's functional ability including ADL's, fall risks, home safety risks  and hearing or visual impairment. 5. Diet and physical activities 6. Evidence for depression or mood disorder  Subjective:   Review of Systems:   Constitutional: Denies fever, malaise, fatigue, headache or abrupt weight changes.  HEENT: Denies eye pain, eye redness, ear pain, wax buildup, runny nose, nasal congestion, bloody nose, or sore throat. He has ringing in the ears that has been present for years. He's never had  evaluation.  Respiratory: Denies difficulty breathing, shortness of breath, cough or sputum production.   Cardiovascular: Denies chest pain, chest tightness, palpitations or swelling in the hands or feet.  Gastrointestinal: Denies abdominal pain, bloating, constipation, diarrhea or blood in the stool.  GU: Denies urgency, frequency, pain with urination, burning sensation, blood in urine, odor or discharge. Musculoskeletal: Denies decrease in range of motion, difficulty with gait, muscle pain or joint pain and swelling. Chronic knee pain, shoulder pain, and back pain from injury in 2001. History of work as a Curator. Skin: Denies redness, rashes, lesions or ulcercations.  Neurological: Denies dizziness, difficulty with memory, difficulty with speech or problems with balance and coordination.   1) Insomnia: Difficulty falling asleep every night. Will wake up 2-3 times nightly every night. He will take tylenol PM occasionally. He falls asleep watching TV as he likes background noise. His last dose of caffeine is typically in the morning.   No other specific complaints in a complete review of systems (except as listed in HPI above).  Objective:  PE:   Ht  (1.753 m)  Wt 196 lb (88.905 kg)  BMI 28.93 kg/m2 Wt Readings from Last 3 Encounters:  10/07/15 196 lb (88.905 kg)  07/11/15 193 lb 6.4 oz (87.726 kg)  03/14/15 197 lb 6.4 oz (89.54 kg)    General: Appears their stated age, well developed, well nourished in NAD. Skin: Warm, dry and intact. No rashes, lesions or ulcerations noted. HEENT: Head: normal shape and size; Eyes: sclera white, no icterus, conjunctiva pink, PERRLA and EOMs intact; Ears: Tm's gray and intact, normal light reflex; Nose: mucosa pink and moist, septum midline; Throat/Mouth: Teeth present, mucosa pink and moist, no exudate, lesions or ulcerations noted.  Neck: Normal range of motion. Neck supple, trachea midline. No massses, lumps or thyromegaly present.   Cardiovascular: Normal rate and rhythm. S1,S2 noted.  No murmur, rubs or gallops noted. No JVD or BLE edema. No carotid bruits noted. Pulmonary/Chest: Normal effort and positive vesicular breath sounds. No respiratory distress. No wheezes, rales or ronchi noted.  Abdomen: Soft and nontender. Normal bowel sounds, no bruits noted. No distention or masses noted. Liver, spleen and kidneys non palpable. Musculoskeletal: Normal range of motion. No signs of joint swelling. No difficulty with gait.  Neurological: Alert and oriented. Cranial nerves II-XII intact. Coordination normal. +DTRs bilaterally. Psychiatric: Mood and affect normal. Behavior is normal. Judgment and thought content normal.   EKG:  BMET    Component Value Date/Time   NA 139 09/23/2015 1532   K 4.9 09/23/2015 1532   CL 103 09/23/2015 1532   CO2 31 09/23/2015 1532   GLUCOSE 133* 09/23/2015 1532   BUN 12 09/23/2015 1532   CREATININE 0.97 09/23/2015  1532   CALCIUM 9.6 09/23/2015 1532    Lipid Panel     Component Value Date/Time   CHOL 184 09/23/2015 1532   TRIG 142.0 09/23/2015 1532   HDL 43.60 09/23/2015 1532   CHOLHDL 4 09/23/2015 1532   VLDL 28.4 09/23/2015 1532   LDLCALC 112* 09/23/2015 1532    CBC    Component Value Date/Time   WBC 11.3* 10/29/2014 1534   RBC 4.90 10/29/2014 1534   HGB 14.4 10/29/2014 1534   HCT 44.4 10/29/2014 1534   PLT 315.0 10/29/2014 1534   MCV 90.7 10/29/2014 1534   MCHC 32.5 10/29/2014 1534   RDW 13.9 10/29/2014 1534   LYMPHSABS 2.9 10/29/2014 1534   MONOABS 0.9 10/29/2014 1534   EOSABS 0.4 10/29/2014 1534   BASOSABS 0.0 10/29/2014 1534    Hgb A1C Lab Results  Component Value Date   HGBA1C 6.9* 07/11/2015      Assessment and Plan:   Medicare Annual Wellness Visit:  Diet: Endorses a poor diet. Breakfast: chocolate chip cookies, tea, toast; omlette Lunch: Skips sometimes; sandwich (tuna, cold cuts); pizza, fast food. Dinner: Skips sometimes; fruit, chocolate chip  cookies Beverages: 2 cups of hot tea every morning; G2 gatorade, diet soda. No water. Physical activity: Active around the house.  Depression/mood screen: Negative Hearing: Intact to whispered voice Visual acuity: Grossly normal, performs annual eye exam  ADLs: Capable Fall risk: None Home safety: Good Cognitive evaluation: Intact to orientation, naming, recall and repetition EOL planning: He does not have adv directives, packet provided, full code/ I agree  Preventative Medicine: Discussed the importance of healthy diet and regular exercise. Encouraged him to obtain Zostavax and influenza vaccinations soon. He will check with insurance company for coverage first. Recommendations provided to patient at discharge.  Next appointment: 6 months for follow up of chronic health problems.

## 2015-10-07 NOTE — Assessment & Plan Note (Signed)
Zostavax and influenza due. He will check regarding insurance coverage and get them at [facility]. Discussed importance of healthy diet and exercise. Labs mostly unremarkable. Exam unremarkable. He is to complete advanced directives packet and return.  I have personally reviewed and have noted: 1. The patient's medical and social history 2. Their use of alcohol, tobacco or illicit drugs 3. Their current medications and supplements 4. The patient's functional ability including ADL's, fall risks,  home safety risks and hearing or visual impairment. 5. Diet and physical activities 6. Evidence for depression or mood disorder  Follow up in 6 months for chronic healthy problems, 1 year for repeat MWV.

## 2015-10-07 NOTE — Assessment & Plan Note (Signed)
A1C improved at 6.8. Continue metformin. Encouraged healthy diet and regular exercise. Foot exam completed today, unremarkable. Urine microalbumin pending. Currently managed on ARB. Follow up in 6 months.

## 2015-10-07 NOTE — Assessment & Plan Note (Signed)
Lipid panel with LDL above goal. Initiated low dose statin due to diabetes. Will repeat in 6 months. LFT's WNL.

## 2015-10-07 NOTE — Assessment & Plan Note (Signed)
Stable today, continue current regimen. 

## 2015-10-07 NOTE — Progress Notes (Signed)
Pre visit review using our clinic review tool, if applicable. No additional management support is needed unless otherwise documented below in the visit note. 

## 2015-10-07 NOTE — Patient Instructions (Signed)
Start Simvastatin 10 mg tablets. Take 1 tablet by mouth at bedtime.  For difficulty sleeping, you may consider taking Melatonin. This may be purchased over the counter.  It is important that you improve your diet. Please limit carbohydrates in the form of white bread, rice, pasta, cakes, cookies, sugary drinks, etc. Increase your consumption of fresh fruits and vegetables.  You need to consume about 2 liters of water daily.  Start exercising. You should be getting 1 hour of moderate intensity exercise 5 days weekly.  Complete lab work prior to leaving today. I will notify you of your results.  Please schedule a follow up appointment in 6 months.   It was a pleasure to see you today!

## 2015-10-07 NOTE — Assessment & Plan Note (Signed)
Endorses long standing history of. Does not take OTC's, occasionally tylenol PM. Discussed caffeine consumption and watching TV prior to bed. Will have him try OTC melatonin and re-evaluate.

## 2015-10-07 NOTE — Assessment & Plan Note (Signed)
Currently managed on spiriva, singular, albuterol.  Denies wheezing/shortness of breath. Continue current regimen.

## 2015-11-01 DIAGNOSIS — M9903 Segmental and somatic dysfunction of lumbar region: Secondary | ICD-10-CM | POA: Diagnosis not present

## 2015-11-01 DIAGNOSIS — M546 Pain in thoracic spine: Secondary | ICD-10-CM | POA: Diagnosis not present

## 2015-11-01 DIAGNOSIS — M9902 Segmental and somatic dysfunction of thoracic region: Secondary | ICD-10-CM | POA: Diagnosis not present

## 2015-11-01 DIAGNOSIS — M545 Low back pain: Secondary | ICD-10-CM | POA: Diagnosis not present

## 2015-11-05 DIAGNOSIS — M9902 Segmental and somatic dysfunction of thoracic region: Secondary | ICD-10-CM | POA: Diagnosis not present

## 2015-11-05 DIAGNOSIS — M546 Pain in thoracic spine: Secondary | ICD-10-CM | POA: Diagnosis not present

## 2015-11-05 DIAGNOSIS — M545 Low back pain: Secondary | ICD-10-CM | POA: Diagnosis not present

## 2015-11-05 DIAGNOSIS — M9903 Segmental and somatic dysfunction of lumbar region: Secondary | ICD-10-CM | POA: Diagnosis not present

## 2015-11-17 ENCOUNTER — Other Ambulatory Visit: Payer: Self-pay | Admitting: Primary Care

## 2015-11-19 NOTE — Telephone Encounter (Signed)
Electronically refill request for   tiZANidine (ZANAFLEX) 2 MG tablet   Take 1 tablet (2 mg total) by mouth at bedtime as needed for muscle spasms.  Dispense: 30 tablet   Refills: 0    Last prescribed on 07/12/2015.        metoprolol succinate (TOPROL-XL) 50 MG 24 hr tablet   TAKE 1 TABLET BY MOUTH EVERY DAY  Dispense: 30 tablet   Refills: 0     Last prescribed on 09/04/2015.  Last seen on 10/07/2015. No future appt.

## 2015-12-23 DIAGNOSIS — E119 Type 2 diabetes mellitus without complications: Secondary | ICD-10-CM | POA: Diagnosis not present

## 2015-12-30 ENCOUNTER — Other Ambulatory Visit: Payer: Self-pay | Admitting: Primary Care

## 2015-12-30 NOTE — Telephone Encounter (Signed)
Electronically refill request for   montelukast (SINGULAIR) 10 MG tablet   TAKE 1 TABLET BY MOUTH EVERY NIGHT AT BEDTIME  Dispense: 30 tablet   Refills: 0     Last prescribed on 09/04/2015. Last seen on 10/07/2015. No future appt.

## 2016-01-03 DIAGNOSIS — Z0100 Encounter for examination of eyes and vision without abnormal findings: Secondary | ICD-10-CM | POA: Diagnosis not present

## 2016-01-21 ENCOUNTER — Other Ambulatory Visit: Payer: Self-pay | Admitting: Primary Care

## 2016-01-21 ENCOUNTER — Ambulatory Visit (INDEPENDENT_AMBULATORY_CARE_PROVIDER_SITE_OTHER): Payer: Medicare HMO | Admitting: Primary Care

## 2016-01-21 ENCOUNTER — Encounter: Payer: Self-pay | Admitting: Primary Care

## 2016-01-21 VITALS — BP 118/64 | HR 74 | Temp 98.0°F | Ht 69.0 in | Wt 190.8 lb

## 2016-01-21 DIAGNOSIS — E785 Hyperlipidemia, unspecified: Secondary | ICD-10-CM

## 2016-01-21 DIAGNOSIS — E1165 Type 2 diabetes mellitus with hyperglycemia: Secondary | ICD-10-CM

## 2016-01-21 DIAGNOSIS — J45909 Unspecified asthma, uncomplicated: Secondary | ICD-10-CM | POA: Diagnosis not present

## 2016-01-21 DIAGNOSIS — E119 Type 2 diabetes mellitus without complications: Secondary | ICD-10-CM

## 2016-01-21 DIAGNOSIS — IMO0001 Reserved for inherently not codable concepts without codable children: Secondary | ICD-10-CM

## 2016-01-21 DIAGNOSIS — M25512 Pain in left shoulder: Secondary | ICD-10-CM

## 2016-01-21 DIAGNOSIS — R062 Wheezing: Secondary | ICD-10-CM

## 2016-01-21 LAB — BASIC METABOLIC PANEL
BUN: 18 mg/dL (ref 6–23)
CO2: 28 mEq/L (ref 19–32)
Calcium: 9.7 mg/dL (ref 8.4–10.5)
Chloride: 99 mEq/L (ref 96–112)
Creatinine, Ser: 1 mg/dL (ref 0.40–1.50)
GFR: 78.5 mL/min (ref >60.00)
Glucose, Bld: 88 mg/dL (ref 70–99)
Potassium: 4.1 mEq/L (ref 3.5–5.1)
Sodium: 136 mEq/L (ref 135–145)

## 2016-01-21 LAB — LIPID PANEL
Cholesterol: 127 mg/dL (ref 0–200)
HDL: 43.9 mg/dL (ref >39.00)
LDL Cholesterol: 44 mg/dL (ref 0–99)
NonHDL: 82.86
Total CHOL/HDL Ratio: 3
Triglycerides: 196 mg/dL — ABNORMAL HIGH (ref 0.0–149.0)
VLDL: 39.2 mg/dL (ref 0.0–40.0)

## 2016-01-21 LAB — HEMOGLOBIN A1C: Hgb A1c MFr Bld: 7.2 % — ABNORMAL HIGH (ref 4.6–6.5)

## 2016-01-21 MED ORDER — LEVALBUTEROL HCL 1.25 MG/3ML IN NEBU: 1.2500 mg | INHALATION_SOLUTION | Freq: Four times a day (QID) | RESPIRATORY_TRACT | Status: DC | PRN

## 2016-01-21 NOTE — Assessment & Plan Note (Signed)
Since 2001 s/p fall from 8 feet. Over past several months worse. Exam with decrease in ROM with any abduction move. Will have him see Dr. Patsy Lageropland for further evaluation.

## 2016-01-21 NOTE — Assessment & Plan Note (Addendum)
A1C of 6.8 in November 2016, recently with symptoms of dizziness, palpitations, with sugars spiking in 160's-190's. Currently managed on Metformin 1000 mg BID. A1C today of 7.2. Diet has overall improved from last visit, could be increased insulin resistance. Will consider adding low dose glipizide with precautions for hypoglycemia.

## 2016-01-21 NOTE — Assessment & Plan Note (Signed)
SOB with wheezing since weather changes. Incorrectly using inhalers. Long discussion today regarding correct inhaler use.  Refills of Xopenex neb treatments provided and will use only PRN.  Exam unremarkable.

## 2016-01-21 NOTE — Patient Instructions (Addendum)
Take the Advair and Spiriva inhalers everyday as prescribed.  Use the albuterol inhaler or Xopenex treatments as needed for shortness of breath.  Complete lab work prior to leaving today. I will notify you of your results once received.   Schedule an appointment with Dr. Patsy Lageropland for evaluation of your left shoulder.   It was a pleasure to see you today!

## 2016-01-21 NOTE — Progress Notes (Signed)
Subjective:    Patient ID: Brian Mullen, male    DOB: 06-30-1946, 70 y.o.   MRN: [id number]  HPI  [name] is a 70 year old male who presents today with multiple complaints.  1) Type 2 Diabetes: Last A1C of 6.8 in November 2016. He's currently managed on Metformin 1000 mg BID. He was encouraged to work on improving his poor diet last visit as his A1C met guidelines for control.   Over the past weekend he's noticed spiking of his blood sugars. He noticed feeling "strange" this past weekend with symptoms including palpitations, dizziness, shortness of breath. During each episode of feeling "strange" he checked his sugars which were running 160-190's without eating. He's started taking an extra Metformin tablet when he experiences these symptoms and noticed improvement after taking.  He endorses a fair diet which includes: Breakfast: Skips Lunch: Sandwich, restaurants (chicken, mac and cheese, ribs, steak) Dinner: Meat, vegetables, starches (potatoes, rice) Snacks: Cookies (1-2 daily), cheese, chips Desserts: Cookies as mentioned above Beverages: Diet soda, G2 gatorade, Plexus, limited water consumption\  He does not exercise but is not sedentary.   2) Shortness of Breath: History of asthma. He's not using his inhalers as prescribed. He's only using the Advair, Spiriva, and albuterol as needed for shortness of breath. This morning he had to use some of his Xopenex nebulized treatments as SOB did not improve with the Advair and Spiriva.   3) Left shoulder pain: Chronic. Worse for the past several months. He's been seeing a chirporactor for numerous years and hasn't noticed improvement to his shoulder. He fell in 2001, 8 feet off of a ladder, landing onto a gym floor, onto left shoulder, while holding a paint bucket. His pain is worse with any abduction motion and is affecting his ADL's. Denies radiculopathy. The majority of his pain is to the left shoulder joint. No improvement with OTC  medications.  Review of Systems  Constitutional: Negative for fever and fatigue.  Respiratory: Positive for shortness of breath and wheezing. Negative for cough.   Cardiovascular: Positive for palpitations. Negative for chest pain.  Musculoskeletal: Positive for arthralgias.  Neurological: Positive for dizziness. Negative for numbness.       Past Medical History  Diagnosis Date  . Asthma   . GERD (gastroesophageal reflux disease)   . Hypertension   . Allergy   . Diabetes mellitus without complication [facility])     Social History   Social History  . Marital Status: Married    Spouse Name: N/A  . Number of Children: N/A  . Years of Education: N/A   Occupational History  . Not on file.   Social History Main Topics  . Smoking status: Never Smoker   . Smokeless tobacco: Not on file  . Alcohol Use: 0.0 oz/week    0 Standard drinks or equivalent per week     Comment: occ  . Drug Use: No  . Sexual Activity: Not on file   Other Topics Concern  . Not on file   Social History Narrative   Married.   No children   Retried. Worked as a Furniture conservator/restorer.   Enjoys working on [employer], yard work, gardening.       Past Surgical History  Procedure Laterality Date  . Tonsillectomy    . Elbow surgery    . Knee surgery Right     Family History  Problem Relation Age of Onset  . Pulmonary embolism Father 96    Deceased  .  Heart attack Mother 68    Deceased  . Diabetes Maternal Grandmother   . Heart attack Sister     Allergies  Allergen Reactions  . Coconut Oil Anaphylaxis  . Iodine Anaphylaxis  . Shellfish Allergy Anaphylaxis    Current Outpatient Prescriptions on File Prior to Visit  Medication Sig Dispense Refill  . albuterol (PROVENTIL HFA;VENTOLIN HFA) 108 (90 BASE) MCG/ACT inhaler Inhale 2 puffs into the lungs every 6 (six) hours as needed for wheezing or shortness of breath. 1 Inhaler 3  . aspirin 81 MG tablet Take 81 mg by mouth daily.    .  diclofenac (VOLTAREN) 75 MG EC tablet Take 1 tablet (75 mg total) by mouth 2 (two) times daily. 60 tablet 3  . Fluticasone-Salmeterol (ADVAIR) 500-50 MCG/DOSE AEPB Inhale 1 puff into the lungs every 12 (twelve) hours. 60 each 3  . Glucose Blood (BLOOD GLUCOSE TEST STRIPS) STRP USE TO CHECK BLOOD SUGAR TWICE DAILY 100 each 2  . losartan (COZAAR) 25 MG tablet Take 1 tablet (25 mg total) by mouth daily. 30 tablet 5  . metFORMIN (GLUCOPHAGE) 500 MG tablet TAKE 2 TABLETS BY MOUTH TWICE DAILY WITH A MEAL 120 tablet 0  . metoprolol succinate (TOPROL-XL) 50 MG 24 hr tablet TAKE 1 TABLET BY MOUTH EVERY DAY 90 tablet 1  . montelukast (SINGULAIR) 10 MG tablet TAKE 1 TABLET BY MOUTH EVERY NIGHT AT BEDTIME 30 tablet 5  . simvastatin (ZOCOR) 10 MG tablet Take 1 tablet (10 mg total) by mouth at bedtime. 90 tablet 3  . tiotropium (SPIRIVA) 18 MCG inhalation capsule Place 1 capsule (18 mcg total) into inhaler and inhale daily. 30 capsule 3  . tiZANidine (ZANAFLEX) 2 MG tablet TAKE 1 TABLET(2 MG) BY MOUTH AT BEDTIME AS NEEDED FOR MUSCLE SPASMS 30 tablet 0  . [employer] LANCETS MISC Use to check blood sugar twice daily. 100 each 2   No current facility-administered medications on file prior to visit.    BP 118/64 mmHg  Pulse 74  Temp(Src) 98 F (36.7 C) (Oral)  Ht 5' 9"  (1.753 m)  Wt 190 lb 12.8 oz (86.546 kg)  BMI 28.16 kg/m2  SpO2 98%    Objective:   Physical Exam  Constitutional: He appears well-nourished.  Cardiovascular: Normal rate and regular rhythm.   Pulmonary/Chest: Effort normal and breath sounds normal. He has no wheezes. He has no rales.  Musculoskeletal:       Left shoulder: He exhibits decreased range of motion and pain. He exhibits no tenderness, no swelling and no deformity.  Skin: Skin is warm and dry.          Assessment & Plan:

## 2016-01-21 NOTE — Progress Notes (Signed)
Pre visit review using our clinic review tool, if applicable. No additional management support is needed unless otherwise documented below in the visit note. 

## 2016-01-22 DIAGNOSIS — R69 Illness, unspecified: Secondary | ICD-10-CM | POA: Diagnosis not present

## 2016-01-23 ENCOUNTER — Other Ambulatory Visit: Payer: Self-pay | Admitting: Family

## 2016-01-23 ENCOUNTER — Ambulatory Visit: Payer: Medicare HMO | Admitting: Primary Care

## 2016-01-23 DIAGNOSIS — R69 Illness, unspecified: Secondary | ICD-10-CM | POA: Diagnosis not present

## 2016-01-27 ENCOUNTER — Ambulatory Visit (INDEPENDENT_AMBULATORY_CARE_PROVIDER_SITE_OTHER): Payer: Medicare HMO | Admitting: Family Medicine

## 2016-01-27 ENCOUNTER — Telehealth: Payer: Self-pay

## 2016-01-27 ENCOUNTER — Encounter: Payer: Self-pay | Admitting: Family Medicine

## 2016-01-27 VITALS — BP 116/60 | HR 63 | Temp 97.7°F | Ht 69.0 in | Wt 194.5 lb

## 2016-01-27 DIAGNOSIS — M25512 Pain in left shoulder: Secondary | ICD-10-CM

## 2016-01-27 DIAGNOSIS — M7502 Adhesive capsulitis of left shoulder: Secondary | ICD-10-CM | POA: Diagnosis not present

## 2016-01-27 DIAGNOSIS — E119 Type 2 diabetes mellitus without complications: Secondary | ICD-10-CM

## 2016-01-27 MED ORDER — METHYLPREDNISOLONE ACETATE 40 MG/ML IJ SUSP
80.0000 mg | Freq: Once | INTRAMUSCULAR | Status: AC
  Administered 2016-01-27: 80 mg via INTRA_ARTICULAR

## 2016-01-27 NOTE — Telephone Encounter (Signed)
Pt was seen earlier today; pt went to car and cannot grasp steering wheel with lt hand; pt said has numbness in lower lt arm and does not feel like has control of arm; pt said cannot raise arm. Lupita LeashDonna CMA advised to take pt back to room was seen in and Dr Patsy Lageropland will be in.

## 2016-01-27 NOTE — Progress Notes (Signed)
Dr. Karleen Hampshire T. Medard Decuir, MD, CAQ Sports Medicine Primary Care and Sports Medicine [address] [place], [address] Phone: [phone] Fax: [phone]  01/27/2016  Patient: Brian Mullen, MRN: [id number], DOB: Oct 01, 1946, 70 y.o.  Primary Physician:  Morrie Sheldon, NP   Chief Complaint  Patient presents with  . Shoulder Pain    Left   Subjective:   Brian Mullen is a 70 y.o. very pleasant male patient who presents with the following: shoulder pain  The patient noted above presents with shoulder pain that has been ongoing for 4-5 mo there is no history of trauma or accident. (involving L shoulder) The patient denies neck pain or radicular symptoms. No shoulder blade pain Denies dislocation, subluxation, separation of the shoulder. The patient does complain of pain with flexion, abduction, and terminal motion.  Significant restriction of motion. he describes a deep ache around the shoulder, and sometimes it will wake the patient up at night.  L shoulder, hurts to reach and doing simple things. WOrse in the last 4-5 months. Fell in 2001.  More recently walking with a cane, some will bother him. With distance.   Medications Tried: OTC nsaids Ice or Heat: minimal help Tried PT: No  Prior shoulder Injury: No Prior surgery: No Prior fracture: No  Past Medical History, Surgical History, Social History, Family History, Medications, and allergies reviewed and updated if relevant.   Patient Active Problem List   Diagnosis Date Noted  . Shoulder pain 01/21/2016  . Medicare annual wellness visit, subsequent 10/07/2015  . Insomnia 10/07/2015  . Asthma 03/14/2015  . Type 2 diabetes mellitus, uncontrolled (HCC) 10/29/2014  . Pure hypercholesterolemia 10/29/2014  . Lumbar radiculopathy, chronic 09/22/2013  . Essential hypertension 08/18/2013  . Left hip pain 08/18/2013    Past Medical History  Diagnosis Date  . Asthma   . GERD (gastroesophageal reflux disease)     . Hypertension   . Allergy   . Diabetes mellitus without complication [facility])     Past Surgical History  Procedure Laterality Date  . Tonsillectomy    . Elbow surgery    . Knee surgery Right     Social History   Social History  . Marital Status: Married    Spouse Name: N/A  . Number of Children: N/A  . Years of Education: N/A   Occupational History  . Not on file.   Social History Main Topics  . Smoking status: Former Games developer  . Smokeless tobacco: Never Used  . Alcohol Use: 0.0 oz/week    0 Standard drinks or equivalent per week     Comment: occ  . Drug Use: No  . Sexual Activity: Not on file   Other Topics Concern  . Not on file   Social History Narrative   Married.   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   Enjoys working on [employer], yard work, gardening.       Family History  Problem Relation Age of Onset  . Pulmonary embolism Father 40    Deceased  . Heart attack Mother 44    Deceased  . Diabetes Maternal Grandmother   . Heart attack Sister     Allergies  Allergen Reactions  . Coconut Oil Anaphylaxis  . Iodine Anaphylaxis  . Shellfish Allergy Anaphylaxis    Medication list reviewed and updated in full in [place] [facility].  GEN: No fevers, chills. Nontoxic. Primarily MSK c/o today. MSK: Detailed in the HPI GI: tolerating  PO intake without difficulty Neuro: No numbness, parasthesias, or tingling associated. Otherwise the pertinent positives of the ROS are noted above.    Objective:   Blood pressure 116/60, pulse 63, temperature 97.7 F (36.5 C), temperature source Oral, height 5\' 9"  (1.753 m), weight 194 lb 8 oz (88.225 kg).  GEN: Well-developed,well-nourished,in no acute distress; alert,appropriate and cooperative throughout examination HEENT: Normocephalic and atraumatic without obvious abnormalities. Ears, externally no deformities PULM: Breathing comfortably in no respiratory distress EXT: No clubbing, cyanosis,  or edema PSYCH: Normally interactive. Cooperative during the interview. Pleasant. Friendly and conversant. Not anxious or depressed appearing. Normal, full affect.  Shoulder: L Inspection: No muscle wasting or winging Ecchymosis/edema: neg  AC joint, scapula, clavicle: NT Cervical spine: NT, full ROM Spurling's: neg Abduction: 5/5, 110 deg Flexion: 5/5, 105 deg IR, full, lift-off: 5/5, none ER at neutral: 5/5, 10-15 deg AC crossover and compression: unable to complete Additional special testing is equivocal given lack of motion C5-T1 intact Sensation intact Grip 5/5  Assessment and Plan:   Adhesive capsulitis of left shoulder  Left shoulder pain - Plan: methylPREDNISolone acetate (DEPO-MEDROL) injection 80 mg  Type 2 diabetes mellitus without complication, without long-term current use of insulin (HCC)  >25 minutes spent in face to face time with patient, >50% spent in counselling or coordination of care  Patient was given a systematic ROM protocol from [facility] to be done daily. Emphasized importance of adherence, help of PT, daily HEP.  The average length of total symptoms is 12-18 months going through 3 different phases in the freezing and thawing process. Reviewed all with patient.   Tylenol or NSAID of choice prn for pain relief Intraarticular shoulder injections discussed with patient, which have good evidence for accelerating the thawing phase.  Patient will be sent for formal PT for aggressive frozen shoulder ROM - for now he wants to hold and try on his own. Will need RTC str and scapular stabilization to fix underlying mechanics.  Intrarticular Shoulder Injection, LEFT Verbal consent was obtained from the patient. Risks including infection explained and contrasted with benefits and alternatives. Patient prepped with Chloraprep and Ethyl Chloride used for anesthesia. An intraarticular shoulder injection was performed using the posterior approach. The patient tolerated  the procedure well and had decreased pain post injection.   Later, the patient returned to the room and he had some topical anesthesia on the region of the external rotators and decreased str with external rotation. IROM, abd intact 5/5. Biceps flexion and ext 5/5 and grip 5/5. Sensation intact throughout entire arm. Observed for 40 minutes and at that time, sensation and movement patterns had improved and approaching normal.  Likely unexpected anesthesia from lidocaine, and we discussed all at length. No lasting probs should occur.  Injection: 8 cc of Lidocaine 1% and 2 mL Depo-Medrol 40 mg. Needle: 22 gauge   Follow-up: Return in about 2 months (around 03/28/2016).  Signed,  Elpidio GaleaSpencer T. Tanaiya Kolarik, MD   Patient's Medications  New Prescriptions   No medications on file  Previous Medications   ALBUTEROL (PROVENTIL HFA;VENTOLIN HFA) 108 (90 BASE) MCG/ACT INHALER    Inhale 2 puffs into the lungs every 6 (six) hours as needed for wheezing or shortness of breath.   ASPIRIN 81 MG TABLET    Take 81 mg by mouth daily.   DICLOFENAC (VOLTAREN) 75 MG EC TABLET    Take 1 tablet (75 mg total) by mouth 2 (two) times daily.   FEXOFENADINE (ALLEGRA) 180 MG TABLET  Take 180 mg by mouth daily.   FLUTICASONE-SALMETEROL (ADVAIR) 500-50 MCG/DOSE AEPB    Inhale 1 puff into the lungs every 12 (twelve) hours.   GLUCOSE BLOOD (BLOOD GLUCOSE TEST STRIPS) STRP    USE TO CHECK BLOOD SUGAR TWICE DAILY   LEVALBUTEROL (XOPENEX) 1.25 MG/3ML NEBULIZER SOLUTION    INHALE 1 VIAL(1.25MG ) VIA NEBULIZER EVERY 6 HOURS AS NEEDED FOR WHEEZING   LOSARTAN (COZAAR) 25 MG TABLET    Take 1 tablet (25 mg total) by mouth daily.   METFORMIN (GLUCOPHAGE) 500 MG TABLET    TAKE 2 TABLETS BY MOUTH TWICE DAILY WITH A MEAL   METOPROLOL SUCCINATE (TOPROL-XL) 50 MG 24 HR TABLET    TAKE 1 TABLET BY MOUTH EVERY DAY   MONTELUKAST (SINGULAIR) 10 MG TABLET    TAKE 1 TABLET BY MOUTH EVERY NIGHT AT BEDTIME   SIMVASTATIN (ZOCOR) 10 MG TABLET    Take 1  tablet (10 mg total) by mouth at bedtime.   TIOTROPIUM (SPIRIVA) 18 MCG INHALATION CAPSULE    Place 1 capsule (18 mcg total) into inhaler and inhale daily.   TIZANIDINE (ZANAFLEX) 2 MG TABLET    TAKE 1 TABLET(2 MG) BY MOUTH AT BEDTIME AS NEEDED FOR MUSCLE SPASMS   [employer] LANCETS MISC    Use to check blood sugar twice daily.  Modified Medications   No medications on file  Discontinued Medications   No medications on file

## 2016-01-27 NOTE — Progress Notes (Signed)
Pre visit review using our clinic review tool, if applicable. No additional management support is needed unless otherwise documented below in the visit note. 

## 2016-01-27 NOTE — Telephone Encounter (Signed)
Please see my office note. Anesthesia secondary to lidocaine in region of external rotators. Observed in the office over 40 minutes and improving at time of discharge from office with recovery.  Electronically Signed  By: Hannah BeatSpencer Yu Cragun, MD On: 01/27/2016 1:41 PM

## 2016-01-28 ENCOUNTER — Other Ambulatory Visit: Payer: Self-pay

## 2016-01-28 MED ORDER — FLUTICASONE-SALMETEROL 500-50 MCG/DOSE IN AEPB: 1.0000 | INHALATION_SPRAY | Freq: Two times a day (BID) | RESPIRATORY_TRACT | Status: DC

## 2016-01-28 NOTE — Telephone Encounter (Signed)
Pt request refill of advair to [facility] [place]. Pt seen 01/21/16 and advised to use advair. Advised pt refill done per protocol. Pt wants to know how to get a new diabetic meter with testing supplies;pt to contact his ins co to see what diabetic supplies are approved and pt is to cb with that info. Pt voiced understanding.

## 2016-02-06 ENCOUNTER — Other Ambulatory Visit: Payer: Self-pay | Admitting: Primary Care

## 2016-02-06 ENCOUNTER — Telehealth: Payer: Self-pay

## 2016-02-06 DIAGNOSIS — J45909 Unspecified asthma, uncomplicated: Secondary | ICD-10-CM

## 2016-02-06 DIAGNOSIS — R69 Illness, unspecified: Secondary | ICD-10-CM | POA: Diagnosis not present

## 2016-02-06 MED ORDER — ALBUTEROL SULFATE (2.5 MG/3ML) 0.083% IN NEBU: 2.5000 mg | INHALATION_SOLUTION | Freq: Four times a day (QID) | RESPIRATORY_TRACT | Status: DC | PRN

## 2016-02-06 NOTE — Telephone Encounter (Signed)
Received a fax from [facility] [facility] stating they work along side of [employer] to try and find cheaper alternatives to patient meds. They are asking if it would be appropriate to change his Xopenex Nebulizer solution to albuterol solution or Ipratropium/Albuterol solution. It says if you want to make the change, it can be sent to his local pharmacy. It does not say how much he would save. Form on L-3 CommunicationsKate's desk.

## 2016-02-06 NOTE — Telephone Encounter (Signed)
Please notify Brian Mullen that we received a request from his insurance company to change his nebulized treatments from Xopenex to albuterol. I have made these changes in order to save him some money. These are essentially the same ingredients and will work the same. He is to use this medication only as needed, not every day.  If he has a problem with this, then please notify me, otherwise just an FYI for him.

## 2016-02-06 NOTE — Telephone Encounter (Signed)
See pharmacy note, you just prescribed so a 3 month supply would be but pharmacy is requesting , is it okay to fill large quantity of med, please advise

## 2016-02-07 NOTE — Telephone Encounter (Signed)
Patient returned phone call.  Please call back at [phone].

## 2016-02-07 NOTE — Telephone Encounter (Signed)
Left message on patient's voicemail to return call

## 2016-02-07 NOTE — Telephone Encounter (Signed)
Patient advised.

## 2016-02-19 ENCOUNTER — Other Ambulatory Visit: Payer: Self-pay | Admitting: Primary Care

## 2016-02-21 DIAGNOSIS — M9902 Segmental and somatic dysfunction of thoracic region: Secondary | ICD-10-CM | POA: Diagnosis not present

## 2016-02-21 DIAGNOSIS — M9903 Segmental and somatic dysfunction of lumbar region: Secondary | ICD-10-CM | POA: Diagnosis not present

## 2016-02-21 DIAGNOSIS — M545 Low back pain: Secondary | ICD-10-CM | POA: Diagnosis not present

## 2016-02-21 DIAGNOSIS — M546 Pain in thoracic spine: Secondary | ICD-10-CM | POA: Diagnosis not present

## 2016-03-03 DIAGNOSIS — M9902 Segmental and somatic dysfunction of thoracic region: Secondary | ICD-10-CM | POA: Diagnosis not present

## 2016-03-03 DIAGNOSIS — M9903 Segmental and somatic dysfunction of lumbar region: Secondary | ICD-10-CM | POA: Diagnosis not present

## 2016-03-03 DIAGNOSIS — M545 Low back pain: Secondary | ICD-10-CM | POA: Diagnosis not present

## 2016-03-03 DIAGNOSIS — M546 Pain in thoracic spine: Secondary | ICD-10-CM | POA: Diagnosis not present

## 2016-03-06 DIAGNOSIS — M9903 Segmental and somatic dysfunction of lumbar region: Secondary | ICD-10-CM | POA: Diagnosis not present

## 2016-03-06 DIAGNOSIS — M9902 Segmental and somatic dysfunction of thoracic region: Secondary | ICD-10-CM | POA: Diagnosis not present

## 2016-03-06 DIAGNOSIS — M546 Pain in thoracic spine: Secondary | ICD-10-CM | POA: Diagnosis not present

## 2016-03-06 DIAGNOSIS — M545 Low back pain: Secondary | ICD-10-CM | POA: Diagnosis not present

## 2016-03-10 DIAGNOSIS — M9903 Segmental and somatic dysfunction of lumbar region: Secondary | ICD-10-CM | POA: Diagnosis not present

## 2016-03-10 DIAGNOSIS — M546 Pain in thoracic spine: Secondary | ICD-10-CM | POA: Diagnosis not present

## 2016-03-10 DIAGNOSIS — M545 Low back pain: Secondary | ICD-10-CM | POA: Diagnosis not present

## 2016-03-10 DIAGNOSIS — M9902 Segmental and somatic dysfunction of thoracic region: Secondary | ICD-10-CM | POA: Diagnosis not present

## 2016-03-12 DIAGNOSIS — M9903 Segmental and somatic dysfunction of lumbar region: Secondary | ICD-10-CM | POA: Diagnosis not present

## 2016-03-12 DIAGNOSIS — M545 Low back pain: Secondary | ICD-10-CM | POA: Diagnosis not present

## 2016-03-12 DIAGNOSIS — M546 Pain in thoracic spine: Secondary | ICD-10-CM | POA: Diagnosis not present

## 2016-03-12 DIAGNOSIS — M9902 Segmental and somatic dysfunction of thoracic region: Secondary | ICD-10-CM | POA: Diagnosis not present

## 2016-03-18 DIAGNOSIS — M545 Low back pain: Secondary | ICD-10-CM | POA: Diagnosis not present

## 2016-03-18 DIAGNOSIS — M9902 Segmental and somatic dysfunction of thoracic region: Secondary | ICD-10-CM | POA: Diagnosis not present

## 2016-03-18 DIAGNOSIS — M546 Pain in thoracic spine: Secondary | ICD-10-CM | POA: Diagnosis not present

## 2016-03-18 DIAGNOSIS — M9903 Segmental and somatic dysfunction of lumbar region: Secondary | ICD-10-CM | POA: Diagnosis not present

## 2016-03-30 ENCOUNTER — Encounter: Payer: Self-pay | Admitting: Family Medicine

## 2016-03-30 ENCOUNTER — Ambulatory Visit (INDEPENDENT_AMBULATORY_CARE_PROVIDER_SITE_OTHER): Payer: Medicare HMO | Admitting: Family Medicine

## 2016-03-30 VITALS — BP 98/54 | HR 71 | Temp 97.6°F | Ht 69.0 in | Wt 192.8 lb

## 2016-03-30 DIAGNOSIS — M7502 Adhesive capsulitis of left shoulder: Secondary | ICD-10-CM | POA: Diagnosis not present

## 2016-03-30 NOTE — Progress Notes (Signed)
Dr. Karleen HampshireSpencer T. Shaela Boer, MD, CAQ Sports Medicine Primary Care and Sports Medicine [address] [place], [id number] Phone: [phone] Fax: [phone]  03/30/2016  Patient: Brian Mullen, MRN: [id number], DOB: 10/03/1946, 70 y.o.  Primary Physician:  Morrie Sheldonlark,Katherine Kendal, NP   Chief Complaint  Patient presents with  . Follow-up    Left Shoulder   Subjective:   Brian Mullen is a 70 y.o. very pleasant male patient who presents with the following: shoulder pain  F/u frozen shoulder:   Shoulder is doing better. For a while after the shot, felt no discomfort. As time with on, and the less active with the weather. ROM is dramatically better.   He has been working on his ROM at home. S/p injection at last OV  01/27/2016 Last OV with Hannah BeatSpencer Keirstan Iannello, MD  The patient noted above presents with shoulder pain that has been ongoing for 4-5 mo there is no history of trauma or accident. (involving L shoulder) The patient denies neck pain or radicular symptoms. No shoulder blade pain Denies dislocation, subluxation, separation of the shoulder. The patient does complain of pain with flexion, abduction, and terminal motion.  Significant restriction of motion. he describes a deep ache around the shoulder, and sometimes it will wake the patient up at night.  L shoulder, hurts to reach and doing simple things. WOrse in the last 4-5 months. Fell in 2001.  More recently walking with a cane, some will bother him. With distance.   Medications Tried: OTC nsaids Ice or Heat: minimal help Tried PT: No  Prior shoulder Injury: No Prior surgery: No Prior fracture: No  Past Medical History, Surgical History, Social History, Family History, Medications, and allergies reviewed and updated if relevant.   Patient Active Problem List   Diagnosis Date Noted  . Shoulder pain 01/21/2016  . Medicare annual wellness visit, subsequent 10/07/2015  . Insomnia 10/07/2015  . Asthma 03/14/2015  . Type  2 diabetes mellitus, uncontrolled (HCC) 10/29/2014  . Pure hypercholesterolemia 10/29/2014  . Lumbar radiculopathy, chronic 09/22/2013  . Essential hypertension 08/18/2013  . Left hip pain 08/18/2013    Past Medical History  Diagnosis Date  . Asthma   . GERD (gastroesophageal reflux disease)   . Hypertension   . Allergy   . Diabetes mellitus without complication [facility](HCC)     Past Surgical History  Procedure Laterality Date  . Tonsillectomy    . Elbow surgery    . Knee surgery Right     Social History   Social History  . Marital Status: Married    Spouse Name: N/A  . Number of Children: N/A  . Years of Education: N/A   Occupational History  . Not on file.   Social History Main Topics  . Smoking status: Former Games developermoker  . Smokeless tobacco: Never Used  . Alcohol Use: 0.0 oz/week    0 Standard drinks or equivalent per week     Comment: occ  . Drug Use: No  . Sexual Activity: Not on file   Other Topics Concern  . Not on file   Social History Narrative   Married.   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   Enjoys working on [employer], yard work, gardening.       Family History  Problem Relation Age of Onset  . Pulmonary embolism Father 244    Deceased  . Heart attack Mother 3762    Deceased  . Diabetes Maternal Grandmother   .  Heart attack Sister     Allergies  Allergen Reactions  . Coconut Oil Anaphylaxis  . Iodine Anaphylaxis  . Shellfish Allergy Anaphylaxis    Medication list reviewed and updated in full in [place] [facility].  GEN: No fevers, chills. Nontoxic. Primarily MSK c/o today. MSK: Detailed in the HPI GI: tolerating PO intake without difficulty Neuro: No numbness, parasthesias, or tingling associated. Otherwise the pertinent positives of the ROS are noted above.    Objective:   Blood pressure 98/54, pulse 71, temperature 97.6 F (36.4 C), temperature source Oral, height  (1.753 m), weight 192 lb 12 oz (87.431  kg).  GEN: Well-developed,well-nourished,in no acute distress; alert,appropriate and cooperative throughout examination HEENT: Normocephalic and atraumatic without obvious abnormalities. Ears, externally no deformities PULM: Breathing comfortably in no respiratory distress EXT: No clubbing, cyanosis, or edema PSYCH: Normally interactive. Cooperative during the interview. Pleasant. Friendly and conversant. Not anxious or depressed appearing. Normal, full affect.  Shoulder: L Inspection: No muscle wasting or winging Ecchymosis/edema: neg  AC joint, scapula, clavicle: NT Cervical spine: NT, full ROM Spurling's: neg Abduction: 5/5, 170 deg Flexion: 5/5, 165 deg IR, full, lift-off: 5/5, none ER at neutral: 5/5, 70 deg AC crossover and compression: neg Additional special testing is equivocal given lack of motion C5-T1 intact Sensation intact Grip 5/5  Assessment and Plan:   Adhesive capsulitis of left shoulder  Cont home program  When he gets back in town, he will call me to help set up some PT Great progress.  Follow-up: prn  Signed,  Kodee Ravert T. Elanna Bert, MD   Patient's Medications  New Prescriptions   No medications on file  Previous Medications   ALBUTEROL (PROVENTIL HFA;VENTOLIN HFA) 108 (90 BASE) MCG/ACT INHALER    Inhale 2 puffs into the lungs every 6 (six) hours as needed for wheezing or shortness of breath.   ALBUTEROL (PROVENTIL) (2.5 MG/3ML) 0.083% NEBULIZER SOLUTION    USE 3 ML BY NEBULIZATION EVERY 6 HOURS AS NEEDED FOR WHEEZING OR SHORTNESS OF BREATH   ASPIRIN 81 MG TABLET    Take 81 mg by mouth daily.   DICLOFENAC (VOLTAREN) 75 MG EC TABLET    Take 1 tablet (75 mg total) by mouth 2 (two) times daily.   FEXOFENADINE (ALLEGRA) 180 MG TABLET    Take 180 mg by mouth daily.   FLUTICASONE-SALMETEROL (ADVAIR) 500-50 MCG/DOSE AEPB    Inhale 1 puff into the lungs every 12 (twelve) hours.   GLUCOSE BLOOD (BLOOD GLUCOSE TEST STRIPS) STRP    USE TO CHECK BLOOD SUGAR TWICE  DAILY   LOSARTAN (COZAAR) 25 MG TABLET    TAKE 1 TABLET(25 MG) BY MOUTH DAILY   METFORMIN (GLUCOPHAGE) 500 MG TABLET    TAKE 2 TABLETS BY MOUTH TWICE DAILY WITH A MEAL   METOPROLOL SUCCINATE (TOPROL-XL) 50 MG 24 HR TABLET    TAKE 1 TABLET BY MOUTH EVERY DAY   MONTELUKAST (SINGULAIR) 10 MG TABLET    TAKE 1 TABLET BY MOUTH EVERY NIGHT AT BEDTIME   SIMVASTATIN (ZOCOR) 10 MG TABLET    Take 1 tablet (10 mg total) by mouth at bedtime.   TIOTROPIUM (SPIRIVA) 18 MCG INHALATION CAPSULE    Place 1 capsule (18 mcg total) into inhaler and inhale daily.   TIZANIDINE (ZANAFLEX) 2 MG TABLET    TAKE 1 TABLET(2 MG) BY MOUTH AT BEDTIME AS NEEDED FOR MUSCLE SPASMS   [employer] LANCETS MISC    Use to check blood sugar twice daily.  Modified Medications  No medications on file  Discontinued Medications   No medications on file

## 2016-03-30 NOTE — Progress Notes (Signed)
Pre visit review using our clinic review tool, if applicable. No additional management support is needed unless otherwise documented below in the visit note. 

## 2016-04-01 DIAGNOSIS — M546 Pain in thoracic spine: Secondary | ICD-10-CM | POA: Diagnosis not present

## 2016-04-01 DIAGNOSIS — M9903 Segmental and somatic dysfunction of lumbar region: Secondary | ICD-10-CM | POA: Diagnosis not present

## 2016-04-01 DIAGNOSIS — M9902 Segmental and somatic dysfunction of thoracic region: Secondary | ICD-10-CM | POA: Diagnosis not present

## 2016-04-01 DIAGNOSIS — M545 Low back pain: Secondary | ICD-10-CM | POA: Diagnosis not present

## 2016-04-05 DIAGNOSIS — R4702 Dysphasia: Secondary | ICD-10-CM | POA: Diagnosis not present

## 2016-04-07 DIAGNOSIS — M546 Pain in thoracic spine: Secondary | ICD-10-CM | POA: Diagnosis not present

## 2016-04-07 DIAGNOSIS — M545 Low back pain: Secondary | ICD-10-CM | POA: Diagnosis not present

## 2016-04-07 DIAGNOSIS — M9902 Segmental and somatic dysfunction of thoracic region: Secondary | ICD-10-CM | POA: Diagnosis not present

## 2016-04-07 DIAGNOSIS — M9903 Segmental and somatic dysfunction of lumbar region: Secondary | ICD-10-CM | POA: Diagnosis not present

## 2016-04-21 DIAGNOSIS — K219 Gastro-esophageal reflux disease without esophagitis: Secondary | ICD-10-CM | POA: Diagnosis not present

## 2016-04-21 DIAGNOSIS — J4530 Mild persistent asthma, uncomplicated: Secondary | ICD-10-CM | POA: Diagnosis not present

## 2016-05-08 DIAGNOSIS — R05 Cough: Secondary | ICD-10-CM | POA: Diagnosis not present

## 2016-05-08 DIAGNOSIS — R0981 Nasal congestion: Secondary | ICD-10-CM | POA: Diagnosis not present

## 2016-05-17 ENCOUNTER — Other Ambulatory Visit: Payer: Self-pay | Admitting: Primary Care

## 2016-06-03 DIAGNOSIS — M542 Cervicalgia: Secondary | ICD-10-CM | POA: Diagnosis not present

## 2016-06-03 DIAGNOSIS — M9903 Segmental and somatic dysfunction of lumbar region: Secondary | ICD-10-CM | POA: Diagnosis not present

## 2016-06-03 DIAGNOSIS — M545 Low back pain: Secondary | ICD-10-CM | POA: Diagnosis not present

## 2016-06-03 DIAGNOSIS — M9901 Segmental and somatic dysfunction of cervical region: Secondary | ICD-10-CM | POA: Diagnosis not present

## 2016-06-03 DIAGNOSIS — M9902 Segmental and somatic dysfunction of thoracic region: Secondary | ICD-10-CM | POA: Diagnosis not present

## 2016-06-03 DIAGNOSIS — M9906 Segmental and somatic dysfunction of lower extremity: Secondary | ICD-10-CM | POA: Diagnosis not present

## 2016-06-03 DIAGNOSIS — M546 Pain in thoracic spine: Secondary | ICD-10-CM | POA: Diagnosis not present

## 2016-06-06 ENCOUNTER — Other Ambulatory Visit: Payer: Self-pay | Admitting: Primary Care

## 2016-06-08 DIAGNOSIS — M9903 Segmental and somatic dysfunction of lumbar region: Secondary | ICD-10-CM | POA: Diagnosis not present

## 2016-06-08 DIAGNOSIS — M542 Cervicalgia: Secondary | ICD-10-CM | POA: Diagnosis not present

## 2016-06-08 DIAGNOSIS — M9901 Segmental and somatic dysfunction of cervical region: Secondary | ICD-10-CM | POA: Diagnosis not present

## 2016-06-08 DIAGNOSIS — M546 Pain in thoracic spine: Secondary | ICD-10-CM | POA: Diagnosis not present

## 2016-06-08 DIAGNOSIS — M545 Low back pain: Secondary | ICD-10-CM | POA: Diagnosis not present

## 2016-06-08 DIAGNOSIS — M9902 Segmental and somatic dysfunction of thoracic region: Secondary | ICD-10-CM | POA: Diagnosis not present

## 2016-07-18 ENCOUNTER — Telehealth: Payer: Self-pay

## 2016-07-18 NOTE — Telephone Encounter (Signed)
Patient is on the list for [employer] 2017 and may be a good candidate for an AWV in 2017. Please let me know if/when appt is scheduled.   

## 2016-08-10 ENCOUNTER — Other Ambulatory Visit: Payer: Self-pay | Admitting: Primary Care

## 2016-09-11 ENCOUNTER — Other Ambulatory Visit: Payer: Self-pay | Admitting: Primary Care

## 2016-10-24 ENCOUNTER — Other Ambulatory Visit: Payer: Self-pay | Admitting: Primary Care

## 2016-11-03 DIAGNOSIS — I10 Essential (primary) hypertension: Secondary | ICD-10-CM | POA: Diagnosis not present

## 2016-11-03 DIAGNOSIS — Z6828 Body mass index (BMI) 28.0-28.9, adult: Secondary | ICD-10-CM | POA: Diagnosis not present

## 2016-11-03 DIAGNOSIS — J45909 Unspecified asthma, uncomplicated: Secondary | ICD-10-CM | POA: Diagnosis not present

## 2016-11-03 DIAGNOSIS — E785 Hyperlipidemia, unspecified: Secondary | ICD-10-CM | POA: Diagnosis not present

## 2016-11-03 DIAGNOSIS — Z0000 Encounter for general adult medical examination without abnormal findings: Secondary | ICD-10-CM | POA: Diagnosis not present

## 2016-11-03 DIAGNOSIS — E1165 Type 2 diabetes mellitus with hyperglycemia: Secondary | ICD-10-CM | POA: Diagnosis not present

## 2016-11-05 ENCOUNTER — Other Ambulatory Visit: Payer: Self-pay | Admitting: Primary Care

## 2016-11-05 DIAGNOSIS — E785 Hyperlipidemia, unspecified: Secondary | ICD-10-CM

## 2016-11-19 DIAGNOSIS — M545 Low back pain: Secondary | ICD-10-CM | POA: Diagnosis not present

## 2016-11-19 DIAGNOSIS — M9903 Segmental and somatic dysfunction of lumbar region: Secondary | ICD-10-CM | POA: Diagnosis not present

## 2016-11-19 DIAGNOSIS — M9906 Segmental and somatic dysfunction of lower extremity: Secondary | ICD-10-CM | POA: Diagnosis not present

## 2016-11-19 DIAGNOSIS — M9902 Segmental and somatic dysfunction of thoracic region: Secondary | ICD-10-CM | POA: Diagnosis not present

## 2016-11-19 DIAGNOSIS — M9901 Segmental and somatic dysfunction of cervical region: Secondary | ICD-10-CM | POA: Diagnosis not present

## 2016-11-19 DIAGNOSIS — M546 Pain in thoracic spine: Secondary | ICD-10-CM | POA: Diagnosis not present

## 2016-11-19 DIAGNOSIS — M542 Cervicalgia: Secondary | ICD-10-CM | POA: Diagnosis not present

## 2016-11-19 DIAGNOSIS — M25562 Pain in left knee: Secondary | ICD-10-CM | POA: Diagnosis not present

## 2016-11-24 DIAGNOSIS — M9906 Segmental and somatic dysfunction of lower extremity: Secondary | ICD-10-CM | POA: Diagnosis not present

## 2016-11-24 DIAGNOSIS — M9902 Segmental and somatic dysfunction of thoracic region: Secondary | ICD-10-CM | POA: Diagnosis not present

## 2016-11-24 DIAGNOSIS — M9903 Segmental and somatic dysfunction of lumbar region: Secondary | ICD-10-CM | POA: Diagnosis not present

## 2016-11-24 DIAGNOSIS — M545 Low back pain: Secondary | ICD-10-CM | POA: Diagnosis not present

## 2016-11-24 DIAGNOSIS — M542 Cervicalgia: Secondary | ICD-10-CM | POA: Diagnosis not present

## 2016-11-24 DIAGNOSIS — M546 Pain in thoracic spine: Secondary | ICD-10-CM | POA: Diagnosis not present

## 2016-11-24 DIAGNOSIS — M9901 Segmental and somatic dysfunction of cervical region: Secondary | ICD-10-CM | POA: Diagnosis not present

## 2016-11-24 DIAGNOSIS — M25562 Pain in left knee: Secondary | ICD-10-CM | POA: Diagnosis not present

## 2016-11-26 DIAGNOSIS — M542 Cervicalgia: Secondary | ICD-10-CM | POA: Diagnosis not present

## 2016-11-26 DIAGNOSIS — M9902 Segmental and somatic dysfunction of thoracic region: Secondary | ICD-10-CM | POA: Diagnosis not present

## 2016-11-26 DIAGNOSIS — M9901 Segmental and somatic dysfunction of cervical region: Secondary | ICD-10-CM | POA: Diagnosis not present

## 2016-11-26 DIAGNOSIS — M9903 Segmental and somatic dysfunction of lumbar region: Secondary | ICD-10-CM | POA: Diagnosis not present

## 2016-11-26 DIAGNOSIS — M25562 Pain in left knee: Secondary | ICD-10-CM | POA: Diagnosis not present

## 2016-11-26 DIAGNOSIS — M545 Low back pain: Secondary | ICD-10-CM | POA: Diagnosis not present

## 2016-11-26 DIAGNOSIS — M9906 Segmental and somatic dysfunction of lower extremity: Secondary | ICD-10-CM | POA: Diagnosis not present

## 2016-11-26 DIAGNOSIS — M546 Pain in thoracic spine: Secondary | ICD-10-CM | POA: Diagnosis not present

## 2016-11-29 ENCOUNTER — Other Ambulatory Visit: Payer: Self-pay | Admitting: Primary Care

## 2016-11-29 DIAGNOSIS — E785 Hyperlipidemia, unspecified: Secondary | ICD-10-CM

## 2016-11-29 DIAGNOSIS — Z1159 Encounter for screening for other viral diseases: Secondary | ICD-10-CM

## 2016-11-29 DIAGNOSIS — E119 Type 2 diabetes mellitus without complications: Secondary | ICD-10-CM

## 2016-11-30 ENCOUNTER — Ambulatory Visit (INDEPENDENT_AMBULATORY_CARE_PROVIDER_SITE_OTHER): Payer: Medicare HMO

## 2016-11-30 VITALS — BP 118/76 | HR 63 | Temp 98.1°F | Ht 68.0 in | Wt 190.0 lb

## 2016-11-30 DIAGNOSIS — E785 Hyperlipidemia, unspecified: Secondary | ICD-10-CM | POA: Diagnosis not present

## 2016-11-30 DIAGNOSIS — Z23 Encounter for immunization: Secondary | ICD-10-CM

## 2016-11-30 DIAGNOSIS — E119 Type 2 diabetes mellitus without complications: Secondary | ICD-10-CM

## 2016-11-30 DIAGNOSIS — Z1159 Encounter for screening for other viral diseases: Secondary | ICD-10-CM

## 2016-11-30 DIAGNOSIS — Z0000 Encounter for general adult medical examination without abnormal findings: Secondary | ICD-10-CM | POA: Diagnosis not present

## 2016-11-30 LAB — COMPREHENSIVE METABOLIC PANEL
ALT: 9 U/L (ref 0–53)
AST: 15 U/L (ref 0–37)
Albumin: 4.3 g/dL (ref 3.5–5.2)
Alkaline Phosphatase: 45 U/L (ref 39–117)
BUN: 12 mg/dL (ref 6–23)
CO2: 30 mEq/L (ref 19–32)
Calcium: 9.5 mg/dL (ref 8.4–10.5)
Chloride: 100 mEq/L (ref 96–112)
Creatinine, Ser: 0.85 mg/dL (ref 0.40–1.50)
GFR: 94.46 mL/min (ref >60.00)
Glucose, Bld: 102 mg/dL — ABNORMAL HIGH (ref 70–99)
Potassium: 3.9 mEq/L (ref 3.5–5.1)
Sodium: 136 mEq/L (ref 135–145)
Total Bilirubin: 0.5 mg/dL (ref 0.2–1.2)
Total Protein: 7 g/dL (ref 6.0–8.3)

## 2016-11-30 LAB — LIPID PANEL
Cholesterol: 123 mg/dL (ref 0–200)
HDL: 40.9 mg/dL (ref >39.00)
LDL Cholesterol: 53 mg/dL (ref 0–99)
NonHDL: 81.66
Total CHOL/HDL Ratio: 3
Triglycerides: 141 mg/dL (ref 0.0–149.0)
VLDL: 28.2 mg/dL (ref 0.0–40.0)

## 2016-11-30 LAB — MICROALBUMIN / CREATININE URINE RATIO
Creatinine,U: 51.2 mg/dL
Microalb Creat Ratio: 1.4 mg/g (ref 0.0–30.0)
Microalb, Ur: <0.7 mg/dL (ref 0.0–1.9)

## 2016-11-30 LAB — HEMOGLOBIN A1C: Hgb A1c MFr Bld: 7 % — ABNORMAL HIGH (ref 4.6–6.5)

## 2016-11-30 NOTE — Progress Notes (Signed)
Subjective:   Brian Mullen is a 71 y.o. male who presents for Medicare Annual/Subsequent preventive examination.  Review of Systems:  N/A Cardiac Risk Factors include: advanced age (>107men, >58 women);male gender;diabetes mellitus;dyslipidemia;hypertension     Objective:    Vitals: BP 118/76 (BP Location: Right Arm, Patient Position: Sitting, Cuff Size: Normal)   Pulse 63   Temp 98.1 F (36.7 C) (Oral)   Ht 5\' 8"  (1.727 m) Comment: no shoes  Wt 190 lb (86.2 kg)   SpO2 97%   BMI 28.89 kg/m   Body mass index is 28.89 kg/m.  Tobacco History  Smoking Status  . Former Smoker  Smokeless Tobacco  . Never Used     Counseling given: No   Past Medical History:  Diagnosis Date  . Allergy   . Asthma   . Diabetes mellitus without complication (HCC)   . GERD (gastroesophageal reflux disease)   . Hypertension    Past Surgical History:  Procedure Laterality Date  . ELBOW SURGERY    . KNEE SURGERY Right   . TONSILLECTOMY     Family History  Problem Relation Age of Onset  . Pulmonary embolism Father 24    Deceased  . Heart attack Mother 71    Deceased  . Diabetes Maternal Grandmother   . Heart attack Sister    History  Sexual Activity  . Sexual activity: Not on file    Outpatient Encounter Prescriptions as of 11/30/2016  Medication Sig  . albuterol (PROVENTIL HFA;VENTOLIN HFA) 108 (90 BASE) MCG/ACT inhaler Inhale 2 puffs into the lungs every 6 (six) hours as needed for wheezing or shortness of breath.  Marland Kitchen albuterol (PROVENTIL) (2.5 MG/3ML) 0.083% nebulizer solution USE 3 ML BY NEBULIZATION EVERY 6 HOURS AS NEEDED FOR WHEEZING OR SHORTNESS OF BREATH  . aspirin 81 MG tablet Take 81 mg by mouth daily.  . diclofenac (VOLTAREN) 75 MG EC tablet Take 1 tablet (75 mg total) by mouth 2 (two) times daily.  . fexofenadine (ALLEGRA) 180 MG tablet Take 180 mg by mouth daily.  . Fluticasone-Salmeterol (ADVAIR) 500-50 MCG/DOSE AEPB Inhale 1 puff into the lungs every 12 (twelve)  hours.  . Glucose Blood (BLOOD GLUCOSE TEST STRIPS) STRP USE TO CHECK BLOOD SUGAR TWICE DAILY  . losartan (COZAAR) 25 MG tablet TAKE 1 TABLET(25 MG) BY MOUTH DAILY  . metFORMIN (GLUCOPHAGE) 500 MG tablet TAKE 2 TABLETS BY MOUTH TWICE DAILY WITH A MEAL  . metoprolol succinate (TOPROL-XL) 50 MG 24 hr tablet TAKE 1 TABLET BY MOUTH EVERY DAY  . montelukast (SINGULAIR) 10 MG tablet TAKE 1 TABLET BY MOUTH EVERY NIGHT AT BEDTIME  . simvastatin (ZOCOR) 10 MG tablet TAKE 1 TABLET(10 MG) BY MOUTH AT BEDTIME  . tiotropium (SPIRIVA) 18 MCG inhalation capsule Place 1 capsule (18 mcg total) into inhaler and inhale daily.  Marland Kitchen tiZANidine (ZANAFLEX) 2 MG tablet TAKE 1 TABLET(2 MG) BY MOUTH AT BEDTIME AS NEEDED FOR MUSCLE SPASMS  . [employer] LANCETS MISC Use to check blood sugar twice daily.   No facility-administered encounter medications on file as of 11/30/2016.     Activities of Daily Living In your present state of health, do you have any difficulty performing the following activities: 11/30/2016  Hearing? Y  Vision? N  Difficulty concentrating or making decisions? Y  Walking or climbing stairs? Y  Dressing or bathing? N  Doing errands, shopping? N  Preparing Food and eating ? N  Using the Toilet? N  In the past six months, have you  accidently leaked urine? N  Do you have problems with loss of bowel control? N  Managing your Medications? N  Managing your Finances? N  Housekeeping or managing your Housekeeping? N  Some recent data might be hidden    Patient Care Team: Doreene NestKatherine K Clark, NP as PCP - General (Nurse Practitioner)   Assessment:    Hearing Screening Comments: Wears bilateral hearing aids Vision Screening Comments: Vision exam in Jan 2017  Exercise Activities and Dietary recommendations Current Exercise Habits: The patient does not participate in regular exercise at present, Exercise limited by: None identified  Goals    . Increase physical activity          Starting 11/30/16, I  will attempt to do at least 15 min of exercises provided by chiropractor.       Fall Risk Fall Risk  11/30/2016 10/07/2015 10/29/2014 09/22/2013  Falls in the past year? No No No No   Depression Screen PHQ 2/9 Scores 11/30/2016 10/07/2015 10/29/2014 09/22/2013  PHQ - 2 Score 0 0 0 0    Cognitive Function MMSE - Mini Mental State Exam 11/30/2016  Orientation to time 5  Orientation to Place 5  Registration 3  Attention/ Calculation 0  Recall 3  Language- name 2 objects 0  Language- repeat 1  Language- follow 3 step command 3  Language- read & follow direction 0  Write a sentence 0  Copy design 0  Total score 20     PLEASE NOTE: A Mini-Cog screen was completed. Maximum score is 20. A value of 0 denotes this part of [place] MMSE was not completed or the patient failed this part of the Mini-Cog screening.   Mini-Cog Screening Orientation to Time - Max 5 pts Orientation to Place - Max 5 pts Registration - Max 3 pts Recall - Max 3 pts Language Repeat - Max 1 pts Language Follow 3 Step Command - Max 3 pts     Immunization History  Administered Date(s) Administered  . Influenza,inj,Quad PF,36+ Mos 08/18/2013, 10/29/2014, 11/30/2016  . Pneumococcal Conjugate-13 09/22/2013  . Pneumococcal Polysaccharide-23 10/29/2014  . Td 09/22/2013   Screening Tests Health Maintenance  Topic Date Due  . FOOT EXAM  11/30/2017 (Originally 10/30/2015)  . OPHTHALMOLOGY EXAM  11/30/2017 (Originally 12/29/1955)  . ZOSTAVAX  11/30/2017 (Originally 12/28/2005)  . COLONOSCOPY  11/30/2026 (Originally 12/29/1995)  . HEMOGLOBIN A1C  05/30/2017  . TETANUS/TDAP  09/23/2023  . INFLUENZA VACCINE  Completed  . Hepatitis C Screening  Completed  . PNA vac Low Risk Adult  Completed      Plan:     I have personally reviewed and addressed the Medicare Annual Wellness questionnaire and have noted the following in the patient's chart:  A. Medical and social history B. Use of alcohol, tobacco or illicit drugs   C. Current medications and supplements D. Functional ability and status E.  Nutritional status F.  Physical activity G. Advance directives H. List of other physicians I.  Hospitalizations, surgeries, and ER visits in previous 12 months J.  Vitals K. Screenings to include hearing, vision, cognitive, depression L. Referrals and appointments - none  In addition, I have reviewed and discussed with patient certain preventive protocols, quality metrics, and best practice recommendations. A written personalized care plan for preventive services as well as general preventive health recommendations were provided to patient.  See attached scanned questionnaire for additional information.   Signed,   Randa EvensLesia Reina Wilton, MHA, BS, LPN Health Coach

## 2016-11-30 NOTE — Progress Notes (Signed)
Pre visit review using our clinic review tool, if applicable. No additional management support is needed unless otherwise documented below in the visit note. 

## 2016-11-30 NOTE — Progress Notes (Signed)
PCP notes:   Health maintenance:  Foot exam - PCP will complete at CPE Eye exam - pt plans to schedule future appt Colon cancer screening - pt will discuss with PCP at CPE A1C - completed Flu vaccine - administered Hep C screening - completed  Abnormal screenings:   None  Patient concerns:   None  Nurse concerns:  None  Next PCP appt:   12/02/16 @ 1100

## 2016-11-30 NOTE — Progress Notes (Signed)
I reviewed health advisor's note, was available for consultation, and agree with documentation and plan.  

## 2016-11-30 NOTE — Patient Instructions (Addendum)
**Please contact insurance regarding coverage for Shingles vaccine and Cologuard colon cancer screening.   Brian Mullen , Thank you for taking time to come for your Medicare Wellness Visit. I appreciate your ongoing commitment to your health goals. Please review the following plan we discussed and let me know if I can assist you in the future.   These are the goals we discussed: Goals    . Increase physical activity          Starting 11/30/16, I will attempt to do at least 15 min of exercises provided by chiropractor.        This is a list of the screening recommended for you and due dates:  Health Maintenance  Topic Date Due  . Complete foot exam   11/30/2017*  . Eye exam for diabetics  11/30/2017*  . Shingles Vaccine  11/30/2017*  . Colon Cancer Screening  11/30/2026*  . Hemoglobin A1C  05/30/2017  . Tetanus Vaccine  09/23/2023  . Flu Shot  Completed  .  Hepatitis C: One time screening is recommended by Center for Disease Control  (CDC) for  adults born from [birth year] through 1965.   Completed  . Pneumonia vaccines  Completed  *Topic was postponed. The date shown is not the original due date.   Preventive Care for Adults  A healthy lifestyle and preventive care can promote health and wellness. Preventive health guidelines for adults include the following key practices.  . A routine yearly physical is a good way to check with your health care provider about your health and preventive screening. It is a chance to share any concerns and updates on your health and to receive a thorough exam.  . Visit your dentist for a routine exam and preventive care every 6 months. Brush your teeth twice a day and floss once a day. Good oral hygiene prevents tooth decay and gum disease.  . The frequency of eye exams is based on your age, health, family medical history, use  of contact lenses, and other factors. Follow your health care provider's ecommendations for frequency of eye exams.  . Eat a  healthy diet. Foods like vegetables, fruits, whole grains, low-fat dairy products, and lean protein foods contain the nutrients you need without too many calories. Decrease your intake of foods high in solid fats, added sugars, and salt. Eat the right amount of calories for you. Get information about a proper diet from your health care provider, if necessary.  . Regular physical exercise is one of the most important things you can do for your health. Most adults should get at least 150 minutes of moderate-intensity exercise (any activity that increases your heart rate and causes you to sweat) each week. In addition, most adults need muscle-strengthening exercises on 2 or more days a week.  Silver Sneakers may be a benefit available to you. To determine eligibility, you may visit the website: www.silversneakers.com or contact program at [phone] Mon-Fri between 8AM-8PM.   . Maintain a healthy weight. The body mass index (BMI) is a screening tool to identify possible weight problems. It provides an estimate of body fat based on height and weight. Your health care provider can find your BMI and can help you achieve or maintain a healthy weight.   For adults 20 years and older: ? A BMI below 18.5 is considered underweight. ? A BMI of 18.5 to 24.9 is normal. ? A BMI of 25 to 29.9 is considered overweight. ? A BMI of 30 and above  is considered obese.   . Maintain normal blood lipids and cholesterol levels by exercising and minimizing your intake of saturated fat. Eat a balanced diet with plenty of fruit and vegetables. Blood tests for lipids and cholesterol should begin at age 71 and be repeated every 5 years. If your lipid or cholesterol levels are high, you are over 50, or you are at high risk for heart disease, you may need your cholesterol levels checked more frequently. Ongoing high lipid and cholesterol levels should be treated with medicines if diet and exercise are not working.  . If you  smoke, find out from your health care provider how to quit. If you do not use tobacco, please do not start.  . If you choose to drink alcohol, please do not consume more than 2 drinks per day. One drink is considered to be 12 ounces (355 mL) of beer, 5 ounces (148 mL) of wine, or 1.5 ounces (44 mL) of liquor.  . If you are 2155-71 years old, ask your health care provider if you should take aspirin to prevent strokes.  . Use sunscreen. Apply sunscreen liberally and repeatedly throughout the day. You should seek shade when your shadow is shorter than you. Protect yourself by wearing long sleeves, pants, a wide-brimmed hat, and sunglasses year round, whenever you are outdoors.  . Once a month, do a whole body skin exam, using a mirror to look at the skin on your back. Tell your health care provider of new moles, moles that have irregular borders, moles that are larger than a pencil eraser, or moles that have changed in shape or color.

## 2016-12-01 DIAGNOSIS — M9901 Segmental and somatic dysfunction of cervical region: Secondary | ICD-10-CM | POA: Diagnosis not present

## 2016-12-01 DIAGNOSIS — M542 Cervicalgia: Secondary | ICD-10-CM | POA: Diagnosis not present

## 2016-12-01 DIAGNOSIS — M545 Low back pain: Secondary | ICD-10-CM | POA: Diagnosis not present

## 2016-12-01 DIAGNOSIS — M9906 Segmental and somatic dysfunction of lower extremity: Secondary | ICD-10-CM | POA: Diagnosis not present

## 2016-12-01 DIAGNOSIS — M9903 Segmental and somatic dysfunction of lumbar region: Secondary | ICD-10-CM | POA: Diagnosis not present

## 2016-12-01 DIAGNOSIS — M25562 Pain in left knee: Secondary | ICD-10-CM | POA: Diagnosis not present

## 2016-12-01 DIAGNOSIS — M546 Pain in thoracic spine: Secondary | ICD-10-CM | POA: Diagnosis not present

## 2016-12-01 DIAGNOSIS — M9902 Segmental and somatic dysfunction of thoracic region: Secondary | ICD-10-CM | POA: Diagnosis not present

## 2016-12-01 LAB — HEPATITIS C ANTIBODY: HCV Ab: NEGATIVE

## 2016-12-02 ENCOUNTER — Encounter: Payer: Medicare HMO | Admitting: Primary Care

## 2016-12-07 DIAGNOSIS — M545 Low back pain: Secondary | ICD-10-CM | POA: Diagnosis not present

## 2016-12-07 DIAGNOSIS — M546 Pain in thoracic spine: Secondary | ICD-10-CM | POA: Diagnosis not present

## 2016-12-07 DIAGNOSIS — M25562 Pain in left knee: Secondary | ICD-10-CM | POA: Diagnosis not present

## 2016-12-07 DIAGNOSIS — M9903 Segmental and somatic dysfunction of lumbar region: Secondary | ICD-10-CM | POA: Diagnosis not present

## 2016-12-07 DIAGNOSIS — M9902 Segmental and somatic dysfunction of thoracic region: Secondary | ICD-10-CM | POA: Diagnosis not present

## 2016-12-07 DIAGNOSIS — M542 Cervicalgia: Secondary | ICD-10-CM | POA: Diagnosis not present

## 2016-12-07 DIAGNOSIS — M9901 Segmental and somatic dysfunction of cervical region: Secondary | ICD-10-CM | POA: Diagnosis not present

## 2016-12-07 DIAGNOSIS — M9906 Segmental and somatic dysfunction of lower extremity: Secondary | ICD-10-CM | POA: Diagnosis not present

## 2016-12-08 ENCOUNTER — Encounter: Payer: Self-pay | Admitting: Primary Care

## 2016-12-08 ENCOUNTER — Other Ambulatory Visit: Payer: Self-pay | Admitting: Primary Care

## 2016-12-08 MED ORDER — METFORMIN HCL 500 MG PO TABS: 1000.0000 mg | ORAL_TABLET | Freq: Two times a day (BID) | ORAL | 5 refills | Status: DC

## 2016-12-08 NOTE — Telephone Encounter (Signed)
Duplicate request

## 2016-12-08 NOTE — Telephone Encounter (Signed)
Pt left v/m requesting cb [phone]; I advised pt per email done as requested. Pt voiced understanding.

## 2016-12-09 DIAGNOSIS — M546 Pain in thoracic spine: Secondary | ICD-10-CM | POA: Diagnosis not present

## 2016-12-09 DIAGNOSIS — M9902 Segmental and somatic dysfunction of thoracic region: Secondary | ICD-10-CM | POA: Diagnosis not present

## 2016-12-09 DIAGNOSIS — M9901 Segmental and somatic dysfunction of cervical region: Secondary | ICD-10-CM | POA: Diagnosis not present

## 2016-12-09 DIAGNOSIS — M545 Low back pain: Secondary | ICD-10-CM | POA: Diagnosis not present

## 2016-12-09 DIAGNOSIS — M9903 Segmental and somatic dysfunction of lumbar region: Secondary | ICD-10-CM | POA: Diagnosis not present

## 2016-12-09 DIAGNOSIS — M542 Cervicalgia: Secondary | ICD-10-CM | POA: Diagnosis not present

## 2016-12-09 DIAGNOSIS — M25562 Pain in left knee: Secondary | ICD-10-CM | POA: Diagnosis not present

## 2016-12-09 DIAGNOSIS — M9906 Segmental and somatic dysfunction of lower extremity: Secondary | ICD-10-CM | POA: Diagnosis not present

## 2016-12-11 ENCOUNTER — Encounter: Payer: Self-pay | Admitting: Primary Care

## 2016-12-11 ENCOUNTER — Ambulatory Visit (INDEPENDENT_AMBULATORY_CARE_PROVIDER_SITE_OTHER): Payer: Medicare HMO | Admitting: Primary Care

## 2016-12-11 VITALS — BP 118/78 | HR 68 | Temp 97.7°F | Ht 68.0 in | Wt 191.8 lb

## 2016-12-11 DIAGNOSIS — E7800 Pure hypercholesterolemia, unspecified: Secondary | ICD-10-CM

## 2016-12-11 DIAGNOSIS — Z0000 Encounter for general adult medical examination without abnormal findings: Secondary | ICD-10-CM | POA: Diagnosis not present

## 2016-12-11 DIAGNOSIS — M5416 Radiculopathy, lumbar region: Secondary | ICD-10-CM

## 2016-12-11 DIAGNOSIS — E119 Type 2 diabetes mellitus without complications: Secondary | ICD-10-CM

## 2016-12-11 DIAGNOSIS — Z23 Encounter for immunization: Secondary | ICD-10-CM | POA: Diagnosis not present

## 2016-12-11 DIAGNOSIS — J4540 Moderate persistent asthma, uncomplicated: Secondary | ICD-10-CM

## 2016-12-11 DIAGNOSIS — I10 Essential (primary) hypertension: Secondary | ICD-10-CM | POA: Diagnosis not present

## 2016-12-11 MED ORDER — BUDESONIDE-FORMOTEROL FUMARATE 160-4.5 MCG/ACT IN AERO: 2.0000 | INHALATION_SPRAY | Freq: Two times a day (BID) | RESPIRATORY_TRACT | 3 refills | Status: DC

## 2016-12-11 MED ORDER — ZOSTER VACCINE LIVE 19400 UNT/0.65ML ~~LOC~~ SUSR: 0.6500 mL | Freq: Once | SUBCUTANEOUS | 0 refills | Status: AC

## 2016-12-11 NOTE — Assessment & Plan Note (Signed)
Advair too expensive, switch to Symbicort. Continue Spiriva. Continue albuterol PRN.

## 2016-12-11 NOTE — Assessment & Plan Note (Signed)
Improvement in A1C which is stable. Continue Metformin. Urine microalbumin unremarkable. Managed on ARB and statin. Foot exam completed by insurance.

## 2016-12-11 NOTE — Assessment & Plan Note (Signed)
Completed with health advisor in 2018.

## 2016-12-11 NOTE — Progress Notes (Signed)
Subjective:    Patient ID: Brian Mullen, male    DOB: November 30, 1945, 71 y.o.   MRN: [id number]  HPI  Brian Mullen is a 71 year old male who presents today for complete physical.  Immunizations: -Tetanus: Completed in 2014 -Influenza: Completed. -Pneumonia: Prevnar in 2014, Pneumovax in 2015 -Shingles: Due.  Diet: He endorses a fair diet. Breakfast: Fruit, eggs Lunch: Skips Dinner: Restaurants, mac and cheese, sandwich, chicken, steak Snacks: Cheese, pickles, olives, chips Desserts: Occasional dark chocolate Beverages: Little water, hot tea, sodas, G2 gatorade  Exercise: Active, does not exercise regularly. Eye exam: Due, will schedule for February Dental exam: Has not completed recently Colonoscopy: Due, never completed. Elects for Cologuard. PSA: Completed in 2016, negative Hep C Screen: Completed, negative.   Review of Systems  Constitutional: Negative for unexpected weight change.  HENT: Negative for rhinorrhea.        Ringing in the ears  Respiratory: Negative for cough and shortness of breath.   Cardiovascular: Negative for chest pain.  Gastrointestinal: Negative for constipation and diarrhea.  Genitourinary: Negative for difficulty urinating.  Musculoskeletal: Negative for arthralgias and myalgias.  Skin: Negative for rash.  Allergic/Immunologic: Negative for environmental allergies.  Neurological: Negative for dizziness, numbness and headaches.  Psychiatric/Behavioral:       Denies concerns for anxiety or depression       Past Medical History:  Diagnosis Date  . Allergy   . Asthma   . Diabetes mellitus without complication ([place])   . GERD (gastroesophageal reflux disease)   . Hypertension      Social History   Social History  . Marital status: Married    Spouse name: N/A  . Number of children: N/A  . Years of education: N/A   Occupational History  . Not on file.   Social History Main Topics  . Smoking status: Former Research scientist (life sciences)  . Smokeless tobacco:  Never Used  . Alcohol use 0.0 oz/week     Comment: occ  . Drug use: No  . Sexual activity: Not on file   Other Topics Concern  . Not on file   Social History Narrative   Married.   No children   Retried. Worked as a Furniture conservator/restorer.   Enjoys working on [employer], yard work, gardening.       Past Surgical History:  Procedure Laterality Date  . ELBOW SURGERY    . KNEE SURGERY Right   . TONSILLECTOMY      Family History  Problem Relation Age of Onset  . Pulmonary embolism Father 63    Deceased  . Heart attack Mother 74    Deceased  . Diabetes Maternal Grandmother   . Heart attack Sister     Allergies  Allergen Reactions  . Coconut Oil Anaphylaxis  . Iodine Anaphylaxis  . Shellfish Allergy Anaphylaxis    Current Outpatient Prescriptions on File Prior to Visit  Medication Sig Dispense Refill  . aspirin 81 MG tablet Take 81 mg by mouth daily.    . diclofenac (VOLTAREN) 75 MG EC tablet Take 1 tablet (75 mg total) by mouth 2 (two) times daily. 60 tablet 3  . fexofenadine (ALLEGRA) 180 MG tablet Take 180 mg by mouth daily.    . Glucose Blood (BLOOD GLUCOSE TEST STRIPS) STRP USE TO CHECK BLOOD SUGAR TWICE DAILY 100 each 2  . losartan (COZAAR) 25 MG tablet TAKE 1 TABLET(25 MG) BY MOUTH DAILY 90 tablet 4  . metFORMIN (GLUCOPHAGE) 500 MG tablet Take 2 tablets (  1,000 mg total) by mouth 2 (two) times daily with a meal. 120 tablet 5  . metoprolol succinate (TOPROL-XL) 50 MG 24 hr tablet TAKE 1 TABLET BY MOUTH EVERY DAY 90 tablet 1  . montelukast (SINGULAIR) 10 MG tablet TAKE 1 TABLET BY MOUTH EVERY NIGHT AT BEDTIME 30 tablet 5  . simvastatin (ZOCOR) 10 MG tablet TAKE 1 TABLET(10 MG) BY MOUTH AT BEDTIME 90 tablet 0  . tiotropium (SPIRIVA) 18 MCG inhalation capsule Place 1 capsule (18 mcg total) into inhaler and inhale daily. 30 capsule 3  . tiZANidine (ZANAFLEX) 2 MG tablet TAKE 1 TABLET(2 MG) BY MOUTH AT BEDTIME AS NEEDED FOR MUSCLE SPASMS 30 tablet 0  . [facility]  LANCETS MISC Use to check blood sugar twice daily. 100 each 2  . albuterol (PROVENTIL HFA;VENTOLIN HFA) 108 (90 BASE) MCG/ACT inhaler Inhale 2 puffs into the lungs every 6 (six) hours as needed for wheezing or shortness of breath. (Patient not taking: Reported on 12/11/2016) 1 Inhaler 3  . albuterol (PROVENTIL) (2.5 MG/3ML) 0.083% nebulizer solution USE 3 ML BY NEBULIZATION EVERY 6 HOURS AS NEEDED FOR WHEEZING OR SHORTNESS OF BREATH (Patient not taking: Reported on 12/11/2016) 975 mL 4   No current facility-administered medications on file prior to visit.     BP 118/78   Pulse 68   Temp 97.7 F (36.5 C) (Oral)   Ht 5' 8"  (1.727 m)   Wt 191 lb 12.8 oz (87 kg)   SpO2 98%   BMI 29.16 kg/m    Objective:   Physical Exam  Constitutional: He is oriented to person, place, and time. He appears well-nourished.  HENT:  Right Ear: Tympanic membrane and ear canal normal.  Left Ear: Tympanic membrane and ear canal normal.  Nose: Nose normal. Right sinus exhibits no maxillary sinus tenderness and no frontal sinus tenderness. Left sinus exhibits no maxillary sinus tenderness and no frontal sinus tenderness.  Mouth/Throat: Oropharynx is clear and moist.  Eyes: Conjunctivae and EOM are normal. Pupils are equal, round, and reactive to light.  Neck: Neck supple. Carotid bruit is not present. No thyromegaly present.  Cardiovascular: Normal rate, regular rhythm and normal heart sounds.   Pulmonary/Chest: Effort normal and breath sounds normal. He has no wheezes. He has no rales.  Abdominal: Soft. Bowel sounds are normal. There is no tenderness.  Musculoskeletal: Normal range of motion.  Neurological: He is alert and oriented to person, place, and time. He has normal reflexes. No cranial nerve deficit.  Skin: Skin is warm and dry.  Psychiatric: He has a normal mood and affect.          Assessment & Plan:

## 2016-12-11 NOTE — Assessment & Plan Note (Signed)
Immunizations UTD. Rx printed for Zostavax. PSA UTD. Colonoscopy due, signed up for Cologuard. Discussed the importance of a healthy diet and regular exercise in order for weight loss, and to reduce the risk of other medical diseases. Exam unremarkable. Labs stable. Follow up in 1 year.

## 2016-12-11 NOTE — Progress Notes (Signed)
Pre visit review using our clinic review tool, if applicable. No additional management support is needed unless otherwise documented below in the visit note. 

## 2016-12-11 NOTE — Assessment & Plan Note (Signed)
Stable on tizanidine and diclofenac PRN.

## 2016-12-11 NOTE — Assessment & Plan Note (Signed)
Stable today, continue losartan.

## 2016-12-11 NOTE — Patient Instructions (Addendum)
Stop Advair.  Start Symbicort (budesonide-formoterol) inhaler. Inhale 2 puffs into the lungs twice daily.  Continue Spiriva.   Use the albuterol inhaler only as needed for wheezing and shortness of breath.  It's important to improve your diet by reducing consumption of fast food, fried food, processed snack foods, sugary drinks. Increase consumption of fresh vegetables and fruits, whole grains, water.  Ensure you are drinking 64 ounces of water daily.  Start exercising. You should be getting 150 minutes of moderate intensity exercise weekly.  Take the shingles vaccination to your pharmacy.  I will notify you of the cologuard results once received.  Follow up in 6 months for further evaluation.  It was a pleasure to see you today!

## 2016-12-11 NOTE — Assessment & Plan Note (Signed)
Lipids stable on statin. Continue same.

## 2017-03-02 ENCOUNTER — Other Ambulatory Visit: Payer: Self-pay | Admitting: Primary Care

## 2017-03-02 DIAGNOSIS — H524 Presbyopia: Secondary | ICD-10-CM | POA: Diagnosis not present

## 2017-03-02 DIAGNOSIS — H5203 Hypermetropia, bilateral: Secondary | ICD-10-CM | POA: Diagnosis not present

## 2017-03-02 DIAGNOSIS — E785 Hyperlipidemia, unspecified: Secondary | ICD-10-CM

## 2017-03-02 DIAGNOSIS — H40013 Open angle with borderline findings, low risk, bilateral: Secondary | ICD-10-CM | POA: Diagnosis not present

## 2017-03-02 DIAGNOSIS — E119 Type 2 diabetes mellitus without complications: Secondary | ICD-10-CM | POA: Diagnosis not present

## 2017-03-02 MED ORDER — MONTELUKAST SODIUM 10 MG PO TABS: 10.0000 mg | ORAL_TABLET | Freq: Every day | ORAL | 1 refills | Status: DC

## 2017-03-02 MED ORDER — SIMVASTATIN 10 MG PO TABS: ORAL_TABLET | ORAL | 1 refills | Status: DC

## 2017-03-04 DIAGNOSIS — M546 Pain in thoracic spine: Secondary | ICD-10-CM | POA: Diagnosis not present

## 2017-03-04 DIAGNOSIS — M545 Low back pain: Secondary | ICD-10-CM | POA: Diagnosis not present

## 2017-03-04 DIAGNOSIS — M9903 Segmental and somatic dysfunction of lumbar region: Secondary | ICD-10-CM | POA: Diagnosis not present

## 2017-03-04 DIAGNOSIS — M542 Cervicalgia: Secondary | ICD-10-CM | POA: Diagnosis not present

## 2017-03-04 DIAGNOSIS — M9902 Segmental and somatic dysfunction of thoracic region: Secondary | ICD-10-CM | POA: Diagnosis not present

## 2017-03-04 DIAGNOSIS — M9901 Segmental and somatic dysfunction of cervical region: Secondary | ICD-10-CM | POA: Diagnosis not present

## 2017-03-08 DIAGNOSIS — M9902 Segmental and somatic dysfunction of thoracic region: Secondary | ICD-10-CM | POA: Diagnosis not present

## 2017-03-08 DIAGNOSIS — M9901 Segmental and somatic dysfunction of cervical region: Secondary | ICD-10-CM | POA: Diagnosis not present

## 2017-03-08 DIAGNOSIS — M25562 Pain in left knee: Secondary | ICD-10-CM | POA: Diagnosis not present

## 2017-03-08 DIAGNOSIS — M542 Cervicalgia: Secondary | ICD-10-CM | POA: Diagnosis not present

## 2017-03-08 DIAGNOSIS — M9903 Segmental and somatic dysfunction of lumbar region: Secondary | ICD-10-CM | POA: Diagnosis not present

## 2017-03-08 DIAGNOSIS — M545 Low back pain: Secondary | ICD-10-CM | POA: Diagnosis not present

## 2017-03-08 DIAGNOSIS — M9906 Segmental and somatic dysfunction of lower extremity: Secondary | ICD-10-CM | POA: Diagnosis not present

## 2017-03-08 DIAGNOSIS — M546 Pain in thoracic spine: Secondary | ICD-10-CM | POA: Diagnosis not present

## 2017-03-10 DIAGNOSIS — M9903 Segmental and somatic dysfunction of lumbar region: Secondary | ICD-10-CM | POA: Diagnosis not present

## 2017-03-10 DIAGNOSIS — M546 Pain in thoracic spine: Secondary | ICD-10-CM | POA: Diagnosis not present

## 2017-03-10 DIAGNOSIS — M545 Low back pain: Secondary | ICD-10-CM | POA: Diagnosis not present

## 2017-03-10 DIAGNOSIS — M25562 Pain in left knee: Secondary | ICD-10-CM | POA: Diagnosis not present

## 2017-03-10 DIAGNOSIS — M9902 Segmental and somatic dysfunction of thoracic region: Secondary | ICD-10-CM | POA: Diagnosis not present

## 2017-03-10 DIAGNOSIS — M542 Cervicalgia: Secondary | ICD-10-CM | POA: Diagnosis not present

## 2017-03-10 DIAGNOSIS — M9906 Segmental and somatic dysfunction of lower extremity: Secondary | ICD-10-CM | POA: Diagnosis not present

## 2017-03-10 DIAGNOSIS — M9901 Segmental and somatic dysfunction of cervical region: Secondary | ICD-10-CM | POA: Diagnosis not present

## 2017-03-15 DIAGNOSIS — M545 Low back pain: Secondary | ICD-10-CM | POA: Diagnosis not present

## 2017-03-15 DIAGNOSIS — M9906 Segmental and somatic dysfunction of lower extremity: Secondary | ICD-10-CM | POA: Diagnosis not present

## 2017-03-15 DIAGNOSIS — M546 Pain in thoracic spine: Secondary | ICD-10-CM | POA: Diagnosis not present

## 2017-03-15 DIAGNOSIS — M542 Cervicalgia: Secondary | ICD-10-CM | POA: Diagnosis not present

## 2017-03-15 DIAGNOSIS — M9902 Segmental and somatic dysfunction of thoracic region: Secondary | ICD-10-CM | POA: Diagnosis not present

## 2017-03-15 DIAGNOSIS — M9901 Segmental and somatic dysfunction of cervical region: Secondary | ICD-10-CM | POA: Diagnosis not present

## 2017-03-15 DIAGNOSIS — M25562 Pain in left knee: Secondary | ICD-10-CM | POA: Diagnosis not present

## 2017-03-15 DIAGNOSIS — M9903 Segmental and somatic dysfunction of lumbar region: Secondary | ICD-10-CM | POA: Diagnosis not present

## 2017-03-17 DIAGNOSIS — M9901 Segmental and somatic dysfunction of cervical region: Secondary | ICD-10-CM | POA: Diagnosis not present

## 2017-03-17 DIAGNOSIS — M545 Low back pain: Secondary | ICD-10-CM | POA: Diagnosis not present

## 2017-03-17 DIAGNOSIS — M9906 Segmental and somatic dysfunction of lower extremity: Secondary | ICD-10-CM | POA: Diagnosis not present

## 2017-03-17 DIAGNOSIS — M9902 Segmental and somatic dysfunction of thoracic region: Secondary | ICD-10-CM | POA: Diagnosis not present

## 2017-03-17 DIAGNOSIS — M546 Pain in thoracic spine: Secondary | ICD-10-CM | POA: Diagnosis not present

## 2017-03-17 DIAGNOSIS — M9903 Segmental and somatic dysfunction of lumbar region: Secondary | ICD-10-CM | POA: Diagnosis not present

## 2017-03-17 DIAGNOSIS — M542 Cervicalgia: Secondary | ICD-10-CM | POA: Diagnosis not present

## 2017-03-17 DIAGNOSIS — M25562 Pain in left knee: Secondary | ICD-10-CM | POA: Diagnosis not present

## 2017-03-24 ENCOUNTER — Other Ambulatory Visit: Payer: Self-pay | Admitting: Primary Care

## 2017-03-24 DIAGNOSIS — M546 Pain in thoracic spine: Secondary | ICD-10-CM | POA: Diagnosis not present

## 2017-03-24 DIAGNOSIS — M9901 Segmental and somatic dysfunction of cervical region: Secondary | ICD-10-CM | POA: Diagnosis not present

## 2017-03-24 DIAGNOSIS — M9903 Segmental and somatic dysfunction of lumbar region: Secondary | ICD-10-CM | POA: Diagnosis not present

## 2017-03-24 DIAGNOSIS — M9906 Segmental and somatic dysfunction of lower extremity: Secondary | ICD-10-CM | POA: Diagnosis not present

## 2017-03-24 DIAGNOSIS — M25562 Pain in left knee: Secondary | ICD-10-CM | POA: Diagnosis not present

## 2017-03-24 DIAGNOSIS — M9902 Segmental and somatic dysfunction of thoracic region: Secondary | ICD-10-CM | POA: Diagnosis not present

## 2017-03-24 DIAGNOSIS — M545 Low back pain: Secondary | ICD-10-CM | POA: Diagnosis not present

## 2017-03-24 DIAGNOSIS — M542 Cervicalgia: Secondary | ICD-10-CM | POA: Diagnosis not present

## 2017-03-29 ENCOUNTER — Other Ambulatory Visit: Payer: Self-pay | Admitting: Primary Care

## 2017-04-03 ENCOUNTER — Other Ambulatory Visit: Payer: Self-pay | Admitting: Primary Care

## 2017-04-05 ENCOUNTER — Other Ambulatory Visit: Payer: Self-pay | Admitting: Primary Care

## 2017-04-13 ENCOUNTER — Telehealth: Payer: Self-pay | Admitting: Primary Care

## 2017-04-13 DIAGNOSIS — M9903 Segmental and somatic dysfunction of lumbar region: Secondary | ICD-10-CM | POA: Diagnosis not present

## 2017-04-13 DIAGNOSIS — M545 Low back pain: Secondary | ICD-10-CM | POA: Diagnosis not present

## 2017-04-13 DIAGNOSIS — M9901 Segmental and somatic dysfunction of cervical region: Secondary | ICD-10-CM | POA: Diagnosis not present

## 2017-04-13 DIAGNOSIS — M9902 Segmental and somatic dysfunction of thoracic region: Secondary | ICD-10-CM | POA: Diagnosis not present

## 2017-04-13 DIAGNOSIS — M9906 Segmental and somatic dysfunction of lower extremity: Secondary | ICD-10-CM | POA: Diagnosis not present

## 2017-04-13 DIAGNOSIS — M542 Cervicalgia: Secondary | ICD-10-CM | POA: Diagnosis not present

## 2017-04-13 DIAGNOSIS — M25562 Pain in left knee: Secondary | ICD-10-CM | POA: Diagnosis not present

## 2017-04-13 DIAGNOSIS — M546 Pain in thoracic spine: Secondary | ICD-10-CM | POA: Diagnosis not present

## 2017-04-13 NOTE — Telephone Encounter (Signed)
Attempted to contact patient, left voicemail offering condolences.

## 2017-04-13 NOTE — Telephone Encounter (Signed)
Pt states he was returning clinical call.  Explained I did not see any messages, patient stated that his wife just passed away.  Best number to call pt is [phone]

## 2017-04-15 DIAGNOSIS — M542 Cervicalgia: Secondary | ICD-10-CM | POA: Diagnosis not present

## 2017-04-15 DIAGNOSIS — M9906 Segmental and somatic dysfunction of lower extremity: Secondary | ICD-10-CM | POA: Diagnosis not present

## 2017-04-15 DIAGNOSIS — M9901 Segmental and somatic dysfunction of cervical region: Secondary | ICD-10-CM | POA: Diagnosis not present

## 2017-04-15 DIAGNOSIS — M545 Low back pain: Secondary | ICD-10-CM | POA: Diagnosis not present

## 2017-04-15 DIAGNOSIS — M546 Pain in thoracic spine: Secondary | ICD-10-CM | POA: Diagnosis not present

## 2017-04-15 DIAGNOSIS — M9903 Segmental and somatic dysfunction of lumbar region: Secondary | ICD-10-CM | POA: Diagnosis not present

## 2017-04-15 DIAGNOSIS — M9902 Segmental and somatic dysfunction of thoracic region: Secondary | ICD-10-CM | POA: Diagnosis not present

## 2017-04-15 DIAGNOSIS — M25562 Pain in left knee: Secondary | ICD-10-CM | POA: Diagnosis not present

## 2017-04-19 ENCOUNTER — Encounter: Payer: Self-pay | Admitting: Primary Care

## 2017-04-19 ENCOUNTER — Ambulatory Visit (INDEPENDENT_AMBULATORY_CARE_PROVIDER_SITE_OTHER): Payer: Medicare HMO | Admitting: Primary Care

## 2017-04-19 VITALS — BP 104/56 | HR 76 | Temp 98.4°F | Ht 68.0 in | Wt 190.1 lb

## 2017-04-19 DIAGNOSIS — E7800 Pure hypercholesterolemia, unspecified: Secondary | ICD-10-CM

## 2017-04-19 DIAGNOSIS — R42 Dizziness and giddiness: Secondary | ICD-10-CM

## 2017-04-19 DIAGNOSIS — E119 Type 2 diabetes mellitus without complications: Secondary | ICD-10-CM

## 2017-04-19 DIAGNOSIS — I10 Essential (primary) hypertension: Secondary | ICD-10-CM | POA: Diagnosis not present

## 2017-04-19 LAB — BASIC METABOLIC PANEL
BUN: 20 mg/dL (ref 6–23)
CO2: 29 mEq/L (ref 19–32)
Calcium: 9 mg/dL (ref 8.4–10.5)
Chloride: 103 mEq/L (ref 96–112)
Creatinine, Ser: 0.85 mg/dL (ref 0.40–1.50)
GFR: 94.36 mL/min (ref >60.00)
Glucose, Bld: 122 mg/dL — ABNORMAL HIGH (ref 70–99)
Potassium: 4.3 mEq/L (ref 3.5–5.1)
Sodium: 138 mEq/L (ref 135–145)

## 2017-04-19 LAB — TSH: TSH: 1.06 u[IU]/mL (ref 0.35–4.50)

## 2017-04-19 LAB — HEMOGLOBIN A1C: Hgb A1c MFr Bld: 7.3 % — ABNORMAL HIGH (ref 4.6–6.5)

## 2017-04-19 MED ORDER — GLUCOSE BLOOD VI STRP: ORAL_STRIP | 5 refills | Status: DC

## 2017-04-19 NOTE — Patient Instructions (Signed)
Complete lab work prior to leaving today. I will notify you of your results once received.   Ensure you're drinking enough fluids to stay hydrated. Drinks like soda and coffee with dehydrate you. Try to drink water daily.  Check your blood pressure daily, around the same time of day, for the next 2 weeks.  Ensure that you have rested for 30 minutes prior to checking your blood pressure. Record your readings and I'll call you for those readings in 2 weeks.  Follow up in 6 months for re-evaluation.  It was a pleasure to see you today!

## 2017-04-19 NOTE — Progress Notes (Signed)
Subjective:    Patient ID: Brian Mullen, male    DOB: May 25, 1946, 71 y.o.   MRN: [id number]  HPI  Brian Mullen is a 71 year old male who presents today for follow up and a chief complaint of hypotension and lightheadedness.  His wife passed away several weeks ago from cancer. Since then he's grieving her passing and has been under a lot of stress. He's making poor food choices and is eating mostly once daily in the mid afternoon/early evening. He's experiencing dizziness most days of the week, sometimes when waking in the morning, sometimes in the later part of the day. Doesn't necessarily experience dizziness with positional changes. He's not drinking water as he mostly drinks hot tea, diet soda, and Gatorade. He's checking his blood sugars which are ranging 108-226.   He's checking his BP at home and is getting readings of 120's systolic, doesn't remember the diastolic readings. He denies vertigo symptoms. Overall he feels that he has enough support from family and friends to help him through the passing of his wife.   Wt Readings from Last 3 Encounters:  04/19/17 190 lb 1.9 oz (86.2 kg)  12/11/16 191 lb 12.8 oz (87 kg)  11/30/16 190 lb (86.2 kg)     Review of Systems  Constitutional: Negative for fatigue and fever.  Respiratory: Negative for cough and shortness of breath.   Cardiovascular: Negative for chest pain and palpitations.  Neurological: Positive for light-headedness. Negative for weakness.  Psychiatric/Behavioral:       Grieving the loss of his wife       Past Medical History:  Diagnosis Date  . Allergy   . Asthma   . Diabetes mellitus without complication ([facility])   . GERD (gastroesophageal reflux disease)   . Hypertension      Social History   Social History  . Marital status: Married    Spouse name: N/A  . Number of children: N/A  . Years of education: N/A   Occupational History  . Not on file.   Social History Main Topics  . Smoking status: Former  Games developermoker  . Smokeless tobacco: Never Used  . Alcohol use 0.0 oz/week     Comment: occ  . Drug use: No  . Sexual activity: Not on file   Other Topics Concern  . Not on file   Social History Narrative   Married.   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   Enjoys working on [employer] Power[employer] house, yard work, gardening.       Past Surgical History:  Procedure Laterality Date  . ELBOW SURGERY    . KNEE SURGERY Right   . TONSILLECTOMY      Family History  Problem Relation Age of Onset  . Pulmonary embolism Father [id number]       Deceased  . Heart attack Mother [id number]       Deceased  . Diabetes Maternal Grandmother   . Heart attack Sister     Allergies  Allergen Reactions  . Coconut Oil Anaphylaxis  . Iodine Anaphylaxis  . Shellfish Allergy Anaphylaxis  . Shellfish-Derived Products Anaphylaxis    Current Outpatient Prescriptions on File Prior to Visit  Medication Sig Dispense Refill  . albuterol (PROVENTIL HFA;VENTOLIN HFA) 108 (90 BASE) MCG/ACT inhaler Inhale 2 puffs into the lungs every 6 (six) hours as needed for wheezing or shortness of breath. 1 Inhaler 3  . aspirin 81 MG tablet Take 81 mg by mouth daily.    .Marland Kitchen  fexofenadine (ALLEGRA) 180 MG tablet Take 180 mg by mouth daily.    . Glucose Blood (BLOOD GLUCOSE TEST STRIPS) STRP USE TO CHECK BLOOD SUGAR TWICE DAILY 100 each 2  . losartan (COZAAR) 25 MG tablet TAKE 1 TABLET BY MOUTH EVERY DAY 90 tablet 0  . metFORMIN (GLUCOPHAGE) 500 MG tablet Take 2 tablets (1,000 mg total) by mouth 2 (two) times daily with a meal. 120 tablet 5  . metoprolol succinate (TOPROL-XL) 50 MG 24 hr tablet TAKE 1 TABLET BY MOUTH EVERY DAY 90 tablet 1  . montelukast (SINGULAIR) 10 MG tablet Take 1 tablet (10 mg total) by mouth at bedtime. 90 tablet 1  . simvastatin (ZOCOR) 10 MG tablet TAKE 1 TABLET(10 MG) BY MOUTH AT BEDTIME 90 tablet 1  . tiotropium (SPIRIVA) 18 MCG inhalation capsule Place 1 capsule (18 mcg total) into inhaler and inhale daily.  30 capsule 3  . tiZANidine (ZANAFLEX) 2 MG tablet TAKE 1 TABLET(2 MG) BY MOUTH AT BEDTIME AS NEEDED FOR MUSCLE SPASMS 30 tablet 0  . [employer] LANCETS MISC Use to check blood sugar twice daily. 100 each 2  . albuterol (PROVENTIL) (2.5 MG/3ML) 0.083% nebulizer solution USE 3 ML BY NEBULIZATION EVERY 6 HOURS AS NEEDED FOR WHEEZING OR SHORTNESS OF BREATH (Patient not taking: Reported on 12/11/2016) 975 mL 4  . diclofenac (VOLTAREN) 75 MG EC tablet Take 1 tablet (75 mg total) by mouth 2 (two) times daily. (Patient not taking: Reported on 04/19/2017) 60 tablet 3   No current facility-administered medications on file prior to visit.     BP (!) 104/56   Pulse 76   Temp 98.4 F (36.9 C) (Oral)   Ht 5\' 8"  (1.727 m)   Wt 190 lb 1.9 oz (86.2 kg)   SpO2 97%   BMI 28.91 kg/m    Objective:   Physical Exam  Constitutional: He appears well-nourished.  Neck: Neck supple.  Cardiovascular: Normal rate and regular rhythm.   Pulmonary/Chest: Effort normal and breath sounds normal.  Skin: Skin is warm and dry.  Psychiatric: He has a normal mood and affect.          Assessment & Plan:  Lightheadedness/dizziness:  Intermittently present for the past couple of weeks since his wife's passing. Overall blood sugars seem stable to slightly high. He is due for A1c recheck, will check this today. Orthostatic vital signs negative. Did discuss proper hydration with water. Discussed to eat 3 meals daily and to make better food choices. Likely dehydration as cause, could be grief. BMP and A1c pending.  Morrie Sheldon, NP

## 2017-04-19 NOTE — Assessment & Plan Note (Signed)
Normal, on the lower side of normal. Will have him check his blood pressure at home for the next couple of weeks, will call at that point for readings. If blood pressure remains too low then we'll consider reducing Toprol to 50 mg.

## 2017-04-19 NOTE — Assessment & Plan Note (Signed)
Lipids stable in January 2018. Continue simvastatin.

## 2017-04-19 NOTE — Assessment & Plan Note (Signed)
Overall sugars seem decent, repeat A1c today. Continue metformin. Continue ARB and statin.

## 2017-04-20 DIAGNOSIS — R69 Illness, unspecified: Secondary | ICD-10-CM | POA: Diagnosis not present

## 2017-04-20 MED ORDER — GLUCOSE BLOOD VI STRP: ORAL_STRIP | 5 refills | Status: DC

## 2017-04-20 MED ORDER — ONETOUCH ULTRASOFT LANCETS MISC: 5 refills | Status: DC

## 2017-04-20 MED ORDER — ONETOUCH ULTRA 2 W/DEVICE KIT: PACK | 0 refills | Status: DC

## 2017-04-20 NOTE — Addendum Note (Signed)
Addended by: Tawnya CrookSAMBATH, Nil Bolser on: 04/20/2017 05:11 PM   Modules accepted: Orders

## 2017-04-21 DIAGNOSIS — M9903 Segmental and somatic dysfunction of lumbar region: Secondary | ICD-10-CM | POA: Diagnosis not present

## 2017-04-21 DIAGNOSIS — M542 Cervicalgia: Secondary | ICD-10-CM | POA: Diagnosis not present

## 2017-04-21 DIAGNOSIS — M25562 Pain in left knee: Secondary | ICD-10-CM | POA: Diagnosis not present

## 2017-04-21 DIAGNOSIS — M9901 Segmental and somatic dysfunction of cervical region: Secondary | ICD-10-CM | POA: Diagnosis not present

## 2017-04-21 DIAGNOSIS — M545 Low back pain: Secondary | ICD-10-CM | POA: Diagnosis not present

## 2017-04-21 DIAGNOSIS — M546 Pain in thoracic spine: Secondary | ICD-10-CM | POA: Diagnosis not present

## 2017-04-21 DIAGNOSIS — M9906 Segmental and somatic dysfunction of lower extremity: Secondary | ICD-10-CM | POA: Diagnosis not present

## 2017-04-21 DIAGNOSIS — M9902 Segmental and somatic dysfunction of thoracic region: Secondary | ICD-10-CM | POA: Diagnosis not present

## 2017-04-27 ENCOUNTER — Encounter: Payer: Self-pay | Admitting: Adult Health

## 2017-04-27 ENCOUNTER — Ambulatory Visit (INDEPENDENT_AMBULATORY_CARE_PROVIDER_SITE_OTHER): Payer: Medicare HMO | Admitting: Adult Health

## 2017-04-27 VITALS — BP 110/72 | Temp 97.9°F | Ht 68.0 in | Wt 188.7 lb

## 2017-04-27 DIAGNOSIS — W57XXXA Bitten or stung by nonvenomous insect and other nonvenomous arthropods, initial encounter: Secondary | ICD-10-CM | POA: Diagnosis not present

## 2017-04-27 DIAGNOSIS — S30860A Insect bite (nonvenomous) of lower back and pelvis, initial encounter: Secondary | ICD-10-CM | POA: Diagnosis not present

## 2017-04-27 MED ORDER — DOXYCYCLINE HYCLATE 100 MG PO CAPS: 200.0000 mg | ORAL_CAPSULE | Freq: Once | ORAL | 0 refills | Status: AC

## 2017-04-27 NOTE — Progress Notes (Signed)
Subjective:    Patient ID: Brian Mullen, male    DOB: 07/30/1946, 71 y.o.   MRN: [id number]  HPI  71 year old male who  has a past medical history of Allergy; Asthma; Diabetes mellitus without complication ([place]); GERD (gastroesophageal reflux disease); and Hypertension.   Presents to the office today for removal of tick in the middle of his back. He is unsure of how long the tick has been attached but reports it has been "atleast 12 hours. Tick appears as adult deer tick.    Review of Systems See hpi   Past Medical History:  Diagnosis Date  . Allergy   . Asthma   . Diabetes mellitus without complication ([place])   . GERD (gastroesophageal reflux disease)   . Hypertension     Social History   Social History  . Marital status: Married    Spouse name: N/A  . Number of children: N/A  . Years of education: N/A   Occupational History  . Not on file.   Social History Main Topics  . Smoking status: Former Research scientist (life sciences)  . Smokeless tobacco: Never Used  . Alcohol use 0.0 oz/week     Comment: occ  . Drug use: No  . Sexual activity: Not on file   Other Topics Concern  . Not on file   Social History Narrative   Married.   No children   Retried. Worked as a Furniture conservator/restorer.   Enjoys working on [employer], yard work, gardening.       Past Surgical History:  Procedure Laterality Date  . ELBOW SURGERY    . KNEE SURGERY Right   . TONSILLECTOMY      Family History  Problem Relation Age of Onset  . Pulmonary embolism Father 21       Deceased  . Heart attack Mother 92       Deceased  . Diabetes Maternal Grandmother   . Heart attack Sister     Allergies  Allergen Reactions  . Coconut Oil Anaphylaxis  . Iodine Anaphylaxis  . Shellfish Allergy Anaphylaxis  . Shellfish-Derived Products Anaphylaxis    Current Outpatient Prescriptions on File Prior to Visit  Medication Sig Dispense Refill  . albuterol (PROVENTIL HFA;VENTOLIN HFA) 108 (90 BASE) MCG/ACT inhaler  Inhale 2 puffs into the lungs every 6 (six) hours as needed for wheezing or shortness of breath. 1 Inhaler 3  . albuterol (PROVENTIL) (2.5 MG/3ML) 0.083% nebulizer solution USE 3 ML BY NEBULIZATION EVERY 6 HOURS AS NEEDED FOR WHEEZING OR SHORTNESS OF BREATH 975 mL 4  . aspirin 81 MG tablet Take 81 mg by mouth daily.    . Blood Glucose Monitoring Suppl (ONE TOUCH ULTRA 2) w/Device KIT Use as instructed to blood sugar 2 times daily. 1 each 0  . diclofenac (VOLTAREN) 75 MG EC tablet Take 1 tablet (75 mg total) by mouth 2 (two) times daily. 60 tablet 3  . fexofenadine (ALLEGRA) 180 MG tablet Take 180 mg by mouth daily.    Marland Kitchen glucose blood (ONE TOUCH ULTRA TEST) test strip Use as instructed to test blood sugar 2 times daily 100 each 5  . Lancets (ONETOUCH ULTRASOFT) lancets Use as instructed to test blood sugar 2 times daily. 100 each 5  . losartan (COZAAR) 25 MG tablet TAKE 1 TABLET BY MOUTH EVERY DAY 90 tablet 0  . metFORMIN (GLUCOPHAGE) 500 MG tablet Take 2 tablets (1,000 mg total) by mouth 2 (two) times daily with a meal. 120 tablet  5  . metoprolol succinate (TOPROL-XL) 50 MG 24 hr tablet TAKE 1 TABLET BY MOUTH EVERY DAY 90 tablet 1  . montelukast (SINGULAIR) 10 MG tablet Take 1 tablet (10 mg total) by mouth at bedtime. 90 tablet 1  . simvastatin (ZOCOR) 10 MG tablet TAKE 1 TABLET(10 MG) BY MOUTH AT BEDTIME 90 tablet 1  . tiotropium (SPIRIVA) 18 MCG inhalation capsule Place 1 capsule (18 mcg total) into inhaler and inhale daily. 30 capsule 3  . tiZANidine (ZANAFLEX) 2 MG tablet TAKE 1 TABLET(2 MG) BY MOUTH AT BEDTIME AS NEEDED FOR MUSCLE SPASMS 30 tablet 0   No current facility-administered medications on file prior to visit.     BP 110/72 (BP Location: Left Arm, Patient Position: Sitting, Cuff Size: Normal)   Temp 97.9 F (36.6 C) (Oral)   Ht 5' 8"  (1.727 m)   Wt 188 lb 11.2 oz (85.6 kg)   BMI 28.69 kg/m       Objective:   Physical Exam  Constitutional: He appears well-developed and  well-nourished. No distress.  Skin: He is not diaphoretic.  Deer tick with small amount of erythema noted around tick bite.   Nursing note and vitals reviewed.     Assessment & Plan:  1. Tick bite, initial encounter - Entire tick was easily removed. Will treat with single dose of doxycycline.  - doxycycline (VIBRAMYCIN) 100 MG capsule; Take 2 capsules (200 mg total) by mouth once.  Dispense: 2 capsule; Refill: 0 - Follow up with signs of tick borne illness   Dorothyann Peng, NP

## 2017-04-28 ENCOUNTER — Ambulatory Visit: Payer: Medicare HMO | Admitting: Adult Health

## 2017-04-29 DIAGNOSIS — M9902 Segmental and somatic dysfunction of thoracic region: Secondary | ICD-10-CM | POA: Diagnosis not present

## 2017-04-29 DIAGNOSIS — M546 Pain in thoracic spine: Secondary | ICD-10-CM | POA: Diagnosis not present

## 2017-04-29 DIAGNOSIS — M545 Low back pain: Secondary | ICD-10-CM | POA: Diagnosis not present

## 2017-04-29 DIAGNOSIS — M9903 Segmental and somatic dysfunction of lumbar region: Secondary | ICD-10-CM | POA: Diagnosis not present

## 2017-04-29 DIAGNOSIS — M25562 Pain in left knee: Secondary | ICD-10-CM | POA: Diagnosis not present

## 2017-04-29 DIAGNOSIS — M9901 Segmental and somatic dysfunction of cervical region: Secondary | ICD-10-CM | POA: Diagnosis not present

## 2017-04-29 DIAGNOSIS — M9906 Segmental and somatic dysfunction of lower extremity: Secondary | ICD-10-CM | POA: Diagnosis not present

## 2017-04-29 DIAGNOSIS — M542 Cervicalgia: Secondary | ICD-10-CM | POA: Diagnosis not present

## 2017-05-03 ENCOUNTER — Telehealth: Payer: Self-pay | Admitting: Primary Care

## 2017-05-03 NOTE — Telephone Encounter (Signed)
-----   Message from Doreene NestKatherine K Emslee Lopezmartinez, NP sent at 04/19/2017  1:05 PM EDT ----- Regarding: BP Please check on BP readings. He endorsed low readings during his visit with us 2 weeks ago.

## 2017-05-03 NOTE — Telephone Encounter (Signed)
Message left for patient to return my call.  

## 2017-05-05 NOTE — Telephone Encounter (Signed)
Spoken to patient. He is busy and not at home. Will call back the next day.

## 2017-05-06 ENCOUNTER — Telehealth: Payer: Self-pay | Admitting: Primary Care

## 2017-05-06 NOTE — Telephone Encounter (Signed)
Please notify patient that I reviewed his BP readings and they look great. Continue taking his current medications for blood pressure (losartan 25 mg, metoprolol succinate 50 mg). Blood sugars are overall okay, needs to work on his diet.

## 2017-05-07 NOTE — Telephone Encounter (Signed)
Spoken and notified patient of Kate's comments. Patient verbalized understanding. 

## 2017-05-27 DIAGNOSIS — M62838 Other muscle spasm: Secondary | ICD-10-CM | POA: Diagnosis not present

## 2017-05-27 DIAGNOSIS — E119 Type 2 diabetes mellitus without complications: Secondary | ICD-10-CM | POA: Diagnosis not present

## 2017-05-27 DIAGNOSIS — I10 Essential (primary) hypertension: Secondary | ICD-10-CM | POA: Diagnosis not present

## 2017-05-27 DIAGNOSIS — M25569 Pain in unspecified knee: Secondary | ICD-10-CM | POA: Diagnosis not present

## 2017-05-27 DIAGNOSIS — Z6827 Body mass index (BMI) 27.0-27.9, adult: Secondary | ICD-10-CM | POA: Diagnosis not present

## 2017-05-27 DIAGNOSIS — E785 Hyperlipidemia, unspecified: Secondary | ICD-10-CM | POA: Diagnosis not present

## 2017-05-27 DIAGNOSIS — I499 Cardiac arrhythmia, unspecified: Secondary | ICD-10-CM | POA: Diagnosis not present

## 2017-05-27 DIAGNOSIS — Z79899 Other long term (current) drug therapy: Secondary | ICD-10-CM | POA: Diagnosis not present

## 2017-05-27 DIAGNOSIS — J45909 Unspecified asthma, uncomplicated: Secondary | ICD-10-CM | POA: Diagnosis not present

## 2017-05-27 DIAGNOSIS — Z0000 Encounter for general adult medical examination without abnormal findings: Secondary | ICD-10-CM | POA: Diagnosis not present

## 2017-05-27 DIAGNOSIS — G4709 Other insomnia: Secondary | ICD-10-CM | POA: Diagnosis not present

## 2017-05-31 DIAGNOSIS — M542 Cervicalgia: Secondary | ICD-10-CM | POA: Diagnosis not present

## 2017-05-31 DIAGNOSIS — M545 Low back pain: Secondary | ICD-10-CM | POA: Diagnosis not present

## 2017-05-31 DIAGNOSIS — R69 Illness, unspecified: Secondary | ICD-10-CM | POA: Diagnosis not present

## 2017-05-31 DIAGNOSIS — M546 Pain in thoracic spine: Secondary | ICD-10-CM | POA: Diagnosis not present

## 2017-05-31 DIAGNOSIS — M9901 Segmental and somatic dysfunction of cervical region: Secondary | ICD-10-CM | POA: Diagnosis not present

## 2017-05-31 DIAGNOSIS — M9902 Segmental and somatic dysfunction of thoracic region: Secondary | ICD-10-CM | POA: Diagnosis not present

## 2017-05-31 DIAGNOSIS — M25562 Pain in left knee: Secondary | ICD-10-CM | POA: Diagnosis not present

## 2017-05-31 DIAGNOSIS — M9903 Segmental and somatic dysfunction of lumbar region: Secondary | ICD-10-CM | POA: Diagnosis not present

## 2017-05-31 DIAGNOSIS — M9906 Segmental and somatic dysfunction of lower extremity: Secondary | ICD-10-CM | POA: Diagnosis not present

## 2017-06-03 DIAGNOSIS — M545 Low back pain: Secondary | ICD-10-CM | POA: Diagnosis not present

## 2017-06-03 DIAGNOSIS — M546 Pain in thoracic spine: Secondary | ICD-10-CM | POA: Diagnosis not present

## 2017-06-03 DIAGNOSIS — M542 Cervicalgia: Secondary | ICD-10-CM | POA: Diagnosis not present

## 2017-06-03 DIAGNOSIS — M9901 Segmental and somatic dysfunction of cervical region: Secondary | ICD-10-CM | POA: Diagnosis not present

## 2017-06-03 DIAGNOSIS — M9902 Segmental and somatic dysfunction of thoracic region: Secondary | ICD-10-CM | POA: Diagnosis not present

## 2017-06-03 DIAGNOSIS — M9903 Segmental and somatic dysfunction of lumbar region: Secondary | ICD-10-CM | POA: Diagnosis not present

## 2017-06-04 DIAGNOSIS — Z0100 Encounter for examination of eyes and vision without abnormal findings: Secondary | ICD-10-CM | POA: Diagnosis not present

## 2017-06-10 ENCOUNTER — Ambulatory Visit: Payer: Medicare HMO | Admitting: Primary Care

## 2017-07-03 ENCOUNTER — Other Ambulatory Visit: Payer: Self-pay | Admitting: Primary Care

## 2017-07-18 DIAGNOSIS — R69 Illness, unspecified: Secondary | ICD-10-CM | POA: Diagnosis not present

## 2017-08-01 ENCOUNTER — Other Ambulatory Visit: Payer: Self-pay | Admitting: Primary Care

## 2017-08-02 MED ORDER — ALBUTEROL SULFATE HFA 108 (90 BASE) MCG/ACT IN AERS: 2.0000 | INHALATION_SPRAY | Freq: Four times a day (QID) | RESPIRATORY_TRACT | 3 refills | Status: DC | PRN

## 2017-08-02 NOTE — Addendum Note (Signed)
Addended by: Tawnya Crook on: 08/02/2017 11:09 AM   Modules accepted: Orders

## 2017-08-04 DIAGNOSIS — M545 Low back pain: Secondary | ICD-10-CM | POA: Diagnosis not present

## 2017-08-04 DIAGNOSIS — M9901 Segmental and somatic dysfunction of cervical region: Secondary | ICD-10-CM | POA: Diagnosis not present

## 2017-08-04 DIAGNOSIS — M546 Pain in thoracic spine: Secondary | ICD-10-CM | POA: Diagnosis not present

## 2017-08-04 DIAGNOSIS — M542 Cervicalgia: Secondary | ICD-10-CM | POA: Diagnosis not present

## 2017-08-04 DIAGNOSIS — M9902 Segmental and somatic dysfunction of thoracic region: Secondary | ICD-10-CM | POA: Diagnosis not present

## 2017-08-04 DIAGNOSIS — M9903 Segmental and somatic dysfunction of lumbar region: Secondary | ICD-10-CM | POA: Diagnosis not present

## 2017-08-06 DIAGNOSIS — M9901 Segmental and somatic dysfunction of cervical region: Secondary | ICD-10-CM | POA: Diagnosis not present

## 2017-08-06 DIAGNOSIS — M9902 Segmental and somatic dysfunction of thoracic region: Secondary | ICD-10-CM | POA: Diagnosis not present

## 2017-08-06 DIAGNOSIS — M545 Low back pain: Secondary | ICD-10-CM | POA: Diagnosis not present

## 2017-08-06 DIAGNOSIS — R69 Illness, unspecified: Secondary | ICD-10-CM | POA: Diagnosis not present

## 2017-08-06 DIAGNOSIS — M542 Cervicalgia: Secondary | ICD-10-CM | POA: Diagnosis not present

## 2017-08-06 DIAGNOSIS — M546 Pain in thoracic spine: Secondary | ICD-10-CM | POA: Diagnosis not present

## 2017-08-06 DIAGNOSIS — M9903 Segmental and somatic dysfunction of lumbar region: Secondary | ICD-10-CM | POA: Diagnosis not present

## 2017-08-10 DIAGNOSIS — M9903 Segmental and somatic dysfunction of lumbar region: Secondary | ICD-10-CM | POA: Diagnosis not present

## 2017-08-10 DIAGNOSIS — M542 Cervicalgia: Secondary | ICD-10-CM | POA: Diagnosis not present

## 2017-08-10 DIAGNOSIS — M9901 Segmental and somatic dysfunction of cervical region: Secondary | ICD-10-CM | POA: Diagnosis not present

## 2017-08-10 DIAGNOSIS — M9902 Segmental and somatic dysfunction of thoracic region: Secondary | ICD-10-CM | POA: Diagnosis not present

## 2017-08-10 DIAGNOSIS — M546 Pain in thoracic spine: Secondary | ICD-10-CM | POA: Diagnosis not present

## 2017-08-10 DIAGNOSIS — M545 Low back pain: Secondary | ICD-10-CM | POA: Diagnosis not present

## 2017-08-12 DIAGNOSIS — M546 Pain in thoracic spine: Secondary | ICD-10-CM | POA: Diagnosis not present

## 2017-08-12 DIAGNOSIS — M9901 Segmental and somatic dysfunction of cervical region: Secondary | ICD-10-CM | POA: Diagnosis not present

## 2017-08-12 DIAGNOSIS — M545 Low back pain: Secondary | ICD-10-CM | POA: Diagnosis not present

## 2017-08-12 DIAGNOSIS — M9902 Segmental and somatic dysfunction of thoracic region: Secondary | ICD-10-CM | POA: Diagnosis not present

## 2017-08-12 DIAGNOSIS — M9903 Segmental and somatic dysfunction of lumbar region: Secondary | ICD-10-CM | POA: Diagnosis not present

## 2017-08-12 DIAGNOSIS — M542 Cervicalgia: Secondary | ICD-10-CM | POA: Diagnosis not present

## 2017-08-17 DIAGNOSIS — M546 Pain in thoracic spine: Secondary | ICD-10-CM | POA: Diagnosis not present

## 2017-08-17 DIAGNOSIS — M542 Cervicalgia: Secondary | ICD-10-CM | POA: Diagnosis not present

## 2017-08-17 DIAGNOSIS — M9902 Segmental and somatic dysfunction of thoracic region: Secondary | ICD-10-CM | POA: Diagnosis not present

## 2017-08-17 DIAGNOSIS — M9903 Segmental and somatic dysfunction of lumbar region: Secondary | ICD-10-CM | POA: Diagnosis not present

## 2017-08-17 DIAGNOSIS — M9901 Segmental and somatic dysfunction of cervical region: Secondary | ICD-10-CM | POA: Diagnosis not present

## 2017-08-17 DIAGNOSIS — M545 Low back pain: Secondary | ICD-10-CM | POA: Diagnosis not present

## 2017-08-19 ENCOUNTER — Other Ambulatory Visit: Payer: Self-pay | Admitting: Primary Care

## 2017-08-19 DIAGNOSIS — M9902 Segmental and somatic dysfunction of thoracic region: Secondary | ICD-10-CM | POA: Diagnosis not present

## 2017-08-19 DIAGNOSIS — M546 Pain in thoracic spine: Secondary | ICD-10-CM | POA: Diagnosis not present

## 2017-08-19 DIAGNOSIS — E785 Hyperlipidemia, unspecified: Secondary | ICD-10-CM

## 2017-08-19 DIAGNOSIS — M545 Low back pain: Secondary | ICD-10-CM | POA: Diagnosis not present

## 2017-08-19 DIAGNOSIS — M9903 Segmental and somatic dysfunction of lumbar region: Secondary | ICD-10-CM | POA: Diagnosis not present

## 2017-08-19 DIAGNOSIS — M9901 Segmental and somatic dysfunction of cervical region: Secondary | ICD-10-CM | POA: Diagnosis not present

## 2017-08-19 DIAGNOSIS — M542 Cervicalgia: Secondary | ICD-10-CM | POA: Diagnosis not present

## 2017-08-24 DIAGNOSIS — M542 Cervicalgia: Secondary | ICD-10-CM | POA: Diagnosis not present

## 2017-08-24 DIAGNOSIS — M545 Low back pain: Secondary | ICD-10-CM | POA: Diagnosis not present

## 2017-08-24 DIAGNOSIS — M9901 Segmental and somatic dysfunction of cervical region: Secondary | ICD-10-CM | POA: Diagnosis not present

## 2017-08-24 DIAGNOSIS — M9902 Segmental and somatic dysfunction of thoracic region: Secondary | ICD-10-CM | POA: Diagnosis not present

## 2017-08-24 DIAGNOSIS — M9903 Segmental and somatic dysfunction of lumbar region: Secondary | ICD-10-CM | POA: Diagnosis not present

## 2017-08-24 DIAGNOSIS — M546 Pain in thoracic spine: Secondary | ICD-10-CM | POA: Diagnosis not present

## 2017-08-26 DIAGNOSIS — M9903 Segmental and somatic dysfunction of lumbar region: Secondary | ICD-10-CM | POA: Diagnosis not present

## 2017-08-26 DIAGNOSIS — M542 Cervicalgia: Secondary | ICD-10-CM | POA: Diagnosis not present

## 2017-08-26 DIAGNOSIS — M546 Pain in thoracic spine: Secondary | ICD-10-CM | POA: Diagnosis not present

## 2017-08-26 DIAGNOSIS — M9901 Segmental and somatic dysfunction of cervical region: Secondary | ICD-10-CM | POA: Diagnosis not present

## 2017-08-26 DIAGNOSIS — M545 Low back pain: Secondary | ICD-10-CM | POA: Diagnosis not present

## 2017-08-26 DIAGNOSIS — M9902 Segmental and somatic dysfunction of thoracic region: Secondary | ICD-10-CM | POA: Diagnosis not present

## 2017-09-15 ENCOUNTER — Encounter (HOSPITAL_COMMUNITY): Payer: Self-pay | Admitting: Emergency Medicine

## 2017-09-15 ENCOUNTER — Emergency Department (HOSPITAL_COMMUNITY)
Admission: EM | Admit: 2017-09-15 | Discharge: 2017-09-15 | Disposition: A | Discharge status: Home / Self Care | Payer: Medicare HMO | Attending: Emergency Medicine | Admitting: Emergency Medicine

## 2017-09-15 ENCOUNTER — Emergency Department (HOSPITAL_COMMUNITY): Payer: Medicare HMO

## 2017-09-15 DIAGNOSIS — R000 Tachycardia, unspecified: Secondary | ICD-10-CM | POA: Diagnosis not present

## 2017-09-15 DIAGNOSIS — Z87891 Personal history of nicotine dependence: Secondary | ICD-10-CM | POA: Diagnosis not present

## 2017-09-15 DIAGNOSIS — Z7982 Long term (current) use of aspirin: Secondary | ICD-10-CM | POA: Insufficient documentation

## 2017-09-15 DIAGNOSIS — J45909 Unspecified asthma, uncomplicated: Secondary | ICD-10-CM | POA: Diagnosis not present

## 2017-09-15 DIAGNOSIS — Z7984 Long term (current) use of oral hypoglycemic drugs: Secondary | ICD-10-CM | POA: Insufficient documentation

## 2017-09-15 DIAGNOSIS — E876 Hypokalemia: Secondary | ICD-10-CM | POA: Diagnosis not present

## 2017-09-15 DIAGNOSIS — E119 Type 2 diabetes mellitus without complications: Secondary | ICD-10-CM | POA: Diagnosis not present

## 2017-09-15 LAB — BASIC METABOLIC PANEL
Anion gap: 11 (ref 5–15)
BUN: 13 mg/dL (ref 6–20)
CO2: 27 mmol/L (ref 22–32)
Calcium: 9.1 mg/dL (ref 8.9–10.3)
Chloride: 99 mmol/L — ABNORMAL LOW (ref 101–111)
Creatinine, Ser: 0.93 mg/dL (ref 0.61–1.24)
GFR calc Af Amer: >60 mL/min (ref >60)
GFR calc non Af Amer: >60 mL/min (ref >60)
Glucose, Bld: 161 mg/dL — ABNORMAL HIGH (ref 65–99)
Potassium: 3.4 mmol/L — ABNORMAL LOW (ref 3.5–5.1)
Sodium: 137 mmol/L (ref 135–145)

## 2017-09-15 LAB — I-STAT TROPONIN, ED: Troponin i, poc: 0 ng/mL (ref 0.00–0.08)

## 2017-09-15 LAB — CBC
HCT: 43.2 % (ref 39.0–52.0)
Hemoglobin: 14.6 g/dL (ref 13.0–17.0)
MCH: 30.2 pg (ref 26.0–34.0)
MCHC: 33.8 g/dL (ref 30.0–36.0)
MCV: 89.4 fL (ref 78.0–100.0)
Platelets: 279 10*3/uL (ref 150–400)
RBC: 4.83 MIL/uL (ref 4.22–5.81)
RDW: 13 % (ref 11.5–15.5)
WBC: 8.7 10*3/uL (ref 4.0–10.5)

## 2017-09-15 LAB — MAGNESIUM: Magnesium: 1.9 mg/dL (ref 1.7–2.4)

## 2017-09-15 LAB — TROPONIN I: Troponin I: <0.03 ng/mL (ref <0.03)

## 2017-09-15 MED ORDER — POTASSIUM CHLORIDE CRYS ER 20 MEQ PO TBCR
40.0000 meq | EXTENDED_RELEASE_TABLET | Freq: Once | ORAL | Status: AC
  Administered 2017-09-15: 40 meq via ORAL
  Filled 2017-09-15: qty 2

## 2017-09-15 MED ORDER — POTASSIUM CHLORIDE ER 20 MEQ PO TBCR: 20.0000 meq | EXTENDED_RELEASE_TABLET | Freq: Two times a day (BID) | ORAL | 0 refills | Status: DC

## 2017-09-15 NOTE — ED Triage Notes (Signed)
Patient woke around 12 am felt heart was racing check pulse 97, rechecked 1/2 hour later and heart rate increased 88-104.  No chest pain or shortness of breath verbalized.

## 2017-09-15 NOTE — ED Provider Notes (Signed)
[facility] EMERGENCY DEPARTMENT Provider Note   CSN: [id number] Arrival date & time: 09/15/17  0215  Time seen 03:00 AM  History   Chief Complaint Chief Complaint  Patient presents with  . Tachycardia    HPI Brian Mullen is a 71 y.o. male.  HPI she reports he was awakened at midnight and felt like his heart was racing.  When he checked his heart rate it was 97.  He states his heart rate is normally in the 60s.  He denies chest pain, diaphoresis, nausea, vomiting, dizziness, lightheadedness or feeling weak.  He states he did feel a little short of breath and felt it was from his asthma and used his inhaler.  He states his shortness of breath now is also gone.  He states he checked his heart rate and it continued to get faster, it was 104 then it was 127 and then it was 135 and then it was 141 at which point he came to the ED.  He states his CBG was 130 and his blood pressure was normal.  He denies any prior cardiac problems.  He states the feeling of his heart racing stopped after he was placed in the treatment room.  He states his feeling of his heart racing lasted about 3 hours.  His heart rate during my exam was 89.  He states he has had similar episodes a few times over the past couple years however normally he just rests and it goes away.  He states when that happens a heart rate is usually in the 90s up to 102.  He denies any thing different in his activity or his medications including over-the-counter medications.  He states many years ago he felt like his heart was skipping and he saw cardiologist, he was told he was having extra beats.  There was no specific treatment done.  PCP Pleas Koch, NP  At [facility]  Past Medical History:  Diagnosis Date  . Allergy   . Asthma   . Diabetes mellitus without complication ([place])   . GERD (gastroesophageal reflux disease)   . Hypertension     Patient Active Problem List   Diagnosis Date Noted  . Preventative health care  12/11/2016  . Shoulder pain 01/21/2016  . Medicare annual wellness visit, subsequent 10/07/2015  . Insomnia 10/07/2015  . Asthma 03/14/2015  . Type 2 diabetes mellitus ([place]) 10/29/2014  . Pure hypercholesterolemia 10/29/2014  . Lumbar radiculopathy, chronic 09/22/2013  . Essential hypertension 08/18/2013    Past Surgical History:  Procedure Laterality Date  . ELBOW SURGERY    . KNEE SURGERY Right   . TONSILLECTOMY         Home Medications    Prior to Admission medications   Medication Sig Start Date End Date Taking? Authorizing Provider  albuterol (PROVENTIL HFA;VENTOLIN HFA) 108 (90 Base) MCG/ACT inhaler Inhale 2 puffs into the lungs every 6 (six) hours as needed for wheezing or shortness of breath. 08/02/17  Yes Pleas Koch, NP  aspirin 81 MG tablet Take 81 mg by mouth daily.   Yes [provider]  Blood Glucose Monitoring Suppl (ONE TOUCH ULTRA 2) w/Device KIT Use as instructed to blood sugar 2 times daily. 04/20/17  Yes Pleas Koch, NP  fexofenadine (ALLEGRA) 180 MG tablet Take 180 mg by mouth daily.   Yes [provider]  glucose blood (ONE TOUCH ULTRA TEST) test strip Use as instructed to test blood sugar 2 times daily 04/20/17  Yes  Pleas Koch, NP  Lancets [facility] ULTRASOFT) lancets Use as instructed to test blood sugar 2 times daily. 04/20/17  Yes Pleas Koch, NP  losartan (COZAAR) 25 MG tablet TAKE 1 TABLET BY MOUTH EVERY DAY 07/05/17  Yes Pleas Koch, NP  metFORMIN (GLUCOPHAGE) 500 MG tablet Take 2 tablets (1,000 mg total) by mouth 2 (two) times daily with a meal. 12/08/16  Yes Pleas Koch, NP  metoprolol succinate (TOPROL-XL) 50 MG 24 hr tablet TAKE 1 TABLET BY MOUTH EVERY DAY 03/29/17  Yes Pleas Koch, NP  montelukast (SINGULAIR) 10 MG tablet TAKE 1 TABLET (10 MG TOTAL) BY MOUTH AT BEDTIME. 08/02/17  Yes Pleas Koch, NP  simvastatin (ZOCOR) 10 MG tablet TAKE 1 TABLET(10 MG) BY MOUTH AT BEDTIME 08/19/17  Yes  Pleas Koch, NP  tiotropium (SPIRIVA) 18 MCG inhalation capsule Place 1 capsule (18 mcg total) into inhaler and inhale daily. 10/29/14  Yes Dutch Quint B, FNP  tiZANidine (ZANAFLEX) 2 MG tablet TAKE 1 TABLET(2 MG) BY MOUTH AT BEDTIME AS NEEDED FOR MUSCLE SPASMS 11/19/15  Yes Pleas Koch, NP  diclofenac (VOLTAREN) 75 MG EC tablet Take 1 tablet (75 mg total) by mouth 2 (two) times daily. 10/29/14   Dutch Quint B, FNP  potassium chloride 20 MEQ TBCR Take 20 mEq by mouth 2 (two) times daily. 09/15/17   Rolland Porter, MD    Family History Family History  Problem Relation Age of Onset  . Pulmonary embolism Father 36       Deceased  . Heart attack Mother 54       Deceased  . Diabetes Maternal Grandmother   . Heart attack Sister     Social History Social History  Substance Use Topics  . Smoking status: Former Research scientist (life sciences)  . Smokeless tobacco: Never Used  . Alcohol use 0.0 oz/week     Comment: occ  lives at home Lives alone (widowed in May) + second hand smoke from wife   Allergies   Coconut oil; Iodine; Shellfish allergy; and Shellfish-derived products   Review of Systems Review of Systems  All other systems reviewed and are negative.    Physical Exam Updated Vital Signs BP (!) 152/94 (BP Location: Left Arm)   Pulse 96   Temp 98.2 F (36.8 C) (Oral)   Resp 16   Ht 5' 9"  (1.753 m)   Wt 86.2 kg (190 lb)   SpO2 97%   BMI 28.06 kg/m   Vital signs normal except borderline hypertension   Physical Exam  Constitutional: He is oriented to person, place, and time. He appears well-developed and well-nourished.  Non-toxic appearance. He does not appear ill. No distress.  HENT:  Head: Normocephalic and atraumatic.  Right Ear: External ear normal.  Left Ear: External ear normal.  Nose: Nose normal. No mucosal edema or rhinorrhea.  Mouth/Throat: Oropharynx is clear and moist and mucous membranes are normal. No dental abscesses or uvula swelling.  Eyes: Pupils are  equal, round, and reactive to light. Conjunctivae and EOM are normal.  Neck: Normal range of motion and full passive range of motion without pain. Neck supple.  Cardiovascular: Normal rate, regular rhythm and normal heart sounds.  Exam reveals no gallop and no friction rub.   No murmur heard. Pulmonary/Chest: Effort normal and breath sounds normal. No respiratory distress. He has no wheezes. He has no rhonchi. He has no rales. He exhibits no tenderness and no crepitus.  Abdominal: Soft. Normal appearance and bowel sounds  are normal. He exhibits no distension. There is no tenderness. There is no rebound and no guarding.  Musculoskeletal: Normal range of motion. He exhibits no edema or tenderness.  Moves all extremities well.   Neurological: He is alert and oriented to person, place, and time. He has normal strength. No cranial nerve deficit.  Skin: Skin is warm, dry and intact. No rash noted. No erythema. No pallor.  Psychiatric: He has a normal mood and affect. His speech is normal and behavior is normal. His mood appears not anxious.  Nursing note and vitals reviewed.    ED Treatments / Results  Labs (all labs ordered are listed, but only abnormal results are displayed) Results for orders placed or performed during the hospital encounter of 30/05/11  Basic metabolic panel  Result Value Ref Range   Sodium 137 135 - 145 mmol/L   Potassium 3.4 (L) 3.5 - 5.1 mmol/L   Chloride 99 (L) 101 - 111 mmol/L   CO2 27 22 - 32 mmol/L   Glucose, Bld 161 (H) 65 - 99 mg/dL   BUN 13 6 - 20 mg/dL   Creatinine, Ser 0.93 0.61 - 1.24 mg/dL   Calcium 9.1 8.9 - 10.3 mg/dL   GFR calc non Af Amer >60 >60 mL/min   GFR calc Af Amer >60 >60 mL/min   Anion gap 11 5 - 15  CBC  Result Value Ref Range   WBC 8.7 4.0 - 10.5 K/uL   RBC 4.83 4.22 - 5.81 MIL/uL   Hemoglobin 14.6 13.0 - 17.0 g/dL   HCT 43.2 39.0 - 52.0 %   MCV 89.4 78.0 - 100.0 fL   MCH 30.2 26.0 - 34.0 pg   MCHC 33.8 30.0 - 36.0 g/dL   RDW 13.0  11.5 - 15.5 %   Platelets 279 150 - 400 K/uL  Magnesium  Result Value Ref Range   Magnesium 1.9 1.7 - 2.4 mg/dL  Troponin I  Result Value Ref Range   Troponin I <0.03 <0.03 ng/mL  I-stat troponin, ED  Result Value Ref Range   Troponin i, poc 0.00 0.00 - 0.08 ng/mL   Comment 3           Laboratory interpretation all normal except mild hypokalemia    EKG  EKG Interpretation  Date/Time:  Wednesday September 15 2017 02:28:01 EDT Ventricular Rate:  97 PR Interval:    QRS Duration: 95 QT Interval:  359 QTC Calculation: 456 R Axis:   73 Text Interpretation:  Sinus rhythm Q  waves inferior leads No old tracing to compare Confirmed by Rolland Porter [id number]) on 09/15/2017 2:36:16 AM       Radiology Dg Chest 2 View  Result Date: 09/15/2017 CLINICAL DATA:  71 year old male with tachycardia. EXAM: CHEST  2 VIEW COMPARISON:  None. FINDINGS: The heart size and mediastinal contours are within normal limits. Both lungs are clear. The visualized skeletal structures are unremarkable. IMPRESSION: No active cardiopulmonary disease. Electronically Signed   By: Anner Crete M.D.   On: 09/15/2017 03:42    Procedures Procedures (including critical care time)  Medications Ordered in ED Medications  potassium chloride SA (K-DUR,KLOR-CON) CR tablet 40 mEq (40 mEq Oral Given 09/15/17 0433)     Initial Impression / Assessment and Plan / ED Course  I have reviewed the triage vital signs and the nursing notes.  Pertinent labs & imaging results that were available during my care of the patient were reviewed by me and considered in my medical decision making (  see chart for details).    Laboratory testing was done to look for any electrolyte abnormality which could make his heart irritable or have tachycardia.  Cardiac enzymes were done to rule out myocardial ischemia.  Patient was noted to have a very mild hypokalemia.  He was started on oral potassium.  Patient's delta troponin is negative for  acute reactive event.  He was discharged home with oral potassium pills.  He should follow-up with cardiology for further evaluation of his tachycardia.  At the time of my last interview with him his heart rate was 68.  He said his heart rate is normally in the 60s.  Final Clinical Impressions(s) / ED Diagnoses   Final diagnoses:  Tachycardia  Hypokalemia    New Prescriptions New Prescriptions   POTASSIUM CHLORIDE 20 MEQ TBCR    Take 20 mEq by mouth 2 (two) times daily.    Plan discharge  Rolland Porter, MD, Barbette Or, MD 09/15/17 0630

## 2017-09-15 NOTE — Discharge Instructions (Signed)
Take the potassium pills until gone. Call the cardiology office to get an appointment to have the cardiologist evaluate your racing heart. Return to the ED if you get chest pain, shortness of breath, get sweating, nauseated or light headed when your heart races. Avoid caffeine.

## 2017-09-17 ENCOUNTER — Ambulatory Visit (INDEPENDENT_AMBULATORY_CARE_PROVIDER_SITE_OTHER): Payer: Medicare HMO | Admitting: Cardiovascular Disease

## 2017-09-17 ENCOUNTER — Encounter: Payer: Self-pay | Admitting: Cardiovascular Disease

## 2017-09-17 VITALS — BP 128/74 | HR 79 | Ht 69.0 in | Wt 182.0 lb

## 2017-09-17 DIAGNOSIS — I10 Essential (primary) hypertension: Secondary | ICD-10-CM

## 2017-09-17 DIAGNOSIS — Z9289 Personal history of other medical treatment: Secondary | ICD-10-CM | POA: Diagnosis not present

## 2017-09-17 DIAGNOSIS — R000 Tachycardia, unspecified: Secondary | ICD-10-CM | POA: Diagnosis not present

## 2017-09-17 DIAGNOSIS — E785 Hyperlipidemia, unspecified: Secondary | ICD-10-CM

## 2017-09-17 NOTE — Patient Instructions (Signed)
Your physician recommends that you schedule a follow-up appointment in:  As needed with Dr.Koneswaran    Your physician recommends that you continue on your current medications as directed. Please refer to the Current Medication list given to you today.     No lab work or tests ordered today.       Thank you for choosing [facility] !         

## 2017-09-17 NOTE — Progress Notes (Signed)
CARDIOLOGY CONSULT NOTE  Patient ID: Brian Mullen MRN: [id number] DOB/AGE: 01/11/1946 71 y.o.  Admit date: (Not on file) Primary Physician: Pleas Koch, NP Referring Physician: Pleas Koch, NP  Reason for Consultation: Tachycardia  HPI: Brian Mullen is a 71 y.o. male who is being seen today for the evaluation of tachycardia at the request of Pleas Koch, NP.   Past medical history includes hypertension and diabetes.  He was evaluated in the ED for tachycardia 2 days ago.  I reviewed all relevant documentation, labs, and studies.  He awakened at midnight and felt like his heart was racing.  He checked it and it was 97 bpm.  He said his heart rate is normally in the 60 bpm range.  He denied associated chest pain, diaphoresis, nausea, vomiting, and dizziness.  He did have some mild shortness of breath but thought it was due to his asthma and tried using his inhaler.  This apparently alleviated the shortness of breath.  His heart rate climbed to 104, 127, 135, and eventually 141 bpm at which point he went to the ED.  There is no evidence of infection with a normal white blood cell count.  He was not anemic.  Hemoglobin was normal at 14.6.  Troponins were normal.  Magnesium was normal at 1.9.  Potassium was mildly low at 3.4.  Chest x-ray showed no evidence of active cardia pulmonary disease.  He was given supplemental potassium.  ECG which I personally interpreted demonstrated sinus rhythm, 97 bpm.  There were no ischemic abnormalities.  On the day prior to going to the ED, he drank 3 cups of tea in the morning and drank a coffee with Kahla in the evening.  He had some loose bowel movements thereafter.  He then experienced tachycardia and was found to be hypokalemic.  He came home and drank a 32 ounce Gatorade with reduced sugar and felt better.  He is originally from [place].  He moved to [place] 6 years ago.  Unfortunately, his wife was  diagnosed with lung cancer in 01/28/17 and passed away the following 2023-04-28.  He denies exertional chest pain.  He has some asthma which occasionally leads to shortness of breath but is fairly well controlled overall.  He has been on Toprol-XL for several years due to symptomatic PVCs.  He underwent a stress test in [place] several years ago.  He denies a history of MI.  Family history: Father died at age 71 of coronary artery disease.  Mother died suddenly at the age of 109.   Allergies  Allergen Reactions  . Coconut Oil Anaphylaxis  . Iodine Anaphylaxis  . Shellfish Allergy Anaphylaxis  . Shellfish-Derived Products Anaphylaxis    Current Outpatient Prescriptions  Medication Sig Dispense Refill  . albuterol (PROVENTIL HFA;VENTOLIN HFA) 108 (90 Base) MCG/ACT inhaler Inhale 2 puffs into the lungs every 6 (six) hours as needed for wheezing or shortness of breath. 1 Inhaler 3  . aspirin 81 MG tablet Take 81 mg by mouth daily.    . Blood Glucose Monitoring Suppl (ONE TOUCH ULTRA 2) w/Device KIT Use as instructed to blood sugar 2 times daily. 1 each 0  . diclofenac (VOLTAREN) 75 MG EC tablet Take 1 tablet (75 mg total) by mouth 2 (two) times daily. 60 tablet 3  . fexofenadine (ALLEGRA) 180 MG tablet Take 180 mg by mouth daily.    Marland Kitchen glucose blood (ONE TOUCH ULTRA TEST) test  strip Use as instructed to test blood sugar 2 times daily 100 each 5  . Lancets (ONETOUCH ULTRASOFT) lancets Use as instructed to test blood sugar 2 times daily. 100 each 5  . losartan (COZAAR) 25 MG tablet TAKE 1 TABLET BY MOUTH EVERY DAY 90 tablet 0  . metFORMIN (GLUCOPHAGE) 500 MG tablet Take 2 tablets (1,000 mg total) by mouth 2 (two) times daily with a meal. 120 tablet 5  . metoprolol succinate (TOPROL-XL) 50 MG 24 hr tablet TAKE 1 TABLET BY MOUTH EVERY DAY 90 tablet 1  . montelukast (SINGULAIR) 10 MG tablet TAKE 1 TABLET (10 MG TOTAL) BY MOUTH AT BEDTIME. 90 tablet 1  . potassium chloride 20 MEQ TBCR Take 20  mEq by mouth 2 (two) times daily. 6 tablet 0  . simvastatin (ZOCOR) 10 MG tablet TAKE 1 TABLET(10 MG) BY MOUTH AT BEDTIME 90 tablet 1  . tiotropium (SPIRIVA) 18 MCG inhalation capsule Place 1 capsule (18 mcg total) into inhaler and inhale daily. 30 capsule 3  . tiZANidine (ZANAFLEX) 2 MG tablet TAKE 1 TABLET(2 MG) BY MOUTH AT BEDTIME AS NEEDED FOR MUSCLE SPASMS 30 tablet 0   No current facility-administered medications for this visit.     Past Medical History:  Diagnosis Date  . Allergy   . Asthma   . Diabetes mellitus without complication ([place])   . GERD (gastroesophageal reflux disease)   . Hypertension     Past Surgical History:  Procedure Laterality Date  . ELBOW SURGERY    . KNEE SURGERY Right   . TONSILLECTOMY      Social History   Social History  . Marital status: Married    Spouse name: N/A  . Number of children: N/A  . Years of education: N/A   Occupational History  . Not on file.   Social History Main Topics  . Smoking status: Former Research scientist (life sciences)  . Smokeless tobacco: Never Used  . Alcohol use 0.0 oz/week     Comment: occ  . Drug use: No  . Sexual activity: Not on file   Other Topics Concern  . Not on file   Social History Narrative   Married.   No children   Retried. Worked as a Furniture conservator/restorer.   Enjoys working on [employer], yard work, gardening.         Current Meds  Medication Sig  . albuterol (PROVENTIL HFA;VENTOLIN HFA) 108 (90 Base) MCG/ACT inhaler Inhale 2 puffs into the lungs every 6 (six) hours as needed for wheezing or shortness of breath.  Marland Kitchen aspirin 81 MG tablet Take 81 mg by mouth daily.  . Blood Glucose Monitoring Suppl (ONE TOUCH ULTRA 2) w/Device KIT Use as instructed to blood sugar 2 times daily.  . diclofenac (VOLTAREN) 75 MG EC tablet Take 1 tablet (75 mg total) by mouth 2 (two) times daily.  . fexofenadine (ALLEGRA) 180 MG tablet Take 180 mg by mouth daily.  Marland Kitchen glucose blood (ONE TOUCH ULTRA TEST) test strip Use as  instructed to test blood sugar 2 times daily  . Lancets (ONETOUCH ULTRASOFT) lancets Use as instructed to test blood sugar 2 times daily.  Marland Kitchen losartan (COZAAR) 25 MG tablet TAKE 1 TABLET BY MOUTH EVERY DAY  . metFORMIN (GLUCOPHAGE) 500 MG tablet Take 2 tablets (1,000 mg total) by mouth 2 (two) times daily with a meal.  . metoprolol succinate (TOPROL-XL) 50 MG 24 hr tablet TAKE 1 TABLET BY MOUTH EVERY DAY  . montelukast (SINGULAIR) 10 MG  tablet TAKE 1 TABLET (10 MG TOTAL) BY MOUTH AT BEDTIME.  Marland Kitchen potassium chloride 20 MEQ TBCR Take 20 mEq by mouth 2 (two) times daily.  . simvastatin (ZOCOR) 10 MG tablet TAKE 1 TABLET(10 MG) BY MOUTH AT BEDTIME  . tiotropium (SPIRIVA) 18 MCG inhalation capsule Place 1 capsule (18 mcg total) into inhaler and inhale daily.  Marland Kitchen tiZANidine (ZANAFLEX) 2 MG tablet TAKE 1 TABLET(2 MG) BY MOUTH AT BEDTIME AS NEEDED FOR MUSCLE SPASMS      Review of systems complete and found to be negative unless listed above in HPI    Physical exam Blood pressure 128/74, pulse 79, height 5' 9"  (1.753 m), weight 182 lb (82.6 kg), SpO2 98 %. General: NAD Neck: No JVD, no thyromegaly or thyroid nodule.  Lungs: Clear to auscultation bilaterally with normal respiratory effort. CV: Nondisplaced PMI. Regular rate and rhythm, normal S1/S2, no S3/S4, no murmur.  No peripheral edema.  No carotid bruit.    Abdomen: Soft, nontender, no distention.  Skin: Intact without lesions or rashes.  Neurologic: Alert and oriented x 3.  Psych: Normal affect. Extremities: No clubbing or cyanosis.  HEENT: Normal.   ECG: Most recent ECG reviewed.   Labs: Lab Results  Component Value Date/Time   K 3.4 (L) 09/15/2017 02:45 AM   BUN 13 09/15/2017 02:45 AM   CREATININE 0.93 09/15/2017 02:45 AM   ALT 9 11/30/2016 11:59 AM   TSH 1.06 04/19/2017 12:46 PM   HGB 14.6 09/15/2017 02:45 AM     Lipids: Lab Results  Component Value Date/Time   LDLCALC 53 11/30/2016 11:59 AM   LDLDIRECT 151.6 09/22/2013  09:15 AM   CHOL 123 11/30/2016 11:59 AM   TRIG 141.0 11/30/2016 11:59 AM   HDL 40.90 11/30/2016 11:59 AM        ASSESSMENT AND PLAN:  1.  Tachycardia: Heart rate is normal today.  He takes Toprol-XL 50 mg daily for symptomatic PVCs.  Symptoms are stable today.  He was mildly hypokalemic in the ED and was given supplemental potassium.  TSH was normal at 1.06 on 04/19/17.  2.  Hyperlipidemia: Lipids reviewed from 11/30/16, with total cholesterol 123, triglycerides 141, HDL 40, LDL 53.  He is on low-dose statin therapy for type 2 diabetes.  3.  Hypertension: Controlled.  No changes to therapy.     Disposition: Follow up as needed Lonell Grandchild Signed: Kate Sable, M.D., F.A.C.C.  09/17/2017, 3:53 PM

## 2017-09-25 DIAGNOSIS — R69 Illness, unspecified: Secondary | ICD-10-CM | POA: Diagnosis not present

## 2017-10-03 ENCOUNTER — Other Ambulatory Visit: Payer: Self-pay | Admitting: Primary Care

## 2017-10-14 ENCOUNTER — Other Ambulatory Visit: Payer: Self-pay | Admitting: Primary Care

## 2017-10-19 ENCOUNTER — Telehealth: Payer: Self-pay | Admitting: Primary Care

## 2017-10-19 ENCOUNTER — Ambulatory Visit: Payer: Medicare HMO | Admitting: Primary Care

## 2017-10-19 NOTE — Telephone Encounter (Signed)
Noted, no fee. Thanks.

## 2017-10-19 NOTE — Telephone Encounter (Signed)
Copied from CRM [phone]. Topic: Quick Communication - Appointment Cancellation >> Oct 19, 2017 12:26 PM Diana EvesHoyt, Maryann B wrote: Patient called to cancel appointment scheduled for 10/19/17. Patient has rescheduled their appointment.    Route to department's PEC pool. >> Oct 19, 2017 12:28 PM Diana EvesHoyt, Maryann B wrote: Pt called 10/19/17 after appt time  >> Oct 19, 2017 12:40 PM Desmond DikeKnight, Waynetta H, CMA wrote: Would you like to charge cancellation fee. 1130 slot was not filled by alternate pt

## 2017-10-22 ENCOUNTER — Ambulatory Visit: Payer: Medicare HMO | Admitting: Primary Care

## 2017-11-17 DIAGNOSIS — M9903 Segmental and somatic dysfunction of lumbar region: Secondary | ICD-10-CM | POA: Diagnosis not present

## 2017-11-17 DIAGNOSIS — M546 Pain in thoracic spine: Secondary | ICD-10-CM | POA: Diagnosis not present

## 2017-11-17 DIAGNOSIS — M545 Low back pain: Secondary | ICD-10-CM | POA: Diagnosis not present

## 2017-11-17 DIAGNOSIS — M9906 Segmental and somatic dysfunction of lower extremity: Secondary | ICD-10-CM | POA: Diagnosis not present

## 2017-11-17 DIAGNOSIS — M542 Cervicalgia: Secondary | ICD-10-CM | POA: Diagnosis not present

## 2017-11-17 DIAGNOSIS — M9901 Segmental and somatic dysfunction of cervical region: Secondary | ICD-10-CM | POA: Diagnosis not present

## 2017-11-17 DIAGNOSIS — M9902 Segmental and somatic dysfunction of thoracic region: Secondary | ICD-10-CM | POA: Diagnosis not present

## 2017-11-23 DIAGNOSIS — M9903 Segmental and somatic dysfunction of lumbar region: Secondary | ICD-10-CM | POA: Diagnosis not present

## 2017-11-23 DIAGNOSIS — M9906 Segmental and somatic dysfunction of lower extremity: Secondary | ICD-10-CM | POA: Diagnosis not present

## 2017-11-23 DIAGNOSIS — M546 Pain in thoracic spine: Secondary | ICD-10-CM | POA: Diagnosis not present

## 2017-11-23 DIAGNOSIS — M9902 Segmental and somatic dysfunction of thoracic region: Secondary | ICD-10-CM | POA: Diagnosis not present

## 2017-11-23 DIAGNOSIS — M542 Cervicalgia: Secondary | ICD-10-CM | POA: Diagnosis not present

## 2017-11-23 DIAGNOSIS — M545 Low back pain: Secondary | ICD-10-CM | POA: Diagnosis not present

## 2017-11-23 DIAGNOSIS — M9901 Segmental and somatic dysfunction of cervical region: Secondary | ICD-10-CM | POA: Diagnosis not present

## 2017-12-12 ENCOUNTER — Other Ambulatory Visit: Payer: Self-pay | Admitting: Primary Care

## 2018-01-25 DIAGNOSIS — M545 Low back pain: Secondary | ICD-10-CM | POA: Diagnosis not present

## 2018-01-25 DIAGNOSIS — M9906 Segmental and somatic dysfunction of lower extremity: Secondary | ICD-10-CM | POA: Diagnosis not present

## 2018-01-25 DIAGNOSIS — M9901 Segmental and somatic dysfunction of cervical region: Secondary | ICD-10-CM | POA: Diagnosis not present

## 2018-01-25 DIAGNOSIS — M9902 Segmental and somatic dysfunction of thoracic region: Secondary | ICD-10-CM | POA: Diagnosis not present

## 2018-01-25 DIAGNOSIS — M9903 Segmental and somatic dysfunction of lumbar region: Secondary | ICD-10-CM | POA: Diagnosis not present

## 2018-01-25 DIAGNOSIS — M542 Cervicalgia: Secondary | ICD-10-CM | POA: Diagnosis not present

## 2018-01-25 DIAGNOSIS — M546 Pain in thoracic spine: Secondary | ICD-10-CM | POA: Diagnosis not present

## 2018-01-27 DIAGNOSIS — M9901 Segmental and somatic dysfunction of cervical region: Secondary | ICD-10-CM | POA: Diagnosis not present

## 2018-01-27 DIAGNOSIS — M545 Low back pain: Secondary | ICD-10-CM | POA: Diagnosis not present

## 2018-01-27 DIAGNOSIS — M9902 Segmental and somatic dysfunction of thoracic region: Secondary | ICD-10-CM | POA: Diagnosis not present

## 2018-01-27 DIAGNOSIS — M546 Pain in thoracic spine: Secondary | ICD-10-CM | POA: Diagnosis not present

## 2018-01-27 DIAGNOSIS — M9906 Segmental and somatic dysfunction of lower extremity: Secondary | ICD-10-CM | POA: Diagnosis not present

## 2018-01-27 DIAGNOSIS — M542 Cervicalgia: Secondary | ICD-10-CM | POA: Diagnosis not present

## 2018-01-27 DIAGNOSIS — M9903 Segmental and somatic dysfunction of lumbar region: Secondary | ICD-10-CM | POA: Diagnosis not present

## 2018-02-01 DIAGNOSIS — M546 Pain in thoracic spine: Secondary | ICD-10-CM | POA: Diagnosis not present

## 2018-02-01 DIAGNOSIS — M9901 Segmental and somatic dysfunction of cervical region: Secondary | ICD-10-CM | POA: Diagnosis not present

## 2018-02-01 DIAGNOSIS — M9902 Segmental and somatic dysfunction of thoracic region: Secondary | ICD-10-CM | POA: Diagnosis not present

## 2018-02-01 DIAGNOSIS — M542 Cervicalgia: Secondary | ICD-10-CM | POA: Diagnosis not present

## 2018-02-01 DIAGNOSIS — M9903 Segmental and somatic dysfunction of lumbar region: Secondary | ICD-10-CM | POA: Diagnosis not present

## 2018-02-01 DIAGNOSIS — M545 Low back pain: Secondary | ICD-10-CM | POA: Diagnosis not present

## 2018-02-03 DIAGNOSIS — M546 Pain in thoracic spine: Secondary | ICD-10-CM | POA: Diagnosis not present

## 2018-02-03 DIAGNOSIS — M9906 Segmental and somatic dysfunction of lower extremity: Secondary | ICD-10-CM | POA: Diagnosis not present

## 2018-02-03 DIAGNOSIS — M9903 Segmental and somatic dysfunction of lumbar region: Secondary | ICD-10-CM | POA: Diagnosis not present

## 2018-02-03 DIAGNOSIS — M9901 Segmental and somatic dysfunction of cervical region: Secondary | ICD-10-CM | POA: Diagnosis not present

## 2018-02-03 DIAGNOSIS — M9902 Segmental and somatic dysfunction of thoracic region: Secondary | ICD-10-CM | POA: Diagnosis not present

## 2018-02-03 DIAGNOSIS — M542 Cervicalgia: Secondary | ICD-10-CM | POA: Diagnosis not present

## 2018-02-03 DIAGNOSIS — M545 Low back pain: Secondary | ICD-10-CM | POA: Diagnosis not present

## 2018-02-10 ENCOUNTER — Ambulatory Visit: Payer: Self-pay

## 2018-02-10 ENCOUNTER — Other Ambulatory Visit: Payer: Self-pay | Admitting: Primary Care

## 2018-02-10 NOTE — Telephone Encounter (Signed)
  Patient called in with c/o "passing out."  He says "it happened around 0400 this morning. I was up all night about every hour with leg cramps. I drank water, walked, then back to bed. About 0200 I got up an took the pills Tizanidine 2 mg for my leg cramps and went back to bed. At 0300 I got up and sit on the side of the bed with leg cramps, went to the BR and went back to bed. I woke up at 0400 with leg cramps again, I went to use the BR, felt lightheaded, I turned to sit down and the next thing I know, I am waking up on the floor of the bathroom. I have a red mark from my left temple to my ear and my ear has 2 cuts. The blood was dried, so I figured I was out a few minutes. I got up and was a little disoriented as to why I was on the floor, but I knew who I was and where I was. I checked my blood sugar and it was 198. I stayed up for a while after that then fell off to sleep. I got up with leg cramps again and took the medicine at 1000. Right now, I am fine, no dizziness/lightheadness, no pain, no vomiting/nausea, diarrhea, nothing. I am up cleaning up." According to protocol, see PCP within 4 hours, but it was edited based on my clinical judgement to 24 hours, no available appointments with provider, appointment made for tomorrow at 1515 with Dr. Ermalene SearingBedsole, care advice given, patient verbalized understanding.  Reason for Disposition . [1] All other patients AND [2] now alert and feels fine(Exception: SIMPLE FAINT due to stress, pain, prolonged standing, or suddenly standing)  Answer Assessment - Initial Assessment Questions 1. ONSET: "How long were you unconscious?" (minutes) "When did it happen?"     No idea 2. CONTENT: "What happened during period of unconsciousness?" (e.g., seizure activity)      I don't know 3. MENTAL STATUS: "Alert and oriented now?" (oriented x 3 = name, month, location)      Yes 4. TRIGGER: "What do you think caused the fainting?" "What were you doing just before you fainted?"   (e.g., exercise, sudden standing up, prolonged standing)     Not sure; going to BR 5. RECURRENT SYMPTOM: "Have you ever passed out before?" If so, ask: "When was the last time?" and "What happened that time?"      Over 30-40 years ago 6. INJURY: "Did you sustain any injury during the fall?"      Hit door frame and cut left ear 7. CARDIAC SYMPTOMS: "Have you had any of the following symptoms: chest pain, difficulty breathing, palpitations?"     Tachycardia last year 8. NEUROLOGIC SYMPTOMS: "Have you had any of the following symptoms: headache, numbness, vertigo, weakness?"     Felt lightheaded 9. GI SYMPTOMS: "Have you had any of the following symptoms: abdominal pain, vomiting, diarrhea, blood in stools?"     No 10. OTHER SYMPTOMS: "Do you have any other symptoms?"      Nothing other than feeling lightheaded before the passing out 11. PREGNANCY: "Is there any chance you are pregnant?" "When was your last menstrual period?"       N/A  Protocols used: [facility]

## 2018-02-11 ENCOUNTER — Ambulatory Visit: Payer: Medicare HMO | Admitting: Family Medicine

## 2018-02-11 ENCOUNTER — Encounter: Payer: Self-pay | Admitting: Primary Care

## 2018-02-11 ENCOUNTER — Other Ambulatory Visit: Payer: Self-pay | Admitting: Primary Care

## 2018-02-11 ENCOUNTER — Ambulatory Visit (INDEPENDENT_AMBULATORY_CARE_PROVIDER_SITE_OTHER): Payer: Medicare HMO | Admitting: Primary Care

## 2018-02-11 ENCOUNTER — Ambulatory Visit
Admission: RE | Admit: 2018-02-11 | Discharge: 2018-02-11 | Disposition: A | Discharge status: Home / Self Care | Payer: Medicare HMO | Source: Ambulatory Visit | Attending: Primary Care | Admitting: Primary Care

## 2018-02-11 VITALS — BP 112/60 | HR 72 | Temp 98.1°F | Ht 69.0 in | Wt 185.0 lb

## 2018-02-11 DIAGNOSIS — G44319 Acute post-traumatic headache, not intractable: Secondary | ICD-10-CM

## 2018-02-11 DIAGNOSIS — M9902 Segmental and somatic dysfunction of thoracic region: Secondary | ICD-10-CM | POA: Diagnosis not present

## 2018-02-11 DIAGNOSIS — E119 Type 2 diabetes mellitus without complications: Secondary | ICD-10-CM | POA: Diagnosis not present

## 2018-02-11 DIAGNOSIS — R55 Syncope and collapse: Secondary | ICD-10-CM | POA: Diagnosis not present

## 2018-02-11 DIAGNOSIS — M9901 Segmental and somatic dysfunction of cervical region: Secondary | ICD-10-CM | POA: Diagnosis not present

## 2018-02-11 DIAGNOSIS — M9906 Segmental and somatic dysfunction of lower extremity: Secondary | ICD-10-CM | POA: Diagnosis not present

## 2018-02-11 DIAGNOSIS — E118 Type 2 diabetes mellitus with unspecified complications: Secondary | ICD-10-CM

## 2018-02-11 DIAGNOSIS — I10 Essential (primary) hypertension: Secondary | ICD-10-CM | POA: Diagnosis not present

## 2018-02-11 DIAGNOSIS — M542 Cervicalgia: Secondary | ICD-10-CM | POA: Diagnosis not present

## 2018-02-11 DIAGNOSIS — R51 Headache: Secondary | ICD-10-CM | POA: Diagnosis not present

## 2018-02-11 DIAGNOSIS — M545 Low back pain: Secondary | ICD-10-CM | POA: Diagnosis not present

## 2018-02-11 DIAGNOSIS — M9903 Segmental and somatic dysfunction of lumbar region: Secondary | ICD-10-CM | POA: Diagnosis not present

## 2018-02-11 DIAGNOSIS — M546 Pain in thoracic spine: Secondary | ICD-10-CM | POA: Diagnosis not present

## 2018-02-11 MED ORDER — METOPROLOL SUCCINATE ER 25 MG PO TB24: ORAL_TABLET | ORAL | 0 refills | Status: DC

## 2018-02-11 MED ORDER — LOSARTAN POTASSIUM 25 MG PO TABS: 25.0000 mg | ORAL_TABLET | Freq: Every day | ORAL | 0 refills | Status: DC

## 2018-02-11 NOTE — Progress Notes (Signed)
Subjective:    Patient ID: Brian Mullen, male    DOB: August 01, 1946, 72 y.o.   MRN: [id number]  HPI  Brian Mullen is a 72 year old male with a history of chronic back pain, hypertension, type 2 diabetes who presents today with a chief complaint of syncope. He's not been to our office for diabetes follow up in 9 months.   Wednesday evening he woke twice during the night with mild cramping to the lower extremities bilaterally. Thursday morning at 2 am he woke up with severe cramping to bilateral lower extremities (left hip down to left ankle, right hip to right calf). He took one of his Tizanidine 2 mg tablets then returned to bed. He woke at 4 am ambulated to the bathroom, turned around, felt light headed, and then found himself on the floor. He was by himself so he's not sure how long he was laying on the floor.   He hit the left side of his head on the corner of the bathroom door before landing on the floor. He's noticed red marks to the left side of his face. Yesterday he had a "bad" headache with some "unsteady" feeling, and noticed vision being "off" to left eye. Today his headache has improved and he's feeling less lightheaded, continues to notice vision to left eye being "off". He was afraid to drive to his visit today.  He did have some epigastric discomfort 2 days ago, but attributes this to eating pasta that evening as it has since resolved. He drinks Gatorade, soda, hot tea during the day. Little water consumption. He denies chest pain.   He's checking his blood sugars once to twice daily on average when he feels "weird". Recent readings of: 240, 198, 150's, 160's, 90's.   Review of Systems  Constitutional: Negative for fever.  Eyes: Positive for visual disturbance.  Cardiovascular: Negative for chest pain.  Musculoskeletal:       Bilateral lower extremity cramping  Neurological: Positive for light-headedness and headaches. Negative for weakness.       Past Medical History:    Diagnosis Date  . Allergy   . Asthma   . Diabetes mellitus without complication ([place])   . GERD (gastroesophageal reflux disease)   . Hypertension      Social History   Socioeconomic History  . Marital status: Married    Spouse name: Not on file  . Number of children: Not on file  . Years of education: Not on file  . Highest education level: Not on file  Occupational History  . Not on file  Social Needs  . Financial resource strain: Not on file  . Food insecurity:    Worry: Not on file    Inability: Not on file  . Transportation needs:    Medical: Not on file    Non-medical: Not on file  Tobacco Use  . Smoking status: Former Research scientist (life sciences)  . Smokeless tobacco: Never Used  Substance and Sexual Activity  . Alcohol use: Yes    Alcohol/week: 0.0 oz    Comment: occ  . Drug use: No  . Sexual activity: Not on file  Lifestyle  . Physical activity:    Days per week: Not on file    Minutes per session: Not on file  . Stress: Not on file  Relationships  . Social connections:    Talks on phone: Not on file    Gets together: Not on file    Attends religious service: Not on file  Active member of club or organization: Not on file    Attends meetings of clubs or organizations: Not on file    Relationship status: Not on file  . Intimate partner violence:    Fear of current or ex partner: Not on file    Emotionally abused: Not on file    Physically abused: Not on file    Forced sexual activity: Not on file  Other Topics Concern  . Not on file  Social History Narrative   Married.   No children   Retried. Worked as a Furniture conservator/restorer.   Enjoys working on [employer], yard work, gardening.    Past Surgical History:  Procedure Laterality Date  . ELBOW SURGERY    . KNEE SURGERY Right   . TONSILLECTOMY      Family History  Problem Relation Age of Onset  . Pulmonary embolism Father 16       Deceased  . Heart attack Mother 9       Deceased  . Diabetes Maternal  Grandmother   . Heart attack Sister     Allergies  Allergen Reactions  . Coconut Oil Anaphylaxis  . Iodine Anaphylaxis  . Shellfish Allergy Anaphylaxis  . Shellfish-Derived Products Anaphylaxis    Current Outpatient Medications on File Prior to Visit  Medication Sig Dispense Refill  . albuterol (PROVENTIL HFA;VENTOLIN HFA) 108 (90 Base) MCG/ACT inhaler Inhale 2 puffs into the lungs every 6 (six) hours as needed for wheezing or shortness of breath. 1 Inhaler 3  . aspirin 81 MG tablet Take 81 mg by mouth daily.    . Blood Glucose Monitoring Suppl (ONE TOUCH ULTRA 2) w/Device KIT Use as instructed to blood sugar 2 times daily. 1 each 0  . fexofenadine (ALLEGRA) 180 MG tablet Take 180 mg by mouth daily.    Marland Kitchen glucose blood (ONE TOUCH ULTRA TEST) test strip Use as instructed to test blood sugar 2 times daily 100 each 5  . Lancets (ONETOUCH ULTRASOFT) lancets Use as instructed to test blood sugar 2 times daily. 100 each 5  . losartan (COZAAR) 25 MG tablet Take 1 tablet (25 mg total) by mouth daily. NEED APPOINTMENT FOR ANY MORE REFILLS 90 tablet 0  . metFORMIN (GLUCOPHAGE) 500 MG tablet TAKE 2 TABLETS (1,000 MG TOTAL) BY MOUTH 2 (TWO) TIMES DAILY WITH A MEAL. 120 tablet 5  . metoprolol succinate (TOPROL-XL) 50 MG 24 hr tablet TAKE 1 TABLET BY MOUTH EVERY DAY 90 tablet 1  . montelukast (SINGULAIR) 10 MG tablet TAKE 1 TABLET (10 MG TOTAL) BY MOUTH AT BEDTIME. 90 tablet 1  . tiotropium (SPIRIVA) 18 MCG inhalation capsule Place 1 capsule (18 mcg total) into inhaler and inhale daily. 30 capsule 3  . tiZANidine (ZANAFLEX) 2 MG tablet TAKE 1 TABLET(2 MG) BY MOUTH AT BEDTIME AS NEEDED FOR MUSCLE SPASMS 30 tablet 0  . potassium chloride 20 MEQ TBCR Take 20 mEq by mouth 2 (two) times daily. (Patient not taking: Reported on 02/11/2018) 6 tablet 0  . simvastatin (ZOCOR) 10 MG tablet TAKE 1 TABLET(10 MG) BY MOUTH AT BEDTIME (Patient not taking: Reported on 02/11/2018) 90 tablet 1   No current  facility-administered medications on file prior to visit.     BP 112/60 (BP Location: Left Arm, Patient Position: Sitting, Cuff Size: Normal)   Pulse 72   Temp 98.1 F (36.7 C) (Oral)   Ht _0  (1.753 m)   Wt 185 lb (83.9 kg)   SpO2 97%  BMI 27.32 kg/m    Objective:   Physical Exam  Constitutional: He is oriented to person, place, and time. He appears well-nourished.  HENT:  Head:    Red marks to left temporal head  Eyes: Pupils are equal, round, and reactive to light. EOM are normal.  Neck: Neck supple.  Cardiovascular: Normal rate and regular rhythm.  Pulmonary/Chest: Effort normal.  Neurological: He is alert and oriented to person, place, and time. No cranial nerve deficit. Coordination normal.  Skin: Skin is warm and dry.          Assessment & Plan:

## 2018-02-11 NOTE — Patient Instructions (Signed)
Stop by the front desk and speak with either Shirlee LimerickMarion or Anastasiya regarding your CT scan.   Stop by the lab prior to leaving today. I will notify you of your results once received.   Please go to the hospital if you develop dizziness, increased headache, continued visual changes, chest pain.  It was a pleasure to see you today!

## 2018-02-11 NOTE — Assessment & Plan Note (Addendum)
Occurred yesterday 2 hours after taking muscle relaxer for bilateral lower extremity cramping, episode could be secondary to medication.   Also consider orthostatic hypotension, although orthostatic vitals are negative today. Managed on losartan 25 mg for renal protection against diabetes. Will reduce metoprolol to 25 mg once daily. Based off of glucose readings, doubt hypoglycemic episode especially given that he's managed on Metformin only.  ECG today with NSR, rate of 70, no ST elevation or depression. Neuro exam unremarkable today.   Check labs including CBC, CMP, A1C. Stat CT head ordered to rule out hemorrhage. Await results.   Strict emergency department precautions provided for over the weekend, he verbalized understanding.

## 2018-02-11 NOTE — Assessment & Plan Note (Addendum)
Overdue for follow up. Repeat A1C pending today. Doesn't seem like recent syncope is from hypoglycemia.

## 2018-02-11 NOTE — Assessment & Plan Note (Addendum)
Recent lower readings. Will continue losartan 25 mg for renal protection, reduce dose of metoprolol to 25 mg and closely monitor.

## 2018-02-12 LAB — HEMOGLOBIN A1C
Hgb A1c MFr Bld: 6.5 % of total Hgb — ABNORMAL HIGH (ref <5.7)
Mean Plasma Glucose: 140 (calc)
eAG (mmol/L): 7.7 (calc)

## 2018-02-12 LAB — COMPREHENSIVE METABOLIC PANEL WITH GFR
AG Ratio: 1.7 (calc) (ref 1.0–2.5)
ALT: 10 U/L (ref 9–46)
AST: 16 U/L (ref 10–35)
Albumin: 3.8 g/dL (ref 3.6–5.1)
Alkaline phosphatase (APISO): 43 U/L (ref 40–115)
BUN: 11 mg/dL (ref 7–25)
CO2: 30 mmol/L (ref 20–32)
Calcium: 8.7 mg/dL (ref 8.6–10.3)
Chloride: 100 mmol/L (ref 98–110)
Creat: 0.86 mg/dL (ref 0.70–1.18)
Globulin: 2.3 g/dL (ref 1.9–3.7)
Glucose, Bld: 119 mg/dL — ABNORMAL HIGH (ref 65–99)
Potassium: 4.2 mmol/L (ref 3.5–5.3)
Sodium: 134 mmol/L — ABNORMAL LOW (ref 135–146)
Total Bilirubin: 0.4 mg/dL (ref 0.2–1.2)
Total Protein: 6.1 g/dL (ref 6.1–8.1)

## 2018-02-12 LAB — CBC
HCT: 40.3 % (ref 38.5–50.0)
Hemoglobin: 13.5 g/dL (ref 13.2–17.1)
MCH: 29.2 pg (ref 27.0–33.0)
MCHC: 33.5 g/dL (ref 32.0–36.0)
MCV: 87 fL (ref 80.0–100.0)
MPV: 9.4 fL (ref 7.5–12.5)
Platelets: 288 10*3/uL (ref 140–400)
RBC: 4.63 10*6/uL (ref 4.20–5.80)
RDW: 12.6 % (ref 11.0–15.0)
WBC: 7.7 10*3/uL (ref 3.8–10.8)

## 2018-02-12 LAB — LIPID PANEL
Cholesterol: 112 mg/dL
HDL: 41 mg/dL
LDL Cholesterol (Calc): 51 mg/dL
Non-HDL Cholesterol (Calc): 71 mg/dL
Total CHOL/HDL Ratio: 2.7 (calc)
Triglycerides: 115 mg/dL

## 2018-02-22 ENCOUNTER — Ambulatory Visit (INDEPENDENT_AMBULATORY_CARE_PROVIDER_SITE_OTHER): Payer: Medicare HMO

## 2018-02-22 VITALS — BP 102/60 | HR 64 | Temp 98.1°F | Ht 69.5 in | Wt 181.8 lb

## 2018-02-22 DIAGNOSIS — Z0000 Encounter for general adult medical examination without abnormal findings: Secondary | ICD-10-CM | POA: Diagnosis not present

## 2018-02-22 NOTE — Progress Notes (Signed)
PCP notes:   Health maintenance:  Foot exam - PCP please address at next appt  Abnormal screenings:   Fall risk - hx of multiple falls Fall Risk  02/22/2018 11/30/2016 10/07/2015 10/29/2014 09/22/2013  Falls in the past year? Yes No No No No  Comment 1 fall - fainted; 1 fall - fell backwards off truck - - - -  Number falls in past yr: 2 or more - - - -  Injury with Fall? Yes - - - -  Comment bruising to left side of face - - - -  Risk Factor Category  High Fall Risk - - - -  Risk for fall due to : Impaired balance/gait;Impaired mobility - - - -   Depression score: 6 Depression screen [place] 2/9 02/22/2018 11/30/2016 10/07/2015 10/29/2014 09/22/2013  Decreased Interest 0 0 0 0 0  Down, Depressed, Hopeless 1 0 0 0 0  PHQ - 2 Score 1 0 0 0 0  Altered sleeping 3 - - - -  Tired, decreased energy 1 - - - -  Change in appetite 1 - - - -  Feeling bad or failure about yourself  0 - - - -  Trouble concentrating 0 - - - -  Moving slowly or fidgety/restless 0 - - - -  Suicidal thoughts 0 - - - -  PHQ-9 Score 6 - - - -  Difficult doing work/chores Not difficult at all - - - -   Patient concerns:   None  Nurse concerns:  None  Next PCP appt:   03/03/18 @ 1100

## 2018-02-22 NOTE — Patient Instructions (Signed)
Brian Mullen , Thank you for taking time to come for your Medicare Wellness Visit. I appreciate your ongoing commitment to your health goals. Please review the following plan we discussed and let me know if I can assist you in the future.   These are the goals we discussed: Goals    . LIFESTYLE - DECREASE FALLS RISK     Starting 02/22/2018, I will continue to use cane daily to reduce risk of falls.        This is a list of the screening recommended for you and due dates:  Health Maintenance  Topic Date Due  . Complete foot exam   03/03/2018*  . Colon Cancer Screening  11/30/2026*  . Eye exam for diabetics  03/16/2018  . Flu Shot  06/16/2018  . Hemoglobin A1C  08/14/2018  . Tetanus Vaccine  09/23/2023  .  Hepatitis C: One time screening is recommended by Center for Disease Control  (CDC) for  adults born from [birth year] through 1965.   Completed  . Pneumonia vaccines  Completed  *Topic was postponed. The date shown is not the original due date.   Preventive Care for Adults  A healthy lifestyle and preventive care can promote health and wellness. Preventive health guidelines for adults include the following key practices.  . A routine yearly physical is a good way to check with your health care provider about your health and preventive screening. It is a chance to share any concerns and updates on your health and to receive a thorough exam.  . Visit your dentist for a routine exam and preventive care every 6 months. Brush your teeth twice a day and floss once a day. Good oral hygiene prevents tooth decay and gum disease.  . The frequency of eye exams is based on your age, health, family medical history, use  of contact lenses, and other factors. Follow your health care provider's recommendations for frequency of eye exams.  . Eat a healthy diet. Foods like vegetables, fruits, whole grains, low-fat dairy products, and lean protein foods contain the nutrients you need without too many calories.  Decrease your intake of foods high in solid fats, added sugars, and salt. Eat the right amount of calories for you. Get information about a proper diet from your health care provider, if necessary.  . Regular physical exercise is one of the most important things you can do for your health. Most adults should get at least 150 minutes of moderate-intensity exercise (any activity that increases your heart rate and causes you to sweat) each week. In addition, most adults need muscle-strengthening exercises on 2 or more days a week.  Silver Sneakers may be a benefit available to you. To determine eligibility, you may visit the website: www.silversneakers.com or contact program at [phone] Mon-Fri between 8AM-8PM.   . Maintain a healthy weight. The body mass index (BMI) is a screening tool to identify possible weight problems. It provides an estimate of body fat based on height and weight. Your health care provider can find your BMI and can help you achieve or maintain a healthy weight.   For adults 20 years and older: ? A BMI below 18.5 is considered underweight. ? A BMI of 18.5 to 24.9 is normal. ? A BMI of 25 to 29.9 is considered overweight. ? A BMI of 30 and above is considered obese.   . Maintain normal blood lipids and cholesterol levels by exercising and minimizing your intake of saturated fat. Eat a balanced diet  with plenty of fruit and vegetables. Blood tests for lipids and cholesterol should begin at age 65 and be repeated every 5 years. If your lipid or cholesterol levels are high, you are over 50, or you are at high risk for heart disease, you may need your cholesterol levels checked more frequently. Ongoing high lipid and cholesterol levels should be treated with medicines if diet and exercise are not working.  . If you smoke, find out from your health care provider how to quit. If you do not use tobacco, please do not start.  . If you choose to drink alcohol, please do not consume  more than 2 drinks per day. One drink is considered to be 12 ounces (355 mL) of beer, 5 ounces (148 mL) of wine, or 1.5 ounces (44 mL) of liquor.  . If you are 101-35 years old, ask your health care provider if you should take aspirin to prevent strokes.  . Use sunscreen. Apply sunscreen liberally and repeatedly throughout the day. You should seek shade when your shadow is shorter than you. Protect yourself by wearing long sleeves, pants, a wide-brimmed hat, and sunglasses year round, whenever you are outdoors.  . Once a month, do a whole body skin exam, using a mirror to look at the skin on your back. Tell your health care provider of new moles, moles that have irregular borders, moles that are larger than a pencil eraser, or moles that have changed in shape or color.

## 2018-02-22 NOTE — Progress Notes (Signed)
Subjective:   Brian Mullen is a 71 y.o. male who presents for Medicare Annual/Subsequent preventive examination.  Review of Systems:  N/A Cardiac Risk Factors include: advanced age (>48mn, >>47women);diabetes mellitus;dyslipidemia;hypertension;male gender     Objective:    Vitals: BP 102/60 (BP Location: Right Arm, Patient Position: Sitting, Cuff Size: Normal)   Pulse 64   Temp 98.1 F (36.7 C) (Oral)   Ht 5' 9.5" (1.765 m) Comment: shoes  Wt 181 lb 12 oz (82.4 kg)   SpO2 95%   BMI 26.45 kg/m   Body mass index is 26.45 kg/m.  Advanced Directives 02/22/2018 09/15/2017 11/30/2016  Does Patient Have a Medical Advance Directive? Yes Yes No  Type of [place] -  Does patient want to make changes to medical advance directive? - No - Patient declined -  Copy of [place] Chart? No - copy requested No - copy requested -    Tobacco Social History   Tobacco Use  Smoking Status Former Smoker  Smokeless Tobacco Never Used     Counseling given: No   Clinical Intake:  Pre-visit preparation completed: Yes  Pain Score: 8      Nutritional Status: BMI 25 -29 Overweight Nutritional Risks: None Diabetes: Yes CBG done?: No Did pt. bring in CBG monitor from home?: No  How often do you need to have someone help you when you read instructions, pamphlets, or other written materials from your doctor or pharmacy?: 1 - Never What is the last grade level you completed in school?: 8th grade  Interpreter Needed?: No  Comments: pt is a widower and lives alone Information entered by :: LPinson,LPN  Past Medical History:  Diagnosis Date  . Allergy   . Asthma   . Diabetes mellitus without complication ([place]   . GERD (gastroesophageal reflux disease)   . Hypertension    Past Surgical History:  Procedure Laterality Date  . ELBOW SURGERY    . KNEE SURGERY Right   .  TONSILLECTOMY     Family History  Problem Relation Age of Onset  . Pulmonary embolism Father 425      Deceased  . Heart attack Mother 618      Deceased  . Diabetes Maternal Grandmother   . Heart attack Sister    Social History   Socioeconomic History  . Marital status: Married    Spouse name: Not on file  . Number of children: Not on file  . Years of education: Not on file  . Highest education level: Not on file  Occupational History  . Not on file  Social Needs  . Financial resource strain: Not on file  . Food insecurity:    Worry: Not on file    Inability: Not on file  . Transportation needs:    Medical: Not on file    Non-medical: Not on file  Tobacco Use  . Smoking status: Former SResearch scientist (life sciences) . Smokeless tobacco: Never Used  Substance and Sexual Activity  . Alcohol use: Yes    Alcohol/week: 0.6 - 1.2 oz    Types: 1 - 2 Standard drinks or equivalent per week    Comment: occ  . Drug use: No  . Sexual activity: Not on file  Lifestyle  . Physical activity:    Days per week: Not on file    Minutes per session: Not on file  . Stress: Not on file  Relationships  .  Social connections:    Talks on phone: Not on file    Gets together: Not on file    Attends religious service: Not on file    Active member of club or organization: Not on file    Attends meetings of clubs or organizations: Not on file    Relationship status: Not on file  Other Topics Concern  . Not on file  Social History Narrative   Married.   No children   Retried. Worked as a Furniture conservator/restorer.   Enjoys working on [employer], yard work, gardening.    Outpatient Encounter Medications as of 02/22/2018  Medication Sig  . albuterol (PROVENTIL HFA;VENTOLIN HFA) 108 (90 Base) MCG/ACT inhaler Inhale 2 puffs into the lungs every 6 (six) hours as needed for wheezing or shortness of breath.  Marland Kitchen aspirin 81 MG tablet Take 81 mg by mouth daily.  . Blood Glucose Monitoring Suppl (ONE TOUCH ULTRA 2)  w/Device KIT Use as instructed to blood sugar 2 times daily.  . fexofenadine (ALLEGRA) 180 MG tablet Take 180 mg by mouth daily.  Marland Kitchen glucose blood (ONE TOUCH ULTRA TEST) test strip Use as instructed to test blood sugar 2 times daily  . Lancets (ONETOUCH ULTRASOFT) lancets Use as instructed to test blood sugar 2 times daily.  Marland Kitchen losartan (COZAAR) 25 MG tablet Take 1 tablet (25 mg total) by mouth daily.  . metFORMIN (GLUCOPHAGE) 500 MG tablet TAKE 2 TABLETS (1,000 MG TOTAL) BY MOUTH 2 (TWO) TIMES DAILY WITH A MEAL.  . metoprolol succinate (TOPROL-XL) 25 MG 24 hr tablet Take 1 tablet by mouth daily for high blood pressure.  . montelukast (SINGULAIR) 10 MG tablet TAKE 1 TABLET (10 MG TOTAL) BY MOUTH AT BEDTIME.  . simvastatin (ZOCOR) 10 MG tablet TAKE 1 TABLET(10 MG) BY MOUTH AT BEDTIME  . tiotropium (SPIRIVA) 18 MCG inhalation capsule Place 1 capsule (18 mcg total) into inhaler and inhale daily.  Marland Kitchen tiZANidine (ZANAFLEX) 2 MG tablet TAKE 1 TABLET(2 MG) BY MOUTH AT BEDTIME AS NEEDED FOR MUSCLE SPASMS   No facility-administered encounter medications on file as of 02/22/2018.     Activities of Daily Living In your present state of health, do you have any difficulty performing the following activities: 02/22/2018  Hearing? Y  Vision? N  Difficulty concentrating or making decisions? N  Walking or climbing stairs? Y  Dressing or bathing? N  Doing errands, shopping? N  Preparing Food and eating ? N  Using the Toilet? N  In the past six months, have you accidently leaked urine? N  Do you have problems with loss of bowel control? N  Managing your Medications? N  Managing your Finances? N  Housekeeping or managing your Housekeeping? N  Some recent data might be hidden    Patient Care Team: Pleas Koch, NP as PCP - General (Nurse Practitioner)   Assessment:   This is a routine wellness examination for Brian Mullen.  Hearing Screening Comments: Bilateral hearing aids (does not wear them) Vision  Screening Comments: Last vision exam in May 2018   Exercise Activities and Dietary recommendations Current Exercise Habits: The patient does not participate in regular exercise at present, Exercise limited by: orthopedic condition(s)  Goals    . LIFESTYLE - DECREASE FALLS RISK     Starting 02/22/2018, I will continue to use cane daily to reduce risk of falls.        Fall Risk Fall Risk  02/22/2018 11/30/2016 10/07/2015 10/29/2014 09/22/2013  Falls in  the past year? Yes No No No No  Comment 1 fall - fainted; 1 fall - fell backwards off truck - - - -  Number falls in past yr: 2 or more - - - -  Injury with Fall? Yes - - - -  Comment bruising to left side of face - - - -  Risk Factor Category  High Fall Risk - - - -  Risk for fall due to : Impaired balance/gait;Impaired mobility - - - -   Depression Screen PHQ 2/9 Scores 02/22/2018 11/30/2016 10/07/2015 10/29/2014  PHQ - 2 Score 1 0 0 0  PHQ- 9 Score 6 - - -    Cognitive Function MMSE - Mini Mental State Exam 02/22/2018 11/30/2016  Orientation to time 5 5  Orientation to Place 5 5  Registration 3 3  Attention/ Calculation 0 0  Recall 3 3  Language- name 2 objects 0 0  Language- repeat 1 1  Language- follow 3 step command 3 3  Language- read & follow direction 0 0  Write a sentence 0 0  Copy design 0 0  Total score 20 20     PLEASE NOTE: A Mini-Cog screen was completed. Maximum score is 20. A value of 0 denotes this part of Folstein MMSE was not completed or the patient failed this part of the Mini-Cog screening.   Mini-Cog Screening Orientation to Time - Max 5 pts Orientation to Place - Max 5 pts Registration - Max 3 pts Recall - Max 3 pts Language Repeat - Max 1 pts Language Follow 3 Step Command - Max 3 pts     Immunization History  Administered Date(s) Administered  . Influenza,inj,Quad PF,6+ Mos 08/18/2013, 10/29/2014, 11/30/2016  . Pneumococcal Conjugate-13 09/22/2013  . Pneumococcal Polysaccharide-23 10/29/2014  .  Td 09/22/2013  . Zoster 12/14/2016    Screening Tests Health Maintenance  Topic Date Due  . FOOT EXAM  03/03/2018 (Originally 10/30/2015)  . COLONOSCOPY  11/30/2026 (Originally 12/29/1995)  . OPHTHALMOLOGY EXAM  03/16/2018  . INFLUENZA VACCINE  06/16/2018  . HEMOGLOBIN A1C  08/14/2018  . TETANUS/TDAP  09/23/2023  . Hepatitis C Screening  Completed  . PNA vac Low Risk Adult  Completed       Plan:     I have personally reviewed, addressed, and noted the following in the patient's chart:  A. Medical and social history B. Use of alcohol, tobacco or illicit drugs  C. Current medications and supplements D. Functional ability and status E.  Nutritional status F.  Physical activity G. Advance directives H. List of other physicians I.  Hospitalizations, surgeries, and ER visits in previous 12 months J.  [place] to include hearing, vision, cognitive, depression L. Referrals and appointments - none  In addition, I have reviewed and discussed with patient certain preventive protocols, quality metrics, and best practice recommendations. A written personalized care plan for preventive services as well as general preventive health recommendations were provided to patient.  See attached scanned questionnaire for additional information.   Signed,   Lindell Noe, MHA, BS, LPN Health Coach

## 2018-02-24 NOTE — Progress Notes (Signed)
I reviewed health advisor's note, was available for consultation, and agree with documentation and plan.  

## 2018-03-02 ENCOUNTER — Other Ambulatory Visit: Payer: Self-pay | Admitting: Primary Care

## 2018-03-03 ENCOUNTER — Encounter: Payer: Self-pay | Admitting: Primary Care

## 2018-03-03 ENCOUNTER — Ambulatory Visit (INDEPENDENT_AMBULATORY_CARE_PROVIDER_SITE_OTHER): Payer: Medicare HMO | Admitting: Primary Care

## 2018-03-03 VITALS — BP 110/66 | HR 74 | Temp 98.0°F | Ht 69.0 in | Wt 181.8 lb

## 2018-03-03 DIAGNOSIS — R55 Syncope and collapse: Secondary | ICD-10-CM

## 2018-03-03 DIAGNOSIS — I10 Essential (primary) hypertension: Secondary | ICD-10-CM | POA: Diagnosis not present

## 2018-03-03 DIAGNOSIS — E119 Type 2 diabetes mellitus without complications: Secondary | ICD-10-CM

## 2018-03-03 DIAGNOSIS — Z0000 Encounter for general adult medical examination without abnormal findings: Secondary | ICD-10-CM

## 2018-03-03 DIAGNOSIS — E7800 Pure hypercholesterolemia, unspecified: Secondary | ICD-10-CM

## 2018-03-03 DIAGNOSIS — J4540 Moderate persistent asthma, uncomplicated: Secondary | ICD-10-CM

## 2018-03-03 NOTE — Assessment & Plan Note (Signed)
A1C of 6.5 today.  Glucose readings running 120-130's Pneumonia vaccination UTD.  Managed on statin and ARB. Foot exam today.  Continue Metformin. Follow up in 6 months.

## 2018-03-03 NOTE — Assessment & Plan Note (Signed)
Stable in the office today, continue losartan and metoprolol. BMP unremarkable.

## 2018-03-03 NOTE — Assessment & Plan Note (Signed)
Recent lipid panel stable on Simvastatin, continue same.

## 2018-03-03 NOTE — Assessment & Plan Note (Addendum)
Using Spiriva daily during seasonal changes, using albuterol PRN. Spring and Fall months mostly. Continue same.  Continue Singulair and Allegra daily.

## 2018-03-03 NOTE — Patient Instructions (Addendum)
Complete the cologuard kit as discussed.  Start exercising. You should be getting 150 minutes of exercise weekly.  Increase vegetables, fruit, whole grains, lean protein.  Ensure you are consuming 64 ounces of water daily.  Please schedule a follow up appointment in 6 months for diabetes check.   It was a pleasure to see you today!

## 2018-03-03 NOTE — Progress Notes (Signed)
Subjective:    Patient ID: Brian Mullen, male    DOB: Aug 01, 1946, 72 y.o.   MRN: [id number]  HPI  Brian Mullen is a 72 year old male who presents today for complete physical.  Immunizations: -Tetanus: Completed in 2014 -Influenza: Completed last season -Pneumonia: Completed Prevnar in 2014, Pneumovax in 2015 -Shingles: Completed Zoster in 2018  Diet: He endorses a fair diet Breakfast: Eggs with cheese, hashbrowns Lunch: Grilled cheese sandwich, left overs Dinner: Mac and cheese, chicken, restaurants Snacks: Dark chocolate, cheese crackers, chips Desserts: Chocolate Beverages: Hot tea, diet soda, Gatorade, no water  Exercise: He is not exercising Eye exam: Due in May 2019 Dental exam: No recent exam.  Colonoscopy: Never completed, opts for Cologuard PSA: Completed in 2016 Hep C Screen: Completed in 2018   Review of Systems  Constitutional: Negative for unexpected weight change.  HENT: Negative for rhinorrhea.   Respiratory: Negative for cough and shortness of breath.   Cardiovascular: Negative for chest pain.  Gastrointestinal: Negative for constipation and diarrhea.  Genitourinary: Negative for difficulty urinating.  Musculoskeletal: Positive for myalgias. Negative for arthralgias.       Myalgias have improved with regular exercise. He has not taken Tizanidine   Skin: Negative for rash.  Allergic/Immunologic: Negative for environmental allergies.  Neurological: Negative for dizziness, numbness and headaches.       Past Medical History:  Diagnosis Date  . Allergy   . Asthma   . Diabetes mellitus without complication ([place])   . GERD (gastroesophageal reflux disease)   . Hypertension      Social History   Socioeconomic History  . Marital status: Married    Spouse name: Not on file  . Number of children: Not on file  . Years of education: Not on file  . Highest education level: Not on file  Occupational History  . Not on file  Social Needs  . Financial  resource strain: Not on file  . Food insecurity:    Worry: Not on file    Inability: Not on file  . Transportation needs:    Medical: Not on file    Non-medical: Not on file  Tobacco Use  . Smoking status: Former Research scientist (life sciences)  . Smokeless tobacco: Never Used  Substance and Sexual Activity  . Alcohol use: Yes    Alcohol/week: 0.6 - 1.2 oz    Types: 1 - 2 Standard drinks or equivalent per week    Comment: occ  . Drug use: No  . Sexual activity: Not on file  Lifestyle  . Physical activity:    Days per week: Not on file    Minutes per session: Not on file  . Stress: Not on file  Relationships  . Social connections:    Talks on phone: Not on file    Gets together: Not on file    Attends religious service: Not on file    Active member of club or organization: Not on file    Attends meetings of clubs or organizations: Not on file    Relationship status: Not on file  . Intimate partner violence:    Fear of current or ex partner: Not on file    Emotionally abused: Not on file    Physically abused: Not on file    Forced sexual activity: Not on file  Other Topics Concern  . Not on file  Social History Narrative   Married.   No children   Retried. Worked as a Furniture conservator/restorer.  Enjoys working on [employer], yard work, gardening.    Past Surgical History:  Procedure Laterality Date  . ELBOW SURGERY    . KNEE SURGERY Right   . TONSILLECTOMY      Family History  Problem Relation Age of Onset  . Pulmonary embolism Father 22       Deceased  . Heart attack Mother 59       Deceased  . Diabetes Maternal Grandmother   . Heart attack Sister     Allergies  Allergen Reactions  . Coconut Oil Anaphylaxis  . Iodine Anaphylaxis  . Shellfish Allergy Anaphylaxis  . Shellfish-Derived Products Anaphylaxis    Current Outpatient Medications on File Prior to Visit  Medication Sig Dispense Refill  . albuterol (PROVENTIL HFA;VENTOLIN HFA) 108 (90 Base) MCG/ACT inhaler Inhale 2  puffs into the lungs every 6 (six) hours as needed for wheezing or shortness of breath. 1 Inhaler 3  . aspirin 81 MG tablet Take 81 mg by mouth daily.    . Blood Glucose Monitoring Suppl (ONE TOUCH ULTRA 2) w/Device KIT Use as instructed to blood sugar 2 times daily. 1 each 0  . fexofenadine (ALLEGRA) 180 MG tablet Take 180 mg by mouth daily.    Marland Kitchen glucose blood (ONE TOUCH ULTRA TEST) test strip Use as instructed to test blood sugar 2 times daily 100 each 5  . Lancets (ONETOUCH ULTRASOFT) lancets Use as instructed to test blood sugar 2 times daily. 100 each 5  . losartan (COZAAR) 25 MG tablet Take 1 tablet (25 mg total) by mouth daily. 90 tablet 0  . metFORMIN (GLUCOPHAGE) 500 MG tablet TAKE 2 TABLETS (1,000 MG TOTAL) BY MOUTH 2 (TWO) TIMES DAILY WITH A MEAL. 120 tablet 5  . metoprolol succinate (TOPROL-XL) 25 MG 24 hr tablet Take 1 tablet by mouth daily for high blood pressure. 90 tablet 0  . montelukast (SINGULAIR) 10 MG tablet TAKE 1 TABLET (10 MG TOTAL) BY MOUTH AT BEDTIME. 90 tablet 1  . simvastatin (ZOCOR) 10 MG tablet TAKE 1 TABLET(10 MG) BY MOUTH AT BEDTIME 90 tablet 1  . tiotropium (SPIRIVA) 18 MCG inhalation capsule Place 1 capsule (18 mcg total) into inhaler and inhale daily. 30 capsule 3   No current facility-administered medications on file prior to visit.     BP 110/66   Pulse 74   Temp 98 F (36.7 C) (Oral)   Ht _0  (1.753 m)   Wt 181 lb 12 oz (82.4 kg)   SpO2 97%   BMI 26.84 kg/m    Objective:   Physical Exam  Constitutional: He is oriented to person, place, and time. He appears well-nourished.  HENT:  Right Ear: Tympanic membrane and ear canal normal.  Left Ear: Tympanic membrane and ear canal normal.  Nose: Nose normal. Right sinus exhibits no maxillary sinus tenderness and no frontal sinus tenderness. Left sinus exhibits no maxillary sinus tenderness and no frontal sinus tenderness.  Mouth/Throat: Oropharynx is clear and moist.  Eyes: Pupils are equal, round,  and reactive to light. Conjunctivae and EOM are normal.  Neck: Neck supple. Carotid bruit is not present. No thyromegaly present.  Cardiovascular: Normal rate, regular rhythm and normal heart sounds.  Pulmonary/Chest: Effort normal and breath sounds normal. He has no wheezes. He has no rales.  Abdominal: Soft. Bowel sounds are normal. There is no tenderness.  Musculoskeletal: Normal range of motion.  Neurological: He is alert and oriented to person, place, and time. He has normal reflexes. No cranial  nerve deficit.  Skin: Skin is warm and dry.  Psychiatric: He has a normal mood and affect.          Assessment & Plan:

## 2018-03-04 NOTE — Assessment & Plan Note (Signed)
Likely secondary to Tizanidine use. He denies further feelings of dizziness, near syncope. Continue off Tizanidine.

## 2018-03-04 NOTE — Assessment & Plan Note (Signed)
Immunizations UTD. Colon cancer screening overdue, he opts for cologuard.  Patient signed up for Cologuard.  PSA due next year.  Recommended to work on diet, increase water and regular exercise. Exam unremarkable. Labs stable. Follow up in 1 year for CPE.

## 2018-03-07 ENCOUNTER — Other Ambulatory Visit: Payer: Self-pay | Admitting: Primary Care

## 2018-03-21 ENCOUNTER — Other Ambulatory Visit: Payer: Self-pay | Admitting: Primary Care

## 2018-03-21 DIAGNOSIS — E785 Hyperlipidemia, unspecified: Secondary | ICD-10-CM

## 2018-04-04 DIAGNOSIS — M546 Pain in thoracic spine: Secondary | ICD-10-CM | POA: Diagnosis not present

## 2018-04-04 DIAGNOSIS — M542 Cervicalgia: Secondary | ICD-10-CM | POA: Diagnosis not present

## 2018-04-04 DIAGNOSIS — M9902 Segmental and somatic dysfunction of thoracic region: Secondary | ICD-10-CM | POA: Diagnosis not present

## 2018-04-04 DIAGNOSIS — M9901 Segmental and somatic dysfunction of cervical region: Secondary | ICD-10-CM | POA: Diagnosis not present

## 2018-04-04 DIAGNOSIS — M545 Low back pain: Secondary | ICD-10-CM | POA: Diagnosis not present

## 2018-04-04 DIAGNOSIS — M9903 Segmental and somatic dysfunction of lumbar region: Secondary | ICD-10-CM | POA: Diagnosis not present

## 2018-05-02 DIAGNOSIS — M9901 Segmental and somatic dysfunction of cervical region: Secondary | ICD-10-CM | POA: Diagnosis not present

## 2018-05-02 DIAGNOSIS — M9906 Segmental and somatic dysfunction of lower extremity: Secondary | ICD-10-CM | POA: Diagnosis not present

## 2018-05-02 DIAGNOSIS — M542 Cervicalgia: Secondary | ICD-10-CM | POA: Diagnosis not present

## 2018-05-02 DIAGNOSIS — M9903 Segmental and somatic dysfunction of lumbar region: Secondary | ICD-10-CM | POA: Diagnosis not present

## 2018-05-02 DIAGNOSIS — M545 Low back pain: Secondary | ICD-10-CM | POA: Diagnosis not present

## 2018-05-02 DIAGNOSIS — M9902 Segmental and somatic dysfunction of thoracic region: Secondary | ICD-10-CM | POA: Diagnosis not present

## 2018-05-02 DIAGNOSIS — M546 Pain in thoracic spine: Secondary | ICD-10-CM | POA: Diagnosis not present

## 2018-05-05 ENCOUNTER — Other Ambulatory Visit: Payer: Self-pay | Admitting: Primary Care

## 2018-05-05 DIAGNOSIS — I10 Essential (primary) hypertension: Secondary | ICD-10-CM

## 2018-05-20 ENCOUNTER — Other Ambulatory Visit: Payer: Self-pay | Admitting: Primary Care

## 2018-06-04 ENCOUNTER — Other Ambulatory Visit: Payer: Self-pay | Admitting: Primary Care

## 2018-06-09 DIAGNOSIS — M9903 Segmental and somatic dysfunction of lumbar region: Secondary | ICD-10-CM | POA: Diagnosis not present

## 2018-06-09 DIAGNOSIS — M542 Cervicalgia: Secondary | ICD-10-CM | POA: Diagnosis not present

## 2018-06-09 DIAGNOSIS — M9901 Segmental and somatic dysfunction of cervical region: Secondary | ICD-10-CM | POA: Diagnosis not present

## 2018-06-09 DIAGNOSIS — M545 Low back pain: Secondary | ICD-10-CM | POA: Diagnosis not present

## 2018-06-09 DIAGNOSIS — M546 Pain in thoracic spine: Secondary | ICD-10-CM | POA: Diagnosis not present

## 2018-06-09 DIAGNOSIS — M9902 Segmental and somatic dysfunction of thoracic region: Secondary | ICD-10-CM | POA: Diagnosis not present

## 2018-06-14 DIAGNOSIS — M542 Cervicalgia: Secondary | ICD-10-CM | POA: Diagnosis not present

## 2018-06-14 DIAGNOSIS — M546 Pain in thoracic spine: Secondary | ICD-10-CM | POA: Diagnosis not present

## 2018-06-14 DIAGNOSIS — M545 Low back pain: Secondary | ICD-10-CM | POA: Diagnosis not present

## 2018-06-14 DIAGNOSIS — M9902 Segmental and somatic dysfunction of thoracic region: Secondary | ICD-10-CM | POA: Diagnosis not present

## 2018-06-14 DIAGNOSIS — M9901 Segmental and somatic dysfunction of cervical region: Secondary | ICD-10-CM | POA: Diagnosis not present

## 2018-06-14 DIAGNOSIS — M9903 Segmental and somatic dysfunction of lumbar region: Secondary | ICD-10-CM | POA: Diagnosis not present

## 2018-06-21 DIAGNOSIS — M9902 Segmental and somatic dysfunction of thoracic region: Secondary | ICD-10-CM | POA: Diagnosis not present

## 2018-06-21 DIAGNOSIS — M9906 Segmental and somatic dysfunction of lower extremity: Secondary | ICD-10-CM | POA: Diagnosis not present

## 2018-06-21 DIAGNOSIS — M9903 Segmental and somatic dysfunction of lumbar region: Secondary | ICD-10-CM | POA: Diagnosis not present

## 2018-06-21 DIAGNOSIS — M546 Pain in thoracic spine: Secondary | ICD-10-CM | POA: Diagnosis not present

## 2018-06-21 DIAGNOSIS — M542 Cervicalgia: Secondary | ICD-10-CM | POA: Diagnosis not present

## 2018-06-21 DIAGNOSIS — M9901 Segmental and somatic dysfunction of cervical region: Secondary | ICD-10-CM | POA: Diagnosis not present

## 2018-06-21 DIAGNOSIS — M545 Low back pain: Secondary | ICD-10-CM | POA: Diagnosis not present

## 2018-07-13 ENCOUNTER — Ambulatory Visit (INDEPENDENT_AMBULATORY_CARE_PROVIDER_SITE_OTHER): Payer: Medicare HMO | Admitting: Primary Care

## 2018-07-13 ENCOUNTER — Encounter: Payer: Self-pay | Admitting: Primary Care

## 2018-07-13 VITALS — BP 118/68 | HR 66 | Temp 98.0°F | Ht 69.0 in | Wt 180.2 lb

## 2018-07-13 DIAGNOSIS — J4540 Moderate persistent asthma, uncomplicated: Secondary | ICD-10-CM | POA: Diagnosis not present

## 2018-07-13 DIAGNOSIS — E349 Endocrine disorder, unspecified: Secondary | ICD-10-CM | POA: Diagnosis not present

## 2018-07-13 DIAGNOSIS — I10 Essential (primary) hypertension: Secondary | ICD-10-CM

## 2018-07-13 DIAGNOSIS — E119 Type 2 diabetes mellitus without complications: Secondary | ICD-10-CM | POA: Diagnosis not present

## 2018-07-13 DIAGNOSIS — R5383 Other fatigue: Secondary | ICD-10-CM | POA: Diagnosis not present

## 2018-07-13 DIAGNOSIS — N529 Male erectile dysfunction, unspecified: Secondary | ICD-10-CM

## 2018-07-13 MED ORDER — SILDENAFIL CITRATE 20 MG PO TABS: ORAL_TABLET | ORAL | 0 refills | Status: DC

## 2018-07-13 NOTE — Progress Notes (Signed)
Subjective:    Patient ID: Brian Mullen, male    DOB: 1946-04-22, 72 y.o.   MRN: [id number]  HPI  Brian Mullen is a 72 year old male who presents today regarding medication management and follow up.  1) Type 2 Diabetes:  Current medications include: Metformin 1000 mg BID.   He is checking his blood glucose 1-2 times daily and when he "doesn't feel right" and is getting readings of: 90-160  Highest reading: low 200's Lowest reading: 85  Last A1C: 6.5 in March 2019 Last Eye Exam: Over 1 year ago.  Last Foot Exam: Due today Pneumonia Vaccination: Completed in 2015, due in 2020 ACE/ARB: Losartan Statin: Simvastatin   2) Essential Hypertension: Currently managed on losartan 25 mg and metoprolol succinate 25 mg. He does not check his BP at home. He does experience dizziness when changing positions from bending forward to standing upright.   BP Readings from Last 3 Encounters:  07/13/18 118/68  03/03/18 110/66  02/22/18 102/60   3) Asthma/Seasonal Allergies: Currently managed on Spiriva and albuterol inhalers, using sparingly and mostly during seasonal changes. Using Singulair and Allegera most days of the week when he remembers.   4) Erectile Dysfunction: Currently engaged to be married. He would like to discuss treatment. Difficulty obtaining and maintaining an erection. Previously took Viagra several years ago with improvement. He has a history of testosterone deficiency with his last testosterone level being in the low 300's, prior reading was in the double digits. He is currently following with [facility] in [facility] who prescribed him with a topical testosterone/DHEA cream for which he applies dailhy. Overall he's not feeling much different after treatment with the cream, is now napping once daily in the afternoon.    Review of Systems  Constitutional: Positive for fatigue.  Eyes: Negative for visual disturbance.  Respiratory: Negative for shortness of breath and  wheezing.   Cardiovascular: Negative for chest pain.  Genitourinary:       Erectile dysfunction. Low testosterone.  Neurological: Negative for numbness.       Past Medical History:  Diagnosis Date  . Allergy   . Asthma   . Diabetes mellitus without complication ([place])   . GERD (gastroesophageal reflux disease)   . Hypertension      Social History   Socioeconomic History  . Marital status: Married    Spouse name: Not on file  . Number of children: Not on file  . Years of education: Not on file  . Highest education level: Not on file  Occupational History  . Not on file  Social Needs  . Financial resource strain: Not on file  . Food insecurity:    Worry: Not on file    Inability: Not on file  . Transportation needs:    Medical: Not on file    Non-medical: Not on file  Tobacco Use  . Smoking status: Former Research scientist (life sciences)  . Smokeless tobacco: Never Used  Substance and Sexual Activity  . Alcohol use: Yes    Alcohol/week: 1.0 - 2.0 standard drinks    Types: 1 - 2 Standard drinks or equivalent per week    Comment: occ  . Drug use: No  . Sexual activity: Not on file  Lifestyle  . Physical activity:    Days per week: Not on file    Minutes per session: Not on file  . Stress: Not on file  Relationships  . Social connections:    Talks on phone: Not on file  Gets together: Not on file    Attends religious service: Not on file    Active member of club or organization: Not on file    Attends meetings of clubs or organizations: Not on file    Relationship status: Not on file  . Intimate partner violence:    Fear of current or ex partner: Not on file    Emotionally abused: Not on file    Physically abused: Not on file    Forced sexual activity: Not on file  Other Topics Concern  . Not on file  Social History Narrative   Married.   No children   Retried. Worked as a Furniture conservator/restorer.   Enjoys working on [employer], yard work, gardening.    Past Surgical  History:  Procedure Laterality Date  . ELBOW SURGERY    . KNEE SURGERY Right   . TONSILLECTOMY      Family History  Problem Relation Age of Onset  . Pulmonary embolism Father 72       Deceased  . Heart attack Mother 7       Deceased  . Diabetes Maternal Grandmother   . Heart attack Sister     Allergies  Allergen Reactions  . Coconut Oil Anaphylaxis  . Iodine Anaphylaxis  . Shellfish Allergy Anaphylaxis  . Shellfish-Derived Products Anaphylaxis    Current Outpatient Medications on File Prior to Visit  Medication Sig Dispense Refill  . ACCU-CHEK AVIVA PLUS test strip USE AS INSTRUCTED TO TEST BLOOD SUGAR 2 TIMES DAILY 100 each 1  . albuterol (PROVENTIL HFA;VENTOLIN HFA) 108 (90 Base) MCG/ACT inhaler Inhale 2 puffs into the lungs every 6 (six) hours as needed for wheezing or shortness of breath. 1 Inhaler 3  . aspirin 81 MG tablet Take 81 mg by mouth daily.    . Blood Glucose Monitoring Suppl (ONE TOUCH ULTRA 2) w/Device KIT Use as instructed to blood sugar 2 times daily. 1 each 0  . fexofenadine (ALLEGRA) 180 MG tablet Take 180 mg by mouth daily.    . Lancets (ONETOUCH ULTRASOFT) lancets Use as instructed to test blood sugar 2 times daily. 100 each 5  . losartan (COZAAR) 25 MG tablet TAKE 1 TABLET (25 MG TOTAL) BY MOUTH DAILY. NEED APPOINTMENT FOR ANY MORE REFILLS 90 tablet 2  . metFORMIN (GLUCOPHAGE) 500 MG tablet TAKE 2 TABLETS (1,000 MG TOTAL) BY MOUTH 2 (TWO) TIMES DAILY WITH A MEAL. 360 tablet 1  . metoprolol succinate (TOPROL-XL) 25 MG 24 hr tablet TAKE 1 TABLET BY MOUTH DAILY FOR HIGH BLOOD PRESSURE. 90 tablet 0  . montelukast (SINGULAIR) 10 MG tablet TAKE 1 TABLET (10 MG TOTAL) BY MOUTH AT BEDTIME. 90 tablet 1  . simvastatin (ZOCOR) 10 MG tablet TAKE 1 TABLET(10 MG) BY MOUTH AT BEDTIME 90 tablet 1  . TESTOSTERONE COMPOUNDING KIT TD Place onto the skin. MEDICATION: TESTOSTERONE/DHEA (ATREVIS GEL) (MC) 100 MG/75 MG PER ML GEL DIRECTION: APPLY 5 CLICKS (5.28 ML) PER DAY AS  DIRECTED.    Marland Kitchen tiotropium (SPIRIVA) 18 MCG inhalation capsule Place 1 capsule (18 mcg total) into inhaler and inhale daily. 30 capsule 3   No current facility-administered medications on file prior to visit.     BP 118/68   Pulse 66   Temp 98 F (36.7 C) (Oral)   Ht 5' 9"  (1.753 m)   Wt 180 lb 4 oz (81.8 kg)   SpO2 98%   BMI 26.62 kg/m    Objective:   Physical Exam  Constitutional: He appears well-nourished.  Neck: Neck supple.  Cardiovascular: Normal rate and regular rhythm.  Respiratory: Effort normal and breath sounds normal.  Skin: Skin is warm and dry.  Psychiatric: He has a normal mood and affect.           Assessment & Plan:

## 2018-07-13 NOTE — Patient Instructions (Addendum)
Stop by the lab prior to leaving today. I will notify you of your results once received.   You may take the sildenafil 20 mg tablets as needed for erectile dysfunction. Take 2-5 tablets by mouth 30 minutes prior to intercourse.  Please send me a picture of the testosterone cream as discussed.  Cancel the October appointment, we will see you in April as scheduled unless your labs are grossly abnormal.   It was a pleasure to see you today!

## 2018-07-14 DIAGNOSIS — M546 Pain in thoracic spine: Secondary | ICD-10-CM | POA: Diagnosis not present

## 2018-07-14 DIAGNOSIS — M542 Cervicalgia: Secondary | ICD-10-CM | POA: Diagnosis not present

## 2018-07-14 DIAGNOSIS — M9902 Segmental and somatic dysfunction of thoracic region: Secondary | ICD-10-CM | POA: Diagnosis not present

## 2018-07-14 DIAGNOSIS — M9901 Segmental and somatic dysfunction of cervical region: Secondary | ICD-10-CM | POA: Diagnosis not present

## 2018-07-14 DIAGNOSIS — M9903 Segmental and somatic dysfunction of lumbar region: Secondary | ICD-10-CM | POA: Diagnosis not present

## 2018-07-14 DIAGNOSIS — M545 Low back pain: Secondary | ICD-10-CM | POA: Diagnosis not present

## 2018-07-14 LAB — CBC
HCT: 43.8 % (ref 39.0–52.0)
Hemoglobin: 14.6 g/dL (ref 13.0–17.0)
MCHC: 33.3 g/dL (ref 30.0–36.0)
MCV: 90.1 fl (ref 78.0–100.0)
Platelets: 324 10*3/uL (ref 150.0–400.0)
RBC: 4.86 Mil/uL (ref 4.22–5.81)
RDW: 13.7 % (ref 11.5–15.5)
WBC: 9.7 10*3/uL (ref 4.0–10.5)

## 2018-07-14 LAB — BASIC METABOLIC PANEL
BUN: 12 mg/dL (ref 6–23)
CO2: 32 mEq/L (ref 19–32)
Calcium: 9.1 mg/dL (ref 8.4–10.5)
Chloride: 100 mEq/L (ref 96–112)
Creatinine, Ser: 1 mg/dL (ref 0.40–1.50)
GFR: 77.95 mL/min (ref >60.00)
Glucose, Bld: 87 mg/dL (ref 70–99)
Potassium: 4.4 mEq/L (ref 3.5–5.1)
Sodium: 138 mEq/L (ref 135–145)

## 2018-07-14 LAB — VITAMIN B12: Vitamin B-12: 449 pg/mL (ref 211–911)

## 2018-07-14 LAB — TSH: TSH: 1.8 u[IU]/mL (ref 0.35–4.50)

## 2018-07-14 LAB — HEMOGLOBIN A1C: Hgb A1c MFr Bld: 6.7 % — ABNORMAL HIGH (ref 4.6–6.5)

## 2018-07-14 NOTE — Telephone Encounter (Signed)
Johny DrillingChan, can you add this into our system somehow? I couldn't find the exact one. Thanks.

## 2018-07-15 DIAGNOSIS — E349 Endocrine disorder, unspecified: Secondary | ICD-10-CM | POA: Insufficient documentation

## 2018-07-15 NOTE — Assessment & Plan Note (Signed)
Stable in the office today, continue current regimen. Recommended to changes positions slowly.

## 2018-07-15 NOTE — Telephone Encounter (Signed)
Done in current medication list

## 2018-07-15 NOTE — Assessment & Plan Note (Signed)
Recent A1C with increase to 6.7 which is still stable. Continue to monitor closely. Repeat in 6 months.   Foot exam completed. He will schedule an eye exam. Managed on ARB and statin.  Follow up in 6 months.

## 2018-07-15 NOTE — Assessment & Plan Note (Signed)
Infrequent use of inhalers, only during seasonal changes. Continue to monitor. Exam without wheezing.

## 2018-07-15 NOTE — Assessment & Plan Note (Signed)
Currently following with [facility] and using topical testosterone. Continue same.   Erectile dysfunction likely secondary. Rx for sildenafil 20 mg tablets sent to pharmacy with directions and precautions for use.

## 2018-07-19 DIAGNOSIS — M9901 Segmental and somatic dysfunction of cervical region: Secondary | ICD-10-CM | POA: Diagnosis not present

## 2018-07-19 DIAGNOSIS — M546 Pain in thoracic spine: Secondary | ICD-10-CM | POA: Diagnosis not present

## 2018-07-19 DIAGNOSIS — M9902 Segmental and somatic dysfunction of thoracic region: Secondary | ICD-10-CM | POA: Diagnosis not present

## 2018-07-19 DIAGNOSIS — M9903 Segmental and somatic dysfunction of lumbar region: Secondary | ICD-10-CM | POA: Diagnosis not present

## 2018-07-19 DIAGNOSIS — M545 Low back pain: Secondary | ICD-10-CM | POA: Diagnosis not present

## 2018-07-19 DIAGNOSIS — M542 Cervicalgia: Secondary | ICD-10-CM | POA: Diagnosis not present

## 2018-07-20 ENCOUNTER — Telehealth: Payer: Self-pay | Admitting: Primary Care

## 2018-07-20 NOTE — Telephone Encounter (Signed)
Copied from CRM [id number]. Topic: Quick Communication - Rx Refill/Question >> Jul 20, 2018 11:29 AM Baldo Daub L wrote: Medication: sildenafil (REVATIO) 20 MG tablet  Pt states that pharmacy told him that he can't get this prescription until our office notifies the insurance company of the need for this medication. Pt can be reached at [phone]

## 2018-07-21 NOTE — Telephone Encounter (Signed)
Prior Berkley Harvey has been done for patient regarding Rx

## 2018-07-25 NOTE — Telephone Encounter (Signed)
Prior auth for this medication was denied.

## 2018-08-04 ENCOUNTER — Other Ambulatory Visit: Payer: Self-pay | Admitting: Primary Care

## 2018-08-04 DIAGNOSIS — I10 Essential (primary) hypertension: Secondary | ICD-10-CM

## 2018-09-05 ENCOUNTER — Other Ambulatory Visit: Payer: Self-pay | Admitting: Primary Care

## 2018-09-05 ENCOUNTER — Ambulatory Visit: Payer: Medicare HMO | Admitting: Primary Care

## 2018-09-30 ENCOUNTER — Encounter: Payer: Self-pay | Admitting: Primary Care

## 2018-09-30 DIAGNOSIS — Z1211 Encounter for screening for malignant neoplasm of colon: Secondary | ICD-10-CM | POA: Diagnosis not present

## 2018-09-30 DIAGNOSIS — Z1212 Encounter for screening for malignant neoplasm of rectum: Secondary | ICD-10-CM | POA: Diagnosis not present

## 2018-10-06 ENCOUNTER — Telehealth: Payer: Self-pay | Admitting: *Deleted

## 2018-10-06 ENCOUNTER — Other Ambulatory Visit: Payer: Self-pay | Admitting: Primary Care

## 2018-10-06 DIAGNOSIS — E785 Hyperlipidemia, unspecified: Secondary | ICD-10-CM

## 2018-10-06 NOTE — Telephone Encounter (Signed)
Per DPR, left detail message that Rx is ready for pick up. Left in the front office.

## 2018-10-06 NOTE — Telephone Encounter (Signed)
Pt called stating he just missed a call from our office.

## 2018-10-06 NOTE — Telephone Encounter (Signed)
Noted, prescription written and placed in Chan's in box.

## 2018-10-06 NOTE — Telephone Encounter (Signed)
Spoke to pt who has broken the mouthpiece to his nebulizer machine and is needing a Rx for another [facility].

## 2018-10-11 DIAGNOSIS — M542 Cervicalgia: Secondary | ICD-10-CM | POA: Diagnosis not present

## 2018-10-11 DIAGNOSIS — M546 Pain in thoracic spine: Secondary | ICD-10-CM | POA: Diagnosis not present

## 2018-10-11 DIAGNOSIS — M9901 Segmental and somatic dysfunction of cervical region: Secondary | ICD-10-CM | POA: Diagnosis not present

## 2018-10-11 DIAGNOSIS — M9903 Segmental and somatic dysfunction of lumbar region: Secondary | ICD-10-CM | POA: Diagnosis not present

## 2018-10-11 DIAGNOSIS — M9902 Segmental and somatic dysfunction of thoracic region: Secondary | ICD-10-CM | POA: Diagnosis not present

## 2018-10-11 DIAGNOSIS — M545 Low back pain: Secondary | ICD-10-CM | POA: Diagnosis not present

## 2018-10-12 LAB — COLOGUARD: Cologuard: NEGATIVE

## 2018-10-17 ENCOUNTER — Encounter: Payer: Self-pay | Admitting: *Deleted

## 2018-10-17 ENCOUNTER — Ambulatory Visit: Payer: Medicare HMO | Admitting: Family Medicine

## 2018-10-17 DIAGNOSIS — Z0289 Encounter for other administrative examinations: Secondary | ICD-10-CM

## 2018-10-18 ENCOUNTER — Ambulatory Visit (INDEPENDENT_AMBULATORY_CARE_PROVIDER_SITE_OTHER): Payer: Medicare HMO | Admitting: Family Medicine

## 2018-10-18 DIAGNOSIS — M25512 Pain in left shoulder: Secondary | ICD-10-CM

## 2018-10-18 DIAGNOSIS — H5203 Hypermetropia, bilateral: Secondary | ICD-10-CM | POA: Diagnosis not present

## 2018-10-18 DIAGNOSIS — H52209 Unspecified astigmatism, unspecified eye: Secondary | ICD-10-CM | POA: Diagnosis not present

## 2018-10-18 DIAGNOSIS — H524 Presbyopia: Secondary | ICD-10-CM | POA: Diagnosis not present

## 2018-10-18 MED ORDER — PREDNISONE 5 MG PO TABS: ORAL_TABLET | ORAL | 0 refills | Status: DC

## 2018-10-18 NOTE — Progress Notes (Signed)
Brian Mullen Reinhardt - 72 y.o. male MRN [id number]  Date of birth: [birth year]  SUBJECTIVE:  Including CC & ROS.  Chief Complaint  Patient presents with  . Shoulder Pain    left/ started a couple of weeks ago    Brian Mullen Brian Mullen is a 72 y.o. male that is presenting with left shoulder pain.  This pain is acute on chronic in nature.  He reports receiving an injection a couple years ago and that has lasted him until now.  Pain with abduction and lying on the affected side. Pain is intermittent. Can range from mild to severe. No inciting event. He is retired.     Review of Systems  Constitutional: Negative for fever.  HENT: Negative for congestion.   Respiratory: Negative for cough.   Cardiovascular: Negative for chest pain.  Gastrointestinal: Negative for abdominal distention.  Musculoskeletal: Positive for arthralgias.  Skin: Negative for color change.  Neurological: Negative for weakness.  Hematological: Negative for adenopathy.  Psychiatric/Behavioral: Negative for agitation.    HISTORY: Past Medical, Surgical, Social, and Family History Reviewed & Updated per EMR.   Pertinent Historical Findings include:  Past Medical History:  Diagnosis Date  . Allergy   . Asthma   . Diabetes mellitus without complication ([facility])   . GERD (gastroesophageal reflux disease)   . Hypertension     Past Surgical History:  Procedure Laterality Date  . ELBOW SURGERY    . KNEE SURGERY Right   . TONSILLECTOMY      Allergies  Allergen Reactions  . Coconut Oil Anaphylaxis  . Iodine Anaphylaxis  . Shellfish Allergy Anaphylaxis  . Shellfish-Derived Products Anaphylaxis    Family History  Problem Relation Age of Onset  . Pulmonary embolism Father [id number]       Deceased  . Heart attack Mother [id number]       Deceased  . Diabetes Maternal Grandmother   . Heart attack Sister      Social History   Socioeconomic History  . Marital status: Married    Spouse name: Not on file  . Number of children: Not on file    . Years of education: Not on file  . Highest education level: Not on file  Occupational History  . Not on file  Social Needs  . Financial resource strain: Not on file  . Food insecurity:    Worry: Not on file    Inability: Not on file  . Transportation needs:    Medical: Not on file    Non-medical: Not on file  Tobacco Use  . Smoking status: Former Games developermoker  . Smokeless tobacco: Never Used  Substance and Sexual Activity  . Alcohol use: Yes    Alcohol/week: 1.0 - 2.0 standard drinks    Types: 1 - 2 Standard drinks or equivalent per week    Comment: occ  . Drug use: No  . Sexual activity: Not on file  Lifestyle  . Physical activity:    Days per week: Not on file    Minutes per session: Not on file  . Stress: Not on file  Relationships  . Social connections:    Talks on phone: Not on file    Gets together: Not on file    Attends religious service: Not on file    Active member of club or organization: Not on file    Attends meetings of clubs or organizations: Not on file    Relationship status: Not on file  . Intimate partner violence:    Fear  of current or ex partner: Not on file    Emotionally abused: Not on file    Physically abused: Not on file    Forced sexual activity: Not on file  Other Topics Concern  . Not on file  Social History Narrative   Married.   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   Enjoys working on [employer], yard work, gardening.     PHYSICAL EXAM:  VS: BP (!) 102/58   Pulse 65   Temp 97.9 F (36.6 C)   Wt 183 lb 6.4 oz (83.2 kg)   SpO2 97%   BMI 27.08 kg/m  Physical Exam Gen: NAD, alert, cooperative with exam, well-appearing ENT: normal lips, normal nasal mucosa,  Eye: normal EOM, normal conjunctiva and lids CV:  no edema, +2 pedal pulses   Resp: no accessory muscle use, non-labored,  Skin: no rashes, no areas of induration  Neuro: normal tone, normal sensation to touch Psych:  normal insight, alert and  oriented MSK:  Left shoulder:  Limited active flexion and abduction to about 150 degrees Normal ER  Normal strength to resistance with IR and ER  Pain with empty can testing  Pain with Hawkin's testing  Neurovascularly intact      ASSESSMENT & PLAN:   Left shoulder pain Likely related to rotator cuff and impingement. Possible to have component of degenerative changes. No complete tear suspected.  - prednisone. A1c is 6.7.  - counseled on HEP and supportive care  - pennsaid samples  - if no improvement consider injection and xray

## 2018-10-18 NOTE — Patient Instructions (Signed)
Nice to meet you  Please try the exercises  Please try heat or ice on the area. Perform for 20 minutes at a time and 3-4 times daily  Please see me back in 3-4 weeks if no better.

## 2018-10-19 NOTE — Assessment & Plan Note (Addendum)
Likely related to rotator cuff and impingement. Possible to have component of degenerative changes. No complete tear suspected.  - prednisone. A1c is 6.7.  - counseled on HEP and supportive care  - pennsaid samples  - if no improvement consider injection and xray

## 2018-10-21 DIAGNOSIS — M542 Cervicalgia: Secondary | ICD-10-CM | POA: Diagnosis not present

## 2018-10-21 DIAGNOSIS — M9903 Segmental and somatic dysfunction of lumbar region: Secondary | ICD-10-CM | POA: Diagnosis not present

## 2018-10-21 DIAGNOSIS — M546 Pain in thoracic spine: Secondary | ICD-10-CM | POA: Diagnosis not present

## 2018-10-21 DIAGNOSIS — M545 Low back pain: Secondary | ICD-10-CM | POA: Diagnosis not present

## 2018-10-21 DIAGNOSIS — M9901 Segmental and somatic dysfunction of cervical region: Secondary | ICD-10-CM | POA: Diagnosis not present

## 2018-10-21 DIAGNOSIS — M9902 Segmental and somatic dysfunction of thoracic region: Secondary | ICD-10-CM | POA: Diagnosis not present

## 2018-10-25 DIAGNOSIS — M542 Cervicalgia: Secondary | ICD-10-CM | POA: Diagnosis not present

## 2018-10-25 DIAGNOSIS — M9902 Segmental and somatic dysfunction of thoracic region: Secondary | ICD-10-CM | POA: Diagnosis not present

## 2018-10-25 DIAGNOSIS — M546 Pain in thoracic spine: Secondary | ICD-10-CM | POA: Diagnosis not present

## 2018-10-25 DIAGNOSIS — M545 Low back pain: Secondary | ICD-10-CM | POA: Diagnosis not present

## 2018-10-25 DIAGNOSIS — M9903 Segmental and somatic dysfunction of lumbar region: Secondary | ICD-10-CM | POA: Diagnosis not present

## 2018-10-25 DIAGNOSIS — M9901 Segmental and somatic dysfunction of cervical region: Secondary | ICD-10-CM | POA: Diagnosis not present

## 2018-10-27 DIAGNOSIS — M9901 Segmental and somatic dysfunction of cervical region: Secondary | ICD-10-CM | POA: Diagnosis not present

## 2018-10-27 DIAGNOSIS — M9902 Segmental and somatic dysfunction of thoracic region: Secondary | ICD-10-CM | POA: Diagnosis not present

## 2018-10-27 DIAGNOSIS — M546 Pain in thoracic spine: Secondary | ICD-10-CM | POA: Diagnosis not present

## 2018-10-27 DIAGNOSIS — M542 Cervicalgia: Secondary | ICD-10-CM | POA: Diagnosis not present

## 2018-10-27 DIAGNOSIS — M545 Low back pain: Secondary | ICD-10-CM | POA: Diagnosis not present

## 2018-10-27 DIAGNOSIS — M9903 Segmental and somatic dysfunction of lumbar region: Secondary | ICD-10-CM | POA: Diagnosis not present

## 2018-11-02 DIAGNOSIS — M9901 Segmental and somatic dysfunction of cervical region: Secondary | ICD-10-CM | POA: Diagnosis not present

## 2018-11-02 DIAGNOSIS — M545 Low back pain: Secondary | ICD-10-CM | POA: Diagnosis not present

## 2018-11-02 DIAGNOSIS — M546 Pain in thoracic spine: Secondary | ICD-10-CM | POA: Diagnosis not present

## 2018-11-02 DIAGNOSIS — M9903 Segmental and somatic dysfunction of lumbar region: Secondary | ICD-10-CM | POA: Diagnosis not present

## 2018-11-02 DIAGNOSIS — M9902 Segmental and somatic dysfunction of thoracic region: Secondary | ICD-10-CM | POA: Diagnosis not present

## 2018-11-02 DIAGNOSIS — M542 Cervicalgia: Secondary | ICD-10-CM | POA: Diagnosis not present

## 2018-11-07 DIAGNOSIS — M542 Cervicalgia: Secondary | ICD-10-CM | POA: Diagnosis not present

## 2018-11-07 DIAGNOSIS — M546 Pain in thoracic spine: Secondary | ICD-10-CM | POA: Diagnosis not present

## 2018-11-07 DIAGNOSIS — M545 Low back pain: Secondary | ICD-10-CM | POA: Diagnosis not present

## 2018-11-07 DIAGNOSIS — M9901 Segmental and somatic dysfunction of cervical region: Secondary | ICD-10-CM | POA: Diagnosis not present

## 2018-11-07 DIAGNOSIS — M9902 Segmental and somatic dysfunction of thoracic region: Secondary | ICD-10-CM | POA: Diagnosis not present

## 2018-11-07 DIAGNOSIS — M9903 Segmental and somatic dysfunction of lumbar region: Secondary | ICD-10-CM | POA: Diagnosis not present

## 2018-11-21 ENCOUNTER — Ambulatory Visit: Payer: Medicare HMO | Admitting: Family Medicine

## 2018-11-25 ENCOUNTER — Other Ambulatory Visit: Payer: Self-pay | Admitting: Primary Care

## 2018-11-25 DIAGNOSIS — I10 Essential (primary) hypertension: Secondary | ICD-10-CM

## 2018-11-26 ENCOUNTER — Other Ambulatory Visit: Payer: Self-pay | Admitting: Primary Care

## 2018-12-20 DIAGNOSIS — M9903 Segmental and somatic dysfunction of lumbar region: Secondary | ICD-10-CM | POA: Diagnosis not present

## 2018-12-20 DIAGNOSIS — M545 Low back pain: Secondary | ICD-10-CM | POA: Diagnosis not present

## 2018-12-20 DIAGNOSIS — M546 Pain in thoracic spine: Secondary | ICD-10-CM | POA: Diagnosis not present

## 2018-12-20 DIAGNOSIS — M9902 Segmental and somatic dysfunction of thoracic region: Secondary | ICD-10-CM | POA: Diagnosis not present

## 2018-12-20 DIAGNOSIS — M542 Cervicalgia: Secondary | ICD-10-CM | POA: Diagnosis not present

## 2018-12-20 DIAGNOSIS — M9901 Segmental and somatic dysfunction of cervical region: Secondary | ICD-10-CM | POA: Diagnosis not present

## 2018-12-22 DIAGNOSIS — M545 Low back pain: Secondary | ICD-10-CM | POA: Diagnosis not present

## 2018-12-22 DIAGNOSIS — M9903 Segmental and somatic dysfunction of lumbar region: Secondary | ICD-10-CM | POA: Diagnosis not present

## 2018-12-22 DIAGNOSIS — M9901 Segmental and somatic dysfunction of cervical region: Secondary | ICD-10-CM | POA: Diagnosis not present

## 2018-12-22 DIAGNOSIS — M9902 Segmental and somatic dysfunction of thoracic region: Secondary | ICD-10-CM | POA: Diagnosis not present

## 2018-12-22 DIAGNOSIS — M542 Cervicalgia: Secondary | ICD-10-CM | POA: Diagnosis not present

## 2018-12-22 DIAGNOSIS — M546 Pain in thoracic spine: Secondary | ICD-10-CM | POA: Diagnosis not present

## 2018-12-27 DIAGNOSIS — M9902 Segmental and somatic dysfunction of thoracic region: Secondary | ICD-10-CM | POA: Diagnosis not present

## 2018-12-27 DIAGNOSIS — M546 Pain in thoracic spine: Secondary | ICD-10-CM | POA: Diagnosis not present

## 2018-12-27 DIAGNOSIS — M9901 Segmental and somatic dysfunction of cervical region: Secondary | ICD-10-CM | POA: Diagnosis not present

## 2018-12-27 DIAGNOSIS — M9903 Segmental and somatic dysfunction of lumbar region: Secondary | ICD-10-CM | POA: Diagnosis not present

## 2018-12-27 DIAGNOSIS — M545 Low back pain: Secondary | ICD-10-CM | POA: Diagnosis not present

## 2018-12-27 DIAGNOSIS — M542 Cervicalgia: Secondary | ICD-10-CM | POA: Diagnosis not present

## 2018-12-29 DIAGNOSIS — M542 Cervicalgia: Secondary | ICD-10-CM | POA: Diagnosis not present

## 2018-12-29 DIAGNOSIS — M545 Low back pain: Secondary | ICD-10-CM | POA: Diagnosis not present

## 2018-12-29 DIAGNOSIS — M9903 Segmental and somatic dysfunction of lumbar region: Secondary | ICD-10-CM | POA: Diagnosis not present

## 2018-12-29 DIAGNOSIS — M546 Pain in thoracic spine: Secondary | ICD-10-CM | POA: Diagnosis not present

## 2018-12-29 DIAGNOSIS — M9902 Segmental and somatic dysfunction of thoracic region: Secondary | ICD-10-CM | POA: Diagnosis not present

## 2018-12-29 DIAGNOSIS — M9901 Segmental and somatic dysfunction of cervical region: Secondary | ICD-10-CM | POA: Diagnosis not present

## 2019-01-06 IMAGING — CT CT HEAD W/O CM
3 series · 16 of 46 positions shown, 19 images · non-contrast
Comparison: None.

CLINICAL DATA: Acute posttraumatic headache

EXAM:
CT HEAD WITHOUT CONTRAST
TECHNIQUE: Contiguous axial images were obtained from the base of the skull
through the vertex without intravenous contrast.

[Series 2: head wo · axial · 0.41mm/px · z∈[-169,-49]mm · 10 of 29 slices shown, 13 images]
[im 3/29  brain]
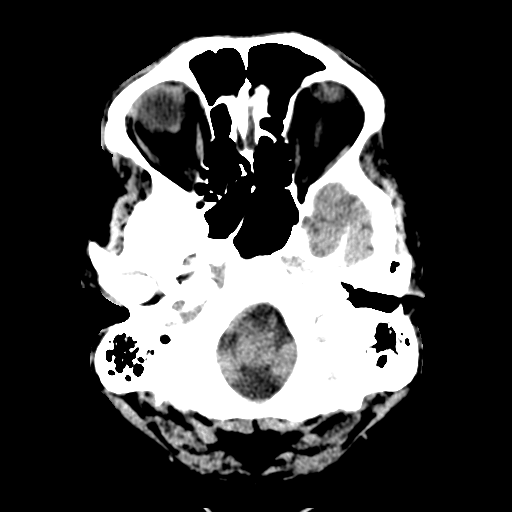
[im 3/29  bone]
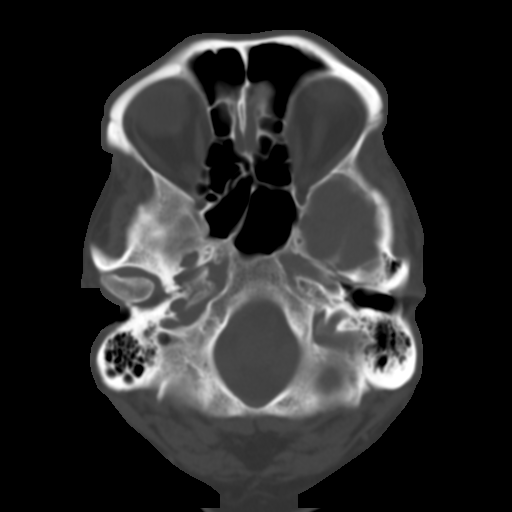
[im 6/29  brain]
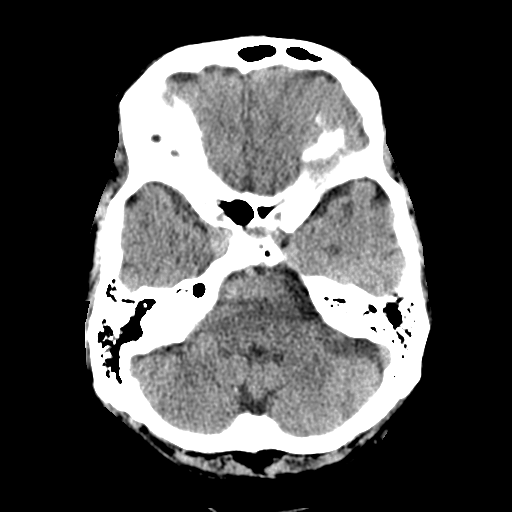
[im 8/29  brain]
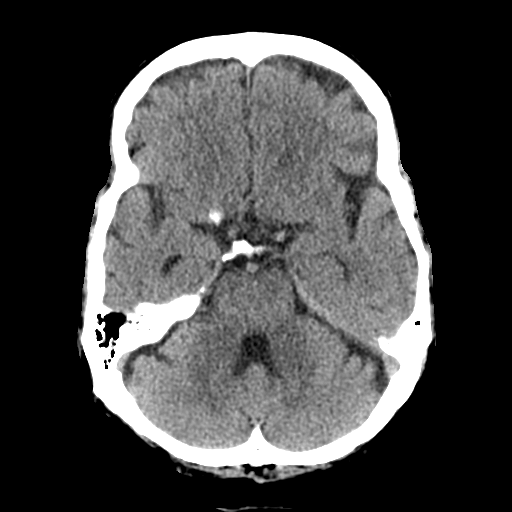
[im 11/29  brain]
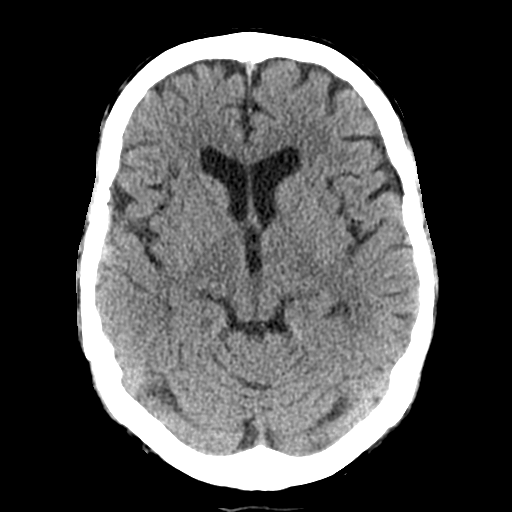
[im 14/29  brain]
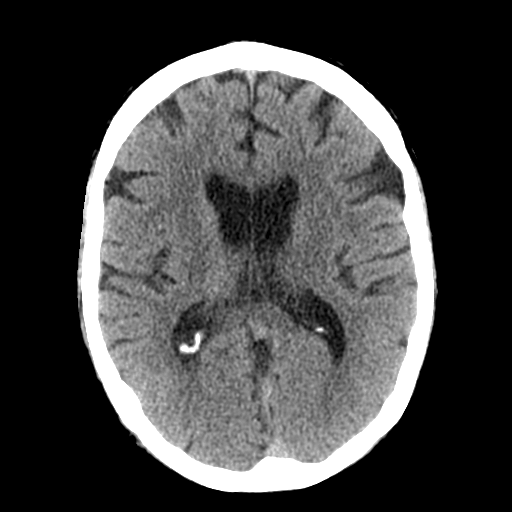
[im 14/29  bone]
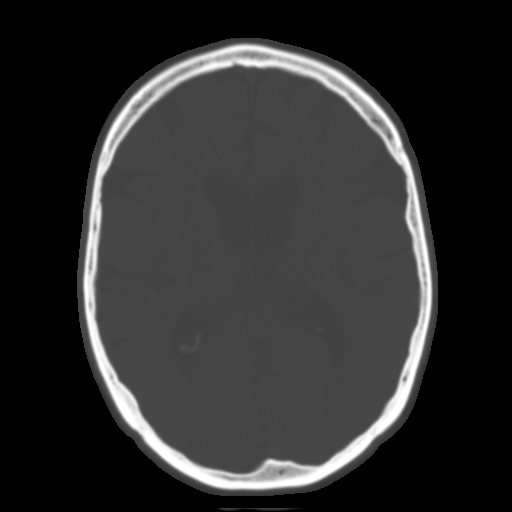
[im 16/29  brain]
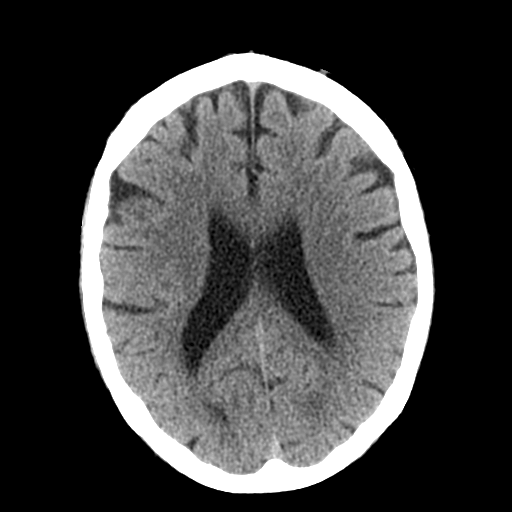
[im 19/29  brain]
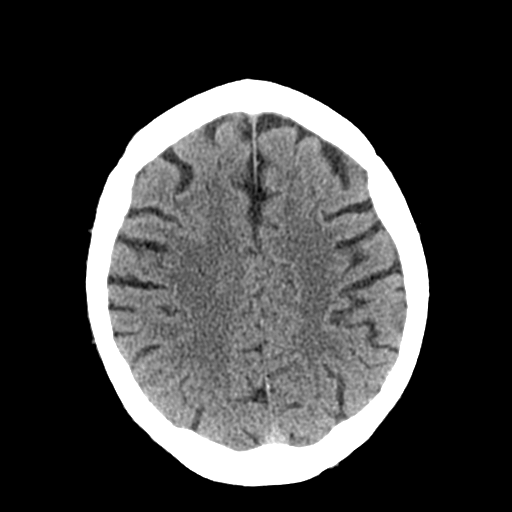
[im 22/29  brain]
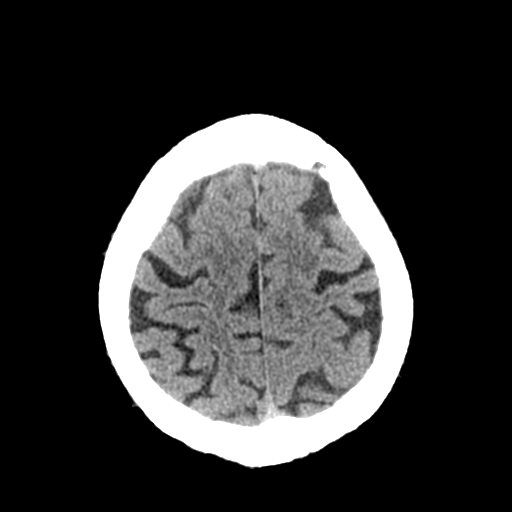
[im 24/29  brain]
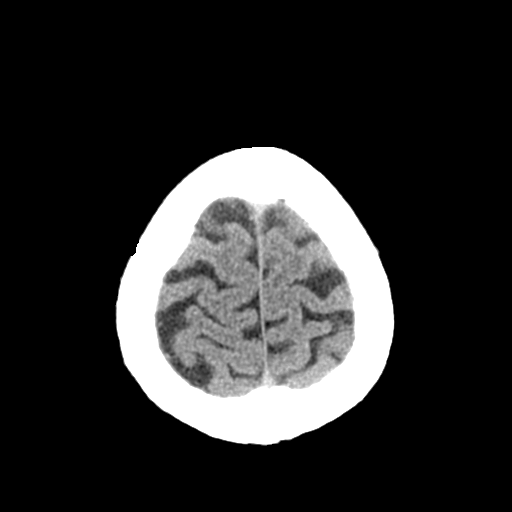
[im 24/29  bone]
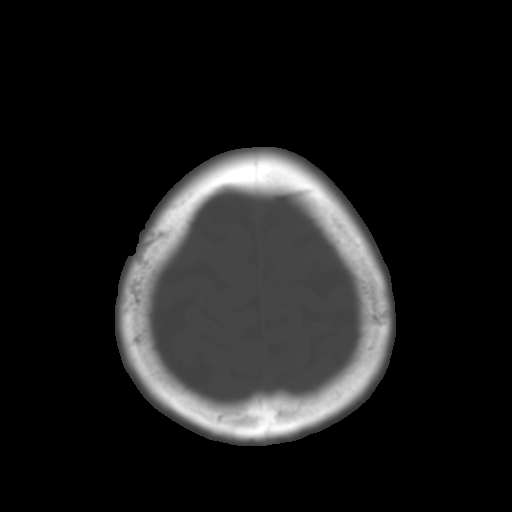
[im 27/29  brain]
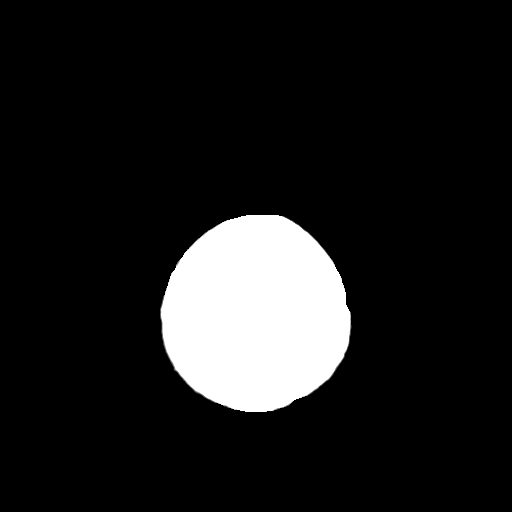

[Series 4: coronal soft tissue · coronal · 0.31mm/px · 3 of 63 slices shown]
[im 21/63  brain]
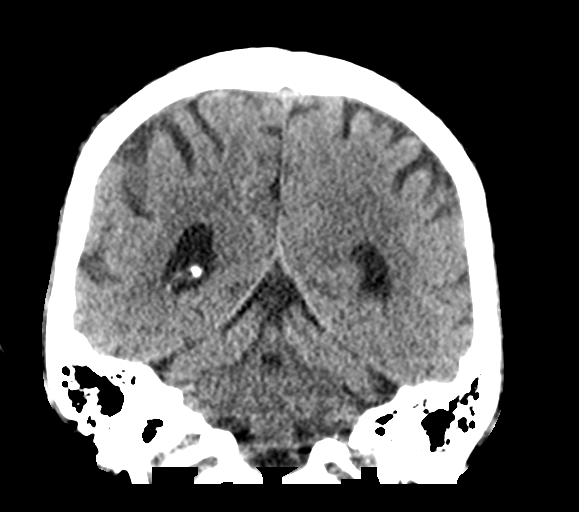
[im 28/63  brain]
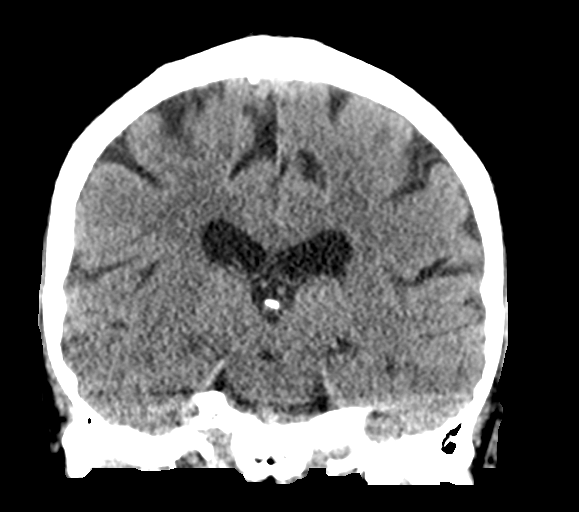
[im 35/63  brain]
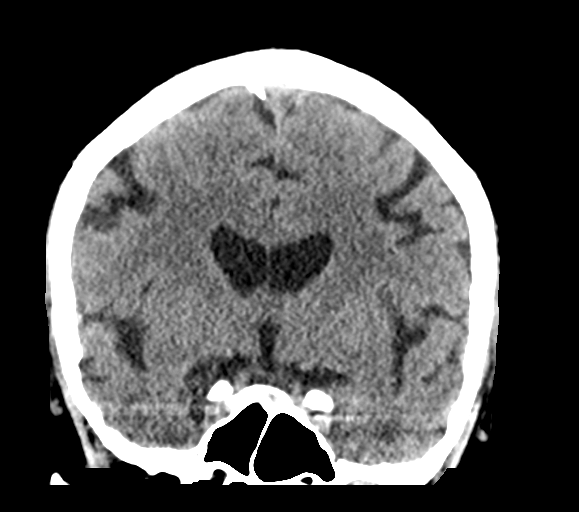

[Series 5: sagittal soft tissue · sagittal · 0.30mm/px · 3 of 52 slices shown]
[im 18/52  brain]
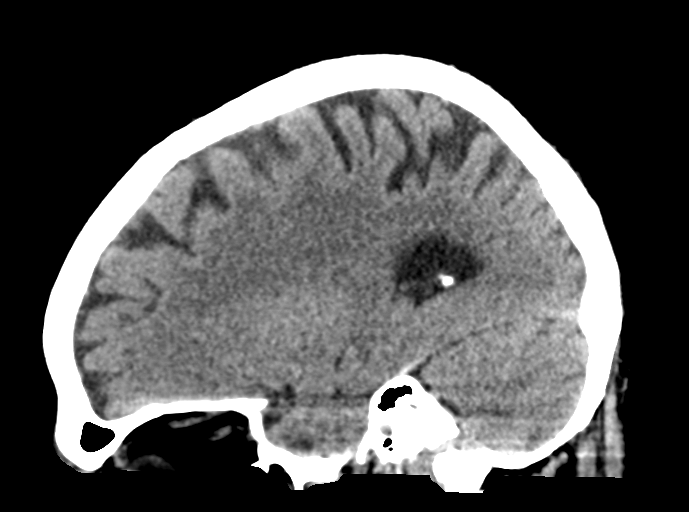
[im 26/52  brain]
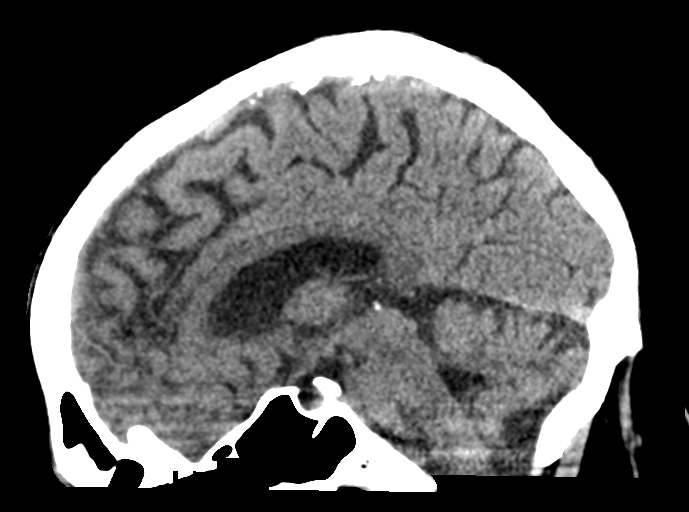
[im 35/52  brain]
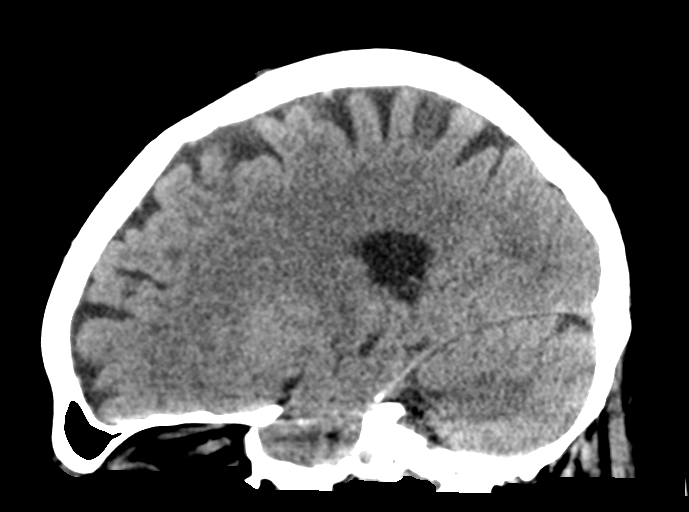

[16 of 46 positions shown; findings below may reference images not displayed]

FINDINGS: Brain: Mild atrophy, consistent with age. Negative for infarct,
hemorrhage, mass. No midline shift.

Vascular: Negative for hyperdense vessel

Skull: Negative

Sinuses/Orbits: Negative

Other: None
IMPRESSION: Negative for age.

## 2019-01-12 DIAGNOSIS — M546 Pain in thoracic spine: Secondary | ICD-10-CM | POA: Diagnosis not present

## 2019-01-12 DIAGNOSIS — M9903 Segmental and somatic dysfunction of lumbar region: Secondary | ICD-10-CM | POA: Diagnosis not present

## 2019-01-12 DIAGNOSIS — M545 Low back pain: Secondary | ICD-10-CM | POA: Diagnosis not present

## 2019-01-12 DIAGNOSIS — M9901 Segmental and somatic dysfunction of cervical region: Secondary | ICD-10-CM | POA: Diagnosis not present

## 2019-01-12 DIAGNOSIS — M9902 Segmental and somatic dysfunction of thoracic region: Secondary | ICD-10-CM | POA: Diagnosis not present

## 2019-01-12 DIAGNOSIS — M542 Cervicalgia: Secondary | ICD-10-CM | POA: Diagnosis not present

## 2019-01-16 DIAGNOSIS — M9902 Segmental and somatic dysfunction of thoracic region: Secondary | ICD-10-CM | POA: Diagnosis not present

## 2019-01-16 DIAGNOSIS — M9903 Segmental and somatic dysfunction of lumbar region: Secondary | ICD-10-CM | POA: Diagnosis not present

## 2019-01-16 DIAGNOSIS — M542 Cervicalgia: Secondary | ICD-10-CM | POA: Diagnosis not present

## 2019-01-16 DIAGNOSIS — M546 Pain in thoracic spine: Secondary | ICD-10-CM | POA: Diagnosis not present

## 2019-01-16 DIAGNOSIS — M545 Low back pain: Secondary | ICD-10-CM | POA: Diagnosis not present

## 2019-01-16 DIAGNOSIS — M9901 Segmental and somatic dysfunction of cervical region: Secondary | ICD-10-CM | POA: Diagnosis not present

## 2019-01-19 DIAGNOSIS — M9902 Segmental and somatic dysfunction of thoracic region: Secondary | ICD-10-CM | POA: Diagnosis not present

## 2019-01-19 DIAGNOSIS — M545 Low back pain: Secondary | ICD-10-CM | POA: Diagnosis not present

## 2019-01-19 DIAGNOSIS — M542 Cervicalgia: Secondary | ICD-10-CM | POA: Diagnosis not present

## 2019-01-19 DIAGNOSIS — M546 Pain in thoracic spine: Secondary | ICD-10-CM | POA: Diagnosis not present

## 2019-01-19 DIAGNOSIS — M9901 Segmental and somatic dysfunction of cervical region: Secondary | ICD-10-CM | POA: Diagnosis not present

## 2019-01-19 DIAGNOSIS — M9903 Segmental and somatic dysfunction of lumbar region: Secondary | ICD-10-CM | POA: Diagnosis not present

## 2019-01-29 ENCOUNTER — Other Ambulatory Visit: Payer: Self-pay | Admitting: Primary Care

## 2019-02-01 ENCOUNTER — Other Ambulatory Visit: Payer: Self-pay | Admitting: Primary Care

## 2019-03-05 ENCOUNTER — Other Ambulatory Visit: Payer: Self-pay | Admitting: Primary Care

## 2019-03-05 DIAGNOSIS — E7800 Pure hypercholesterolemia, unspecified: Secondary | ICD-10-CM

## 2019-03-05 DIAGNOSIS — Z125 Encounter for screening for malignant neoplasm of prostate: Secondary | ICD-10-CM

## 2019-03-05 DIAGNOSIS — I10 Essential (primary) hypertension: Secondary | ICD-10-CM

## 2019-03-05 DIAGNOSIS — E119 Type 2 diabetes mellitus without complications: Secondary | ICD-10-CM

## 2019-03-06 ENCOUNTER — Ambulatory Visit (INDEPENDENT_AMBULATORY_CARE_PROVIDER_SITE_OTHER): Payer: Medicare HMO

## 2019-03-06 VITALS — BP 130/65 | HR 68 | Ht 68.0 in | Wt 173.2 lb

## 2019-03-06 DIAGNOSIS — Z0000 Encounter for general adult medical examination without abnormal findings: Secondary | ICD-10-CM

## 2019-03-06 NOTE — Progress Notes (Signed)
I reviewed health advisor's note, was available for consultation, and agree with documentation and plan.  

## 2019-03-06 NOTE — Patient Instructions (Addendum)
Brian Mullen , Thank you for taking time to come for your Medicare Wellness Visit. I appreciate your ongoing commitment to your health goals. Please review the following plan we discussed and let me know if I can assist you in the future.   These are the goals we discussed: Goals    . Patient Stated     Starting 03/06/19, I will continue to take medications as prescribed.        This is a list of the screening recommended for you and due dates:  Health Maintenance  Topic Date Due  . Hemoglobin A1C  01/13/2019  . Flu Shot  06/17/2019  . Complete foot exam   07/14/2019  . Eye exam for diabetics  12/18/2019  . Cologuard (Stool DNA test)  10/12/2021  . Tetanus Vaccine  09/23/2023  .  Hepatitis C: One time screening is recommended by Center for Disease Control  (CDC) for  adults born from [birth year] through 1965.   Completed  . Pneumonia vaccines  Completed   Preventive Care for Adults  A healthy lifestyle and preventive care can promote health and wellness. Preventive health guidelines for adults include the following key practices.  . A routine yearly physical is a good way to check with your health care provider about your health and preventive screening. It is a chance to share any concerns and updates on your health and to receive a thorough exam.  . Visit your dentist for a routine exam and preventive care every 6 months. Brush your teeth twice a day and floss once a day. Good oral hygiene prevents tooth decay and gum disease.  . The frequency of eye exams is based on your age, health, family medical history, use  of contact lenses, and other factors. Follow your health care provider's recommendations for frequency of eye exams.  . Eat a healthy diet. Foods like vegetables, fruits, whole grains, low-fat dairy products, and lean protein foods contain the nutrients you need without too many calories. Decrease your intake of foods high in solid fats, added sugars, and salt. Eat the right  amount of calories for you. Get information about a proper diet from your health care provider, if necessary.  . Regular physical exercise is one of the most important things you can do for your health. Most adults should get at least 150 minutes of moderate-intensity exercise (any activity that increases your heart rate and causes you to sweat) each week. In addition, most adults need muscle-strengthening exercises on 2 or more days a week.  Silver Sneakers may be a benefit available to you. To determine eligibility, you may visit the website: www.silversneakers.com or contact program at (717)[phone] Mon-Fri between 8AM-8PM.   . Maintain a healthy weight. The body mass index (BMI) is a screening tool to identify possible weight problems. It provides an estimate of body fat based on height and weight. Your health care provider can find your BMI and can help you achieve or maintain a healthy weight.   For adults 20 years and older: ? A BMI below 18.5 is considered underweight. ? A BMI of 18.5 to 24.9 is normal. ? A BMI of 25 to 29.9 is considered overweight. ? A BMI of 30 and above is considered obese.   . Maintain normal blood lipids and cholesterol levels by exercising and minimizing your intake of saturated fat. Eat a balanced diet with plenty of fruit and vegetables. Blood tests for lipids and cholesterol should begin at age 10620 and be  repeated every 5 years. If your lipid or cholesterol levels are high, you are over 50, or you are at high risk for heart disease, you may need your cholesterol levels checked more frequently. Ongoing high lipid and cholesterol levels should be treated with medicines if diet and exercise are not working.  . If you smoke, find out from your health care provider how to quit. If you do not use tobacco, please do not start.  . If you choose to drink alcohol, please do not consume more than 2 drinks per day. One drink is considered to be 12 ounces (355 mL) of beer, 5  ounces (148 mL) of wine, or 1.5 ounces (44 mL) of liquor.  . If you are 54-59 years old, ask your health care provider if you should take aspirin to prevent strokes.  . Use sunscreen. Apply sunscreen liberally and repeatedly throughout the day. You should seek shade when your shadow is shorter than you. Protect yourself by wearing long sleeves, pants, a wide-brimmed hat, and sunglasses year round, whenever you are outdoors.  . Once a month, do a whole body skin exam, using a mirror to look at the skin on your back. Tell your health care provider of new moles, moles that have irregular borders, moles that are larger than a pencil eraser, or moles that have changed in shape or color.

## 2019-03-06 NOTE — Progress Notes (Signed)
Subjective:   Brian Mullen is a 73 y.o. male who presents for Medicare Annual/Subsequent preventive examination.  Review of Systems:  N/A Cardiac Risk Factors include: advanced age (>46mn, >>67women);diabetes mellitus;dyslipidemia;hypertension;male gender     Objective:    Vitals: BP 130/65 Comment: patient supplied vital signs   Pulse 68    Ht 5' 8" (1.727 m)    Wt 173 lb 3.2 oz (78.6 kg)    SpO2 97%    BMI 26.33 kg/m   Body mass index is 26.33 kg/m.  Advanced Directives 03/06/2019 02/22/2018 09/15/2017 11/30/2016  Does Patient Have a Medical Advance Directive? Yes Yes Yes No  Type of AParamedicof ASebewaingLiving will H[place]Living will HLoviliaLiving will -  Does patient want to make changes to medical advance directive? No - Patient declined - No - Patient declined -  Copy of HMetain Chart? No - copy requested No - copy requested No - copy requested -    Tobacco Social History   Tobacco Use  Smoking Status Former Smoker  Smokeless Tobacco Never Used     Counseling given: No   Clinical Intake:  Pre-visit preparation completed: Yes  Pain : No/denies pain Pain Score: 0-No pain     Nutritional Status: BMI 25 -29 Overweight Nutritional Risks: None Diabetes: Yes CBG done?: No Did pt. bring in CBG monitor from home?: No  How often do you need to have someone help you when you read instructions, pamphlets, or other written materials from your doctor or pharmacy?: 1 - Never What is the last grade level you completed in school?: 8th grade  Interpreter Needed?: No  Comments: pt is a widower and lives with others Information entered by :: LPinson, LPN  Past Medical History:  Diagnosis Date   Allergy    Asthma    Diabetes mellitus without complication ([place]    GERD (gastroesophageal reflux disease)    Hypertension    Past Surgical History:  Procedure Laterality Date     ELBOW SURGERY     KNEE SURGERY Right    TONSILLECTOMY     Family History  Problem Relation Age of Onset   Pulmonary embolism Father 481      Deceased   Heart attack Mother 631      Deceased   Diabetes Maternal Grandmother    Heart attack Sister    Social History   Socioeconomic History   Marital status: Married    Spouse name: Not on file   Number of children: Not on file   Years of education: Not on file   Highest education level: Not on file  Occupational History   Not on file  Social Needs   Financial resource strain: Not on file   Food insecurity:    Worry: Not on file    Inability: Not on file   Transportation needs:    Medical: Not on file    Non-medical: Not on file  Tobacco Use   Smoking status: Former Smoker   Smokeless tobacco: Never Used  Substance and Sexual Activity   Alcohol use: Yes    Alcohol/week: 1.0 - 2.0 standard drinks    Types: 1 - 2 Standard drinks or equivalent per week    Comment: occ   Drug use: No   Sexual activity: Not on file  Lifestyle   Physical activity:    Days per week: Not on file  Minutes per session: Not on file   Stress: Not on file  Relationships   Social connections:    Talks on phone: Not on file    Gets together: Not on file    Attends religious service: Not on file    Active member of club or organization: Not on file    Attends meetings of clubs or organizations: Not on file    Relationship status: Not on file  Other Topics Concern   Not on file  Social History Narrative   Married.   No children   Retried. Worked as a Furniture conservator/restorer.   Enjoys working on [employer], yard work, gardening.    Outpatient Encounter Medications as of 03/06/2019  Medication Sig   ACCU-CHEK AVIVA PLUS test strip USE AS INSTRUCTED TO TEST BLOOD SUGAR 2 TIMES DAILY   aspirin 81 MG tablet Take 81 mg by mouth daily.   Blood Glucose Monitoring Suppl (ONE TOUCH ULTRA 2) w/Device KIT Use as  instructed to blood sugar 2 times daily.   fexofenadine (ALLEGRA) 180 MG tablet Take 180 mg by mouth daily.   Lancets (ONETOUCH ULTRASOFT) lancets Use as instructed to test blood sugar 2 times daily.   losartan (COZAAR) 25 MG tablet TAKE 1 TABLET (25 MG TOTAL) BY MOUTH DAILY. NEED APPOINTMENT FOR ANY MORE REFILLS   metFORMIN (GLUCOPHAGE) 500 MG tablet TAKE 2 TABLETS (1,000 MG TOTAL) BY MOUTH 2 (TWO) TIMES DAILY WITH A MEAL.   metoprolol succinate (TOPROL-XL) 25 MG 24 hr tablet TAKE 1 TABLET BY MOUTH DAILY FOR HIGH BLOOD PRESSURE.   montelukast (SINGULAIR) 10 MG tablet TAKE 1 TABLET (10 MG TOTAL) BY MOUTH AT BEDTIME.   sildenafil (REVATIO) 20 MG tablet Take 2-5 tablets by mouth 30 minutes prior to intercourse.   simvastatin (ZOCOR) 10 MG tablet TAKE 1 TABLET(10 MG) BY MOUTH AT BEDTIME   TESTOSTERONE COMPOUNDING KIT TD Place onto the skin. MEDICATION: TESTOSTERONE/DHEA (ATREVIS GEL) (MC) 100 MG/75 MG PER ML GEL DIRECTION: APPLY 5 CLICKS (6.33 ML) PER DAY AS DIRECTED.   tiotropium (SPIRIVA) 18 MCG inhalation capsule Place 1 capsule (18 mcg total) into inhaler and inhale daily.   VENTOLIN HFA 108 (90 Base) MCG/ACT inhaler TAKE 2 PUFFS BY MOUTH EVERY 6 HOURS AS NEEDED FOR WHEEZE OR SHORTNESS OF BREATH   [DISCONTINUED] predniSONE (DELTASONE) 5 MG tablet Take 6 pills for first day, 5 pills second day, 4 pills third day, 3 pills fourth day, 2 pills the fifth day, and 1 pill sixth day.   No facility-administered encounter medications on file as of 03/06/2019.     Activities of Daily Living In your present state of health, do you have any difficulty performing the following activities: 03/06/2019  Hearing? Y  Vision? N  Difficulty concentrating or making decisions? N  Walking or climbing stairs? N  Dressing or bathing? N  Doing errands, shopping? N  Preparing Food and eating ? N  Using the Toilet? N  In the past six months, have you accidently leaked urine? N  Do you have problems with  loss of bowel control? N  Managing your Medications? N  Managing your Finances? N  Housekeeping or managing your Housekeeping? N  Some recent data might be hidden    Patient Care Team: Pleas Koch, NP as PCP - General (Nurse Practitioner)   Assessment:   This is a routine wellness examination for Navjot.  Vision Screening Comments: Vision exam in Feb 2020 @ [facility]  Exercise  Activities and Dietary recommendations Current Exercise Habits: The patient does not participate in regular exercise at present, Exercise limited by: None identified  Goals     Patient Stated     Starting 03/06/19, I will continue to take medications as prescribed.        Fall Risk Fall Risk  03/06/2019 02/22/2018 11/30/2016 10/07/2015 10/29/2014  Falls in the past year? 0 Yes No No No  Comment - 1 fall - fainted; 1 fall - fell backwards off truck - - -  Number falls in past yr: - 2 or more - - -  Injury with Fall? - Yes - - -  Comment - bruising to left side of face - - -  Risk Factor Category  - High Fall Risk - - -  Risk for fall due to : - Impaired balance/gait;Impaired mobility - - -   Depression Screen PHQ 2/9 Scores 03/06/2019 02/22/2018 11/30/2016 10/07/2015  PHQ - 2 Score 0 1 0 0  PHQ- 9 Score 0 6 - -    Cognitive Function MMSE - Mini Mental State Exam 03/06/2019 02/22/2018 11/30/2016  Orientation to time _0 Orientation to Place _1 Registration _2 Attention/ Calculation 0 0 0  Recall _3 Recall-comments unable to recall 1 of 3 words - -  Language- name 2 objects 0 0 0  Language- repeat _4 Language- follow 3 step command 0 3 3  Language- read & follow direction 0 0 0  Write a sentence 0 0 0  Copy design 0 0 0  Total score _5 PLEASE NOTE: A Mini-Cog screen was completed. Maximum score is 17. A value of 0 denotes this part of Folstein MMSE was not completed or the patient failed this part of the Mini-Cog screening.   Mini-Cog  Screening Orientation to Time - Max 5 pts Orientation to Place - Max 5 pts Registration - Max 3 pts Recall - Max 3 pts Language Repeat - Max 1 pts  Immunization History  Administered Date(s) Administered   Influenza,inj,Quad PF,6+ Mos 08/18/2013, 10/29/2014, 11/30/2016   Pneumococcal Conjugate-13 09/22/2013   Pneumococcal Polysaccharide-23 10/29/2014   Td 09/22/2013   Zoster 12/14/2016    Screening Tests Health Maintenance  Topic Date Due   HEMOGLOBIN A1C  01/13/2019   INFLUENZA VACCINE  06/17/2019   FOOT EXAM  07/14/2019   OPHTHALMOLOGY EXAM  12/18/2019   Fecal DNA (Cologuard)  10/12/2021   TETANUS/TDAP  09/23/2023   Hepatitis C Screening  Completed   PNA vac Low Risk Adult  Completed        Plan:     I have personally reviewed, addressed, and noted the following in the patients chart:  A. Medical and social history B. Use of alcohol, tobacco or illicit drugs  C. Current medications and supplements D. Functional ability and status E.  Nutritional status F.  Physical activity G. Advance directives H. List of other physicians I.  Hospitalizations, surgeries, and ER visits in previous 12 months J.  Vitals (unless it is a telemedicine encounter) K. Screenings to include hearing, vision, cognitive, depression L. Referrals and appointments   In addition, I have reviewed and discussed with patient certain preventive protocols, quality metrics, and best practice recommendations. A written personalized care plan for preventive services and recommendations were provided to patient.  With patient's permission, we connected on 03/06/19 at  9:00 AM EDT by  a video enabled telemedicine application. Two patient identifiers were used to ensure the encounter occurred with the correct person. .   Patient was in home and writer was in office.   Signed,   Lindell Noe, MHA, BS, LPN Health Coach

## 2019-03-06 NOTE — Progress Notes (Signed)
PCP notes:   Health maintenance:  A1C - ordered  Abnormal screenings:   Mini-Cog score: 16/17 MMSE - Mini Mental State Exam 03/06/2019 02/22/2018 11/30/2016  Orientation to time 5 5 5   Orientation to Place 5 5 5   Registration 3 3 3   Attention/ Calculation 0 0 0  Recall 2 3 3   Recall-comments unable to recall 1 of 3 words - -  Language- name 2 objects 0 0 0  Language- repeat 1 1 1   Language- follow 3 step command 0 3 3  Language- read & follow direction 0 0 0  Write a sentence 0 0 0  Copy design 0 0 0  Total score 16 20 20    Patient concerns:   None  Nurse concerns:  None  Next PCP appt:   03/08/19 @ 0900

## 2019-03-07 ENCOUNTER — Other Ambulatory Visit: Payer: Self-pay

## 2019-03-07 ENCOUNTER — Other Ambulatory Visit (INDEPENDENT_AMBULATORY_CARE_PROVIDER_SITE_OTHER): Payer: Medicare HMO

## 2019-03-07 DIAGNOSIS — E7800 Pure hypercholesterolemia, unspecified: Secondary | ICD-10-CM

## 2019-03-07 DIAGNOSIS — Z125 Encounter for screening for malignant neoplasm of prostate: Secondary | ICD-10-CM

## 2019-03-07 DIAGNOSIS — I10 Essential (primary) hypertension: Secondary | ICD-10-CM

## 2019-03-07 DIAGNOSIS — E119 Type 2 diabetes mellitus without complications: Secondary | ICD-10-CM | POA: Diagnosis not present

## 2019-03-07 LAB — LIPID PANEL
Cholesterol: 129 mg/dL (ref 0–200)
HDL: 39.4 mg/dL (ref >39.00)
LDL Cholesterol: 68 mg/dL (ref 0–99)
NonHDL: 89.98
Total CHOL/HDL Ratio: 3
Triglycerides: 110 mg/dL (ref 0.0–149.0)
VLDL: 22 mg/dL (ref 0.0–40.0)

## 2019-03-07 LAB — COMPREHENSIVE METABOLIC PANEL
ALT: 9 U/L (ref 0–53)
AST: 13 U/L (ref 0–37)
Albumin: 4.1 g/dL (ref 3.5–5.2)
Alkaline Phosphatase: 44 U/L (ref 39–117)
BUN: 9 mg/dL (ref 6–23)
CO2: 30 mEq/L (ref 19–32)
Calcium: 9.3 mg/dL (ref 8.4–10.5)
Chloride: 101 mEq/L (ref 96–112)
Creatinine, Ser: 0.91 mg/dL (ref 0.40–1.50)
GFR: 81.62 mL/min (ref >60.00)
Glucose, Bld: 127 mg/dL — ABNORMAL HIGH (ref 70–99)
Potassium: 4.3 mEq/L (ref 3.5–5.1)
Sodium: 138 mEq/L (ref 135–145)
Total Bilirubin: 0.6 mg/dL (ref 0.2–1.2)
Total Protein: 6.2 g/dL (ref 6.0–8.3)

## 2019-03-07 LAB — HEMOGLOBIN A1C: Hgb A1c MFr Bld: 6.7 % — ABNORMAL HIGH (ref 4.6–6.5)

## 2019-03-07 LAB — PSA, MEDICARE: PSA: 2.52 ng/ml (ref 0.10–4.00)

## 2019-03-08 ENCOUNTER — Ambulatory Visit (INDEPENDENT_AMBULATORY_CARE_PROVIDER_SITE_OTHER): Payer: Medicare HMO | Admitting: Primary Care

## 2019-03-08 ENCOUNTER — Encounter: Payer: Self-pay | Admitting: Primary Care

## 2019-03-08 VITALS — BP 130/65 | HR 68 | Wt 173.0 lb

## 2019-03-08 DIAGNOSIS — E7800 Pure hypercholesterolemia, unspecified: Secondary | ICD-10-CM

## 2019-03-08 DIAGNOSIS — I10 Essential (primary) hypertension: Secondary | ICD-10-CM

## 2019-03-08 DIAGNOSIS — E119 Type 2 diabetes mellitus without complications: Secondary | ICD-10-CM

## 2019-03-08 DIAGNOSIS — E349 Endocrine disorder, unspecified: Secondary | ICD-10-CM | POA: Diagnosis not present

## 2019-03-08 DIAGNOSIS — J4540 Moderate persistent asthma, uncomplicated: Secondary | ICD-10-CM | POA: Diagnosis not present

## 2019-03-08 DIAGNOSIS — H9313 Tinnitus, bilateral: Secondary | ICD-10-CM

## 2019-03-08 NOTE — Assessment & Plan Note (Signed)
No longer using testosterone replacement treatment or sildenafil.

## 2019-03-08 NOTE — Assessment & Plan Note (Signed)
Stable with home reading. Continue current regimen. BMP reviewed and is UTD.

## 2019-03-08 NOTE — Assessment & Plan Note (Signed)
Recent lipid panel stable with LDL at goal. Continue simvastatin.

## 2019-03-08 NOTE — Assessment & Plan Note (Signed)
Well controlled, using albuterol PRN and appropriately.  Continue to monitor.

## 2019-03-08 NOTE — Patient Instructions (Signed)
Start exercising. You should be getting 150 minutes of exercise weekly.  It is important that you improve your diet. Please limit carbohydrates in the form of white bread, rice, pasta, sweets, fast food, fried food, sugary drinks, etc. Increase your consumption of fresh fruits and vegetables, whole grains, lean protein.  Ensure you are consuming 64 ounces of water daily.  Continue your current medications.  You will be contacted regarding your referral to the audiologist.  Please let us know if you have not been contacted within one week.   Please schedule a follow up appointment in 6 months.   It was a pleasure to see you today! Mayra Reel, NP-C

## 2019-03-08 NOTE — Assessment & Plan Note (Signed)
Chronic, bilaterally. Referral placed to audiology.

## 2019-03-08 NOTE — Progress Notes (Signed)
Subjective:    Patient ID: Brian Mullen, male    DOB: [birth year], 73 y.o.   MRN: [id number]  HPI  Virtual Visit via Video Note  I connected with Brian Mullen on 03/08/19 at  9:00 AM EDT by a video enabled telemedicine application and verified that I am speaking with the correct person using two identifiers.   I discussed the limitations of evaluation and management by telemedicine and the availability of in person appointments. The patient expressed understanding and agreed to proceed. He is at home, I am in the office.  History of Present Illness:  Brian Mullen is a 73 year old male who presents today for follow up of chronic medical conditions and MWV Part 2. He saw our health advisor last week.   Immunizations: -Tetanus: Completed in 2014 -Influenza: Did not complete last season  -Pneumonia:  Completed in 2015 -Shingles: Completed Zostavax in 2018  Eye exam: Completed in February 2020 Colonoscopy: Completed in Cologuard in 2019, negative PSA: 2.52 Hep C Screen: Negative in 2018  2) Type 2 Diabetes:   Current medications include: Metformin 1000 mg BID. He is taking his Metformin 1000 mg once daily.   He is checking his blood glucose 1-2 times daily at various times of the day and is getting readings of:  90's-120's  Last A1C: 6.7 yesterday  Last Eye Exam: February 2020 Last Foot Exam: Due in August 2020 Pneumonia Vaccination: Due in December 2020 ACE/ARB: Losartan  Statin: Simvastatin  3) COPD: Currently managed on Spiriva daily and Ventolin PRN. He is not using Spiriva. He is using his Ventolin infrequently.   4) Essential Hypertension: Currently managed on losartan 25 mg and metoprolol succinate 25 mg daily. He is checking his BP at home which is 130/65, with HR of 68. He denies chest pain, shortness of breath.   5) Testosterone Deficiency: Currently managed on supplemental testosterone and sildenafil. He stopped using supplemental testosterone 2 months ago as he  didn't notice a difference. He is also not using sildenafil.   6) Tinnitus: Intermittent for years, located to bilateral ears. He denies pain. He's tried an OTC tablet called "ring relief" for this and didn't notice improvement. He did purchase a pair of hearing aids online and didn't notice improvement, they caused an increase in symptoms.     Observations/Objective:  Alert and oriented Speaking in complete sentences. Appears well. No distress.  Assessment and Plan:  See problem based charting.  Follow Up Instructions:  Start exercising. You should be getting 150 minutes of exercise weekly.  It is important that you improve your diet. Please limit carbohydrates in the form of white bread, rice, pasta, sweets, fast food, fried food, sugary drinks, etc. Increase your consumption of fresh fruits and vegetables, whole grains, lean protein.  Ensure you are consuming 64 ounces of water daily.  Continue your current medications.  You will be contacted regarding your referral to the audiologist.  Please let us know if you have not been contacted within one week.   Please schedule a follow up appointment in 6 months.   It was a pleasure to see you today! Allie Bossier, NP-C    I discussed the assessment and treatment plan with the patient. The patient was provided an opportunity to ask questions and all were answered. The patient agreed with the plan and demonstrated an understanding of the instructions.   The patient was advised to call back or seek an in-person evaluation if the symptoms worsen or if the  condition fails to improve as anticipated.    Pleas Koch, NP    Review of Systems  Constitutional: Negative for fatigue and unexpected weight change.  Eyes: Negative for visual disturbance.  Respiratory: Negative for shortness of breath.   Cardiovascular: Negative for chest pain.  Gastrointestinal: Negative for abdominal pain.  Genitourinary: Negative for difficulty  urinating.  Neurological: Negative for light-headedness and headaches.       Occasional dizziness       Past Medical History:  Diagnosis Date  . Allergy   . Asthma   . Diabetes mellitus without complication ([place])   . GERD (gastroesophageal reflux disease)   . Hypertension      Social History   Socioeconomic History  . Marital status: Married    Spouse name: Not on file  . Number of children: Not on file  . Years of education: Not on file  . Highest education level: Not on file  Occupational History  . Not on file  Social Needs  . Financial resource strain: Not on file  . Food insecurity:    Worry: Not on file    Inability: Not on file  . Transportation needs:    Medical: Not on file    Non-medical: Not on file  Tobacco Use  . Smoking status: Former Research scientist (life sciences)  . Smokeless tobacco: Never Used  Substance and Sexual Activity  . Alcohol use: Yes    Alcohol/week: 1.0 - 2.0 standard drinks    Types: 1 - 2 Standard drinks or equivalent per week    Comment: occ  . Drug use: No  . Sexual activity: Not on file  Lifestyle  . Physical activity:    Days per week: Not on file    Minutes per session: Not on file  . Stress: Not on file  Relationships  . Social connections:    Talks on phone: Not on file    Gets together: Not on file    Attends religious service: Not on file    Active member of club or organization: Not on file    Attends meetings of clubs or organizations: Not on file    Relationship status: Not on file  . Intimate partner violence:    Fear of current or ex partner: Not on file    Emotionally abused: Not on file    Physically abused: Not on file    Forced sexual activity: Not on file  Other Topics Concern  . Not on file  Social History Narrative   Married.   No children   Retried. Worked as a Furniture conservator/restorer.   Enjoys working on [employer], yard work, gardening.    Past Surgical History:  Procedure Laterality Date  . ELBOW SURGERY    .  KNEE SURGERY Right   . TONSILLECTOMY      Family History  Problem Relation Age of Onset  . Pulmonary embolism Father 34       Deceased  . Heart attack Mother 73       Deceased  . Diabetes Maternal Grandmother   . Heart attack Sister     Allergies  Allergen Reactions  . Coconut Oil Anaphylaxis  . Iodine Anaphylaxis  . Shellfish Allergy Anaphylaxis  . Shellfish-Derived Products Anaphylaxis    Current Outpatient Medications on File Prior to Visit  Medication Sig Dispense Refill  . ACCU-CHEK AVIVA PLUS test strip USE AS INSTRUCTED TO TEST BLOOD SUGAR 2 TIMES DAILY 100 each 1  . aspirin 81  MG tablet Take 81 mg by mouth daily.    . Blood Glucose Monitoring Suppl (ONE TOUCH ULTRA 2) w/Device KIT Use as instructed to blood sugar 2 times daily. 1 each 0  . fexofenadine (ALLEGRA) 180 MG tablet Take 180 mg by mouth daily.    . Lancets (ONETOUCH ULTRASOFT) lancets Use as instructed to test blood sugar 2 times daily. 100 each 5  . losartan (COZAAR) 25 MG tablet TAKE 1 TABLET (25 MG TOTAL) BY MOUTH DAILY. NEED APPOINTMENT FOR ANY MORE REFILLS 90 tablet 2  . metFORMIN (GLUCOPHAGE) 500 MG tablet TAKE 2 TABLETS (1,000 MG TOTAL) BY MOUTH 2 (TWO) TIMES DAILY WITH A MEAL. 360 tablet 1  . metoprolol succinate (TOPROL-XL) 25 MG 24 hr tablet TAKE 1 TABLET BY MOUTH DAILY FOR HIGH BLOOD PRESSURE. 90 tablet 1  . montelukast (SINGULAIR) 10 MG tablet TAKE 1 TABLET (10 MG TOTAL) BY MOUTH AT BEDTIME. 90 tablet 1  . sildenafil (REVATIO) 20 MG tablet Take 2-5 tablets by mouth 30 minutes prior to intercourse. 50 tablet 0  . simvastatin (ZOCOR) 10 MG tablet TAKE 1 TABLET(10 MG) BY MOUTH AT BEDTIME 90 tablet 1  . TESTOSTERONE COMPOUNDING KIT TD Place onto the skin. MEDICATION: TESTOSTERONE/DHEA (ATREVIS GEL) (MC) 100 MG/75 MG PER ML GEL DIRECTION: APPLY 5 CLICKS (1.74 ML) PER DAY AS DIRECTED.    Marland Kitchen tiotropium (SPIRIVA) 18 MCG inhalation capsule Place 1 capsule (18 mcg total) into inhaler and inhale daily. 30  capsule 3  . VENTOLIN HFA 108 (90 Base) MCG/ACT inhaler TAKE 2 PUFFS BY MOUTH EVERY 6 HOURS AS NEEDED FOR WHEEZE OR SHORTNESS OF BREATH 18 Inhaler 3   No current facility-administered medications on file prior to visit.     BP 130/65   Pulse 68   Wt 173 lb (78.5 kg)   SpO2 97%   BMI 26.30 kg/m    Objective:   Physical Exam  Constitutional: He is oriented to person, place, and time. He appears well-nourished.  Respiratory: Effort normal. No respiratory distress.  Neurological: He is alert and oriented to person, place, and time.  Skin: Skin is dry.  Psychiatric: He has a normal mood and affect.           Assessment & Plan:

## 2019-03-08 NOTE — Assessment & Plan Note (Signed)
Well controlled with Metformin 1000 mg once daily. With A1C of 6.7. He will continue to monitor glucose and report readings that run consistently above 150.  Foot exam due next visit. Pneumonia vaccination due this Fall. Managed on statin and ARB.  Follow up in 6 months.

## 2019-03-10 DIAGNOSIS — M9901 Segmental and somatic dysfunction of cervical region: Secondary | ICD-10-CM | POA: Diagnosis not present

## 2019-03-10 DIAGNOSIS — M9903 Segmental and somatic dysfunction of lumbar region: Secondary | ICD-10-CM | POA: Diagnosis not present

## 2019-03-10 DIAGNOSIS — M9902 Segmental and somatic dysfunction of thoracic region: Secondary | ICD-10-CM | POA: Diagnosis not present

## 2019-03-10 DIAGNOSIS — M545 Low back pain: Secondary | ICD-10-CM | POA: Diagnosis not present

## 2019-03-10 DIAGNOSIS — M542 Cervicalgia: Secondary | ICD-10-CM | POA: Diagnosis not present

## 2019-03-10 DIAGNOSIS — M546 Pain in thoracic spine: Secondary | ICD-10-CM | POA: Diagnosis not present

## 2019-03-11 ENCOUNTER — Other Ambulatory Visit: Payer: Self-pay | Admitting: Primary Care

## 2019-03-13 DIAGNOSIS — M9901 Segmental and somatic dysfunction of cervical region: Secondary | ICD-10-CM | POA: Diagnosis not present

## 2019-03-13 DIAGNOSIS — M9903 Segmental and somatic dysfunction of lumbar region: Secondary | ICD-10-CM | POA: Diagnosis not present

## 2019-03-13 DIAGNOSIS — M545 Low back pain: Secondary | ICD-10-CM | POA: Diagnosis not present

## 2019-03-13 DIAGNOSIS — M542 Cervicalgia: Secondary | ICD-10-CM | POA: Diagnosis not present

## 2019-03-13 DIAGNOSIS — M546 Pain in thoracic spine: Secondary | ICD-10-CM | POA: Diagnosis not present

## 2019-03-13 DIAGNOSIS — M9902 Segmental and somatic dysfunction of thoracic region: Secondary | ICD-10-CM | POA: Diagnosis not present

## 2019-03-22 DIAGNOSIS — M9902 Segmental and somatic dysfunction of thoracic region: Secondary | ICD-10-CM | POA: Diagnosis not present

## 2019-03-22 DIAGNOSIS — M545 Low back pain: Secondary | ICD-10-CM | POA: Diagnosis not present

## 2019-03-22 DIAGNOSIS — M9903 Segmental and somatic dysfunction of lumbar region: Secondary | ICD-10-CM | POA: Diagnosis not present

## 2019-03-22 DIAGNOSIS — M542 Cervicalgia: Secondary | ICD-10-CM | POA: Diagnosis not present

## 2019-03-22 DIAGNOSIS — M546 Pain in thoracic spine: Secondary | ICD-10-CM | POA: Diagnosis not present

## 2019-03-22 DIAGNOSIS — M9901 Segmental and somatic dysfunction of cervical region: Secondary | ICD-10-CM | POA: Diagnosis not present

## 2019-03-24 DIAGNOSIS — M9902 Segmental and somatic dysfunction of thoracic region: Secondary | ICD-10-CM | POA: Diagnosis not present

## 2019-03-24 DIAGNOSIS — M9901 Segmental and somatic dysfunction of cervical region: Secondary | ICD-10-CM | POA: Diagnosis not present

## 2019-03-24 DIAGNOSIS — M545 Low back pain: Secondary | ICD-10-CM | POA: Diagnosis not present

## 2019-03-24 DIAGNOSIS — M542 Cervicalgia: Secondary | ICD-10-CM | POA: Diagnosis not present

## 2019-03-24 DIAGNOSIS — M546 Pain in thoracic spine: Secondary | ICD-10-CM | POA: Diagnosis not present

## 2019-03-24 DIAGNOSIS — M9903 Segmental and somatic dysfunction of lumbar region: Secondary | ICD-10-CM | POA: Diagnosis not present

## 2019-03-27 DIAGNOSIS — M542 Cervicalgia: Secondary | ICD-10-CM | POA: Diagnosis not present

## 2019-03-27 DIAGNOSIS — M9901 Segmental and somatic dysfunction of cervical region: Secondary | ICD-10-CM | POA: Diagnosis not present

## 2019-03-27 DIAGNOSIS — M9903 Segmental and somatic dysfunction of lumbar region: Secondary | ICD-10-CM | POA: Diagnosis not present

## 2019-03-27 DIAGNOSIS — M545 Low back pain: Secondary | ICD-10-CM | POA: Diagnosis not present

## 2019-03-27 DIAGNOSIS — M9902 Segmental and somatic dysfunction of thoracic region: Secondary | ICD-10-CM | POA: Diagnosis not present

## 2019-03-27 DIAGNOSIS — M546 Pain in thoracic spine: Secondary | ICD-10-CM | POA: Diagnosis not present

## 2019-03-28 ENCOUNTER — Telehealth: Payer: Self-pay | Admitting: Primary Care

## 2019-03-28 NOTE — Telephone Encounter (Signed)
Patient said Brian Mullen did a referral for Audiology.  Patient checked with his insurance to find out who he could go and see.  Patient said Dr.Frances Albanese-phone number [phone] and Dr. Eulogio Ditch- phone number [phone]. Patient said he can go to the appointment anytime.

## 2019-03-29 DIAGNOSIS — M9901 Segmental and somatic dysfunction of cervical region: Secondary | ICD-10-CM | POA: Diagnosis not present

## 2019-03-29 DIAGNOSIS — M9903 Segmental and somatic dysfunction of lumbar region: Secondary | ICD-10-CM | POA: Diagnosis not present

## 2019-03-29 DIAGNOSIS — M545 Low back pain: Secondary | ICD-10-CM | POA: Diagnosis not present

## 2019-03-29 DIAGNOSIS — M9902 Segmental and somatic dysfunction of thoracic region: Secondary | ICD-10-CM | POA: Diagnosis not present

## 2019-03-29 DIAGNOSIS — M546 Pain in thoracic spine: Secondary | ICD-10-CM | POA: Diagnosis not present

## 2019-03-29 DIAGNOSIS — M542 Cervicalgia: Secondary | ICD-10-CM | POA: Diagnosis not present

## 2019-03-29 NOTE — Telephone Encounter (Signed)
Appt made and patient aware.  

## 2019-04-02 ENCOUNTER — Other Ambulatory Visit: Payer: Self-pay | Admitting: Primary Care

## 2019-04-03 DIAGNOSIS — M9901 Segmental and somatic dysfunction of cervical region: Secondary | ICD-10-CM | POA: Diagnosis not present

## 2019-04-03 DIAGNOSIS — M546 Pain in thoracic spine: Secondary | ICD-10-CM | POA: Diagnosis not present

## 2019-04-03 DIAGNOSIS — M545 Low back pain: Secondary | ICD-10-CM | POA: Diagnosis not present

## 2019-04-03 DIAGNOSIS — M9903 Segmental and somatic dysfunction of lumbar region: Secondary | ICD-10-CM | POA: Diagnosis not present

## 2019-04-03 DIAGNOSIS — M9902 Segmental and somatic dysfunction of thoracic region: Secondary | ICD-10-CM | POA: Diagnosis not present

## 2019-04-03 DIAGNOSIS — M542 Cervicalgia: Secondary | ICD-10-CM | POA: Diagnosis not present

## 2019-04-07 DIAGNOSIS — M9901 Segmental and somatic dysfunction of cervical region: Secondary | ICD-10-CM | POA: Diagnosis not present

## 2019-04-07 DIAGNOSIS — M9902 Segmental and somatic dysfunction of thoracic region: Secondary | ICD-10-CM | POA: Diagnosis not present

## 2019-04-07 DIAGNOSIS — M9903 Segmental and somatic dysfunction of lumbar region: Secondary | ICD-10-CM | POA: Diagnosis not present

## 2019-04-07 DIAGNOSIS — M542 Cervicalgia: Secondary | ICD-10-CM | POA: Diagnosis not present

## 2019-04-07 DIAGNOSIS — M545 Low back pain: Secondary | ICD-10-CM | POA: Diagnosis not present

## 2019-04-07 DIAGNOSIS — M546 Pain in thoracic spine: Secondary | ICD-10-CM | POA: Diagnosis not present

## 2019-04-12 DIAGNOSIS — M9903 Segmental and somatic dysfunction of lumbar region: Secondary | ICD-10-CM | POA: Diagnosis not present

## 2019-04-12 DIAGNOSIS — M542 Cervicalgia: Secondary | ICD-10-CM | POA: Diagnosis not present

## 2019-04-12 DIAGNOSIS — M545 Low back pain: Secondary | ICD-10-CM | POA: Diagnosis not present

## 2019-04-12 DIAGNOSIS — M546 Pain in thoracic spine: Secondary | ICD-10-CM | POA: Diagnosis not present

## 2019-04-12 DIAGNOSIS — M9902 Segmental and somatic dysfunction of thoracic region: Secondary | ICD-10-CM | POA: Diagnosis not present

## 2019-04-12 DIAGNOSIS — M9901 Segmental and somatic dysfunction of cervical region: Secondary | ICD-10-CM | POA: Diagnosis not present

## 2019-04-14 DIAGNOSIS — M542 Cervicalgia: Secondary | ICD-10-CM | POA: Diagnosis not present

## 2019-04-14 DIAGNOSIS — M545 Low back pain: Secondary | ICD-10-CM | POA: Diagnosis not present

## 2019-04-14 DIAGNOSIS — M9901 Segmental and somatic dysfunction of cervical region: Secondary | ICD-10-CM | POA: Diagnosis not present

## 2019-04-14 DIAGNOSIS — M9903 Segmental and somatic dysfunction of lumbar region: Secondary | ICD-10-CM | POA: Diagnosis not present

## 2019-04-14 DIAGNOSIS — M546 Pain in thoracic spine: Secondary | ICD-10-CM | POA: Diagnosis not present

## 2019-04-14 DIAGNOSIS — M9902 Segmental and somatic dysfunction of thoracic region: Secondary | ICD-10-CM | POA: Diagnosis not present

## 2019-04-17 DIAGNOSIS — M542 Cervicalgia: Secondary | ICD-10-CM | POA: Diagnosis not present

## 2019-04-17 DIAGNOSIS — M9902 Segmental and somatic dysfunction of thoracic region: Secondary | ICD-10-CM | POA: Diagnosis not present

## 2019-04-17 DIAGNOSIS — M545 Low back pain: Secondary | ICD-10-CM | POA: Diagnosis not present

## 2019-04-17 DIAGNOSIS — M9903 Segmental and somatic dysfunction of lumbar region: Secondary | ICD-10-CM | POA: Diagnosis not present

## 2019-04-17 DIAGNOSIS — M546 Pain in thoracic spine: Secondary | ICD-10-CM | POA: Diagnosis not present

## 2019-04-17 DIAGNOSIS — M9901 Segmental and somatic dysfunction of cervical region: Secondary | ICD-10-CM | POA: Diagnosis not present

## 2019-04-19 DIAGNOSIS — M9903 Segmental and somatic dysfunction of lumbar region: Secondary | ICD-10-CM | POA: Diagnosis not present

## 2019-04-19 DIAGNOSIS — M9902 Segmental and somatic dysfunction of thoracic region: Secondary | ICD-10-CM | POA: Diagnosis not present

## 2019-04-19 DIAGNOSIS — M9901 Segmental and somatic dysfunction of cervical region: Secondary | ICD-10-CM | POA: Diagnosis not present

## 2019-04-19 DIAGNOSIS — M546 Pain in thoracic spine: Secondary | ICD-10-CM | POA: Diagnosis not present

## 2019-04-19 DIAGNOSIS — M545 Low back pain: Secondary | ICD-10-CM | POA: Diagnosis not present

## 2019-04-19 DIAGNOSIS — M542 Cervicalgia: Secondary | ICD-10-CM | POA: Diagnosis not present

## 2019-05-14 ENCOUNTER — Other Ambulatory Visit: Payer: Self-pay | Admitting: Primary Care

## 2019-05-14 DIAGNOSIS — E785 Hyperlipidemia, unspecified: Secondary | ICD-10-CM

## 2019-05-21 ENCOUNTER — Other Ambulatory Visit: Payer: Self-pay | Admitting: Primary Care

## 2019-05-22 ENCOUNTER — Telehealth: Payer: Self-pay

## 2019-05-22 NOTE — Telephone Encounter (Signed)
Spoke with patient regarding his symptoms.   Patient states that his only symptom in the past week was increased shortness of breath and wheezing that was not being improved by his inhaler which normally is the case with this seasonal condition.  He spoke with the triage nurse and he had no other Covid suspicious symptoms and feels fine otherwise.  He then realized that he was only taking 1 puff at a time instead of 2 and started taking correctly.   He states that since this change he has felt better today with his breathing.  He did inquire about testing and I told him that it could be done if he wanted.  He also states that he has failed on virtual visit tries before but would be willing to try again if provider would like to consult with him.   Will forward to K. Carlis Abbott, NP for further advice/instruction.  Currently, he is doing fine without breathing trouble today and the 2 puffs q6hrs is working at the present.  He will call us if any further symptoms develop. He does not need a refill on his medication.

## 2019-05-22 NOTE — Telephone Encounter (Signed)
[facility] - Client TELEPHONE ADVICE RECORD AccessNurse Patient Name: Brian FleetingFRANK Tilly Gender: Male DOB: 06-19-1946 Age: [age] Y 4 M 23 D Return Phone Number: [phone] (Primary) Address: [place]: [place] [id number] Client [facility] - Client [facility] Night Physician Vernona Riegerlark, Katherine - NP Contact Type Call Who Is Calling Patient / Member / Family / Caregiver Call Type Triage / Clinical Caller Name Brian FleetingFrank Toback Relationship To Patient Self Return Phone Number [phone] (Primary) Chief Complaint BREATHING - shortness of breath or sounds breathless Reason for Call Symptomatic / Request for Health Information Initial Comment Caller states, he has asthma, he is having difficulty breathe currently. Lasting longer asking how to get tested COVID-19. Translation No Nurse Assessment Nurse: Deatra JamesNoe, RN, Corrie DandyMary Date/Time [name](Eastern Time): 05/21/2019 1:44:18 PM Confirm and document reason for call. If symptomatic, describe symptoms. ---Patient states he has asthma and he normally uses his inhaler during season changes and he is having to use his inhaler more often than normal. He is short of breath more than normal but nothing extreme Has the patient had close contact with a person known or suspected to have the novel coronavirus illness OR traveled / lives in area with major community spread (including international travel) in the last 14 days from the onset of symptoms? * If Asymptomatic, screen for exposure and travel within the last 14 days. ---No Does the patient have any new or worsening symptoms? ---Yes Will a triage be completed? ---Yes Related visit to physician within the last 2 weeks? ---No Does the PT have any chronic conditions? (i.e. diabetes, asthma, this includes High risk factors for pregnancy, etc.) ---Yes List chronic conditions. ---"asthma, diabetes" Is this a  behavioral health or substance abuse call? ---No Guidelines Guideline Title Affirmed Question Affirmed Notes Nurse Date/Time (Eastern Time) Coronavirus (COVID-19) - Diagnosed or Suspected MILD difficulty breathing (e.g., minimal/no SOB at Carvel Gettingoe, RN, Mary 05/21/2019 1:46:22 PM PLEASE NOTE: All timestamps contained within this report are represented as [place] Standard Time. CONFIDENTIALTY NOTICE: This fax transmission is intended only for the addressee. It contains information that is legally privileged, confidential or otherwise protected from use or disclosure. If you are not the intended recipient, you are strictly prohibited from reviewing, disclosing, copying using or disseminating any of this information or taking any action in reliance on or regarding this information. If you have received this fax in error, please notify us immediately by telephone so that we can arrange for its return to us. Phone: [phone], Toll-Free: [phone], Fax: [phone] Page: 2 of 2 Call Id: [id number] Guidelines Guideline Title Affirmed Question Affirmed Notes Nurse Date/Time [name](Eastern Time) rest, SOB with walking, pulse <100) Disp. Time [name](Eastern Time) Disposition Final User 05/21/2019 1:43:27 PM Send to Urgent [place] McDonald, Cassie 05/21/2019 1:51:38 PM Call PCP Now Yes Deatra JamesNoe, RN, Jenelle MagesMary Caller Disagree/Comply Comply Caller Understands Yes PreDisposition Call Doctor Care Advice Given Per Guideline CALL PCP NOW: * You need to discuss this with your doctor (or NP/PA). ALTERNATE DISPOSITION - CALL TELEMEDICINE PROVIDER NOW: * Telemedicine may be your best choice for care during this COVID-19 outbreak. * You should call a telemedicine provider now, if your own healthcare provider is not available. NOTE TO TRIAGER - IF NO PCP, HAVE OTHER HCP RE-TRIAGE THE PATIENT, IF AVAILABLE: * During this COVID-19 pandemic, the medical community is trying to prevent any unnecessary referrals to medical facilities. Many  patients are very fearful of being exposed to COVID-19  in medical settings. Re-triage by a physician has been shown to reduce ED referrals. Telemedicine is a preferred source of care and triage during this pandemic. Here are options for the triager to consider: HOW TO PROTECT OTHERS - WHEN YOU ARE SICK WITH COVID-19: * STAY HOME: Stay home from school or work if you are sick. Do NOT go to religious services, child care centers, shopping, or other public places. Do NOT use public transportation (e.g., bus, taxis, ride-sharing). Do NOT allow any visitors to your home. Leave the house only if you need to seek urgent medical care. * COVER THE COUGH: Cough and sneeze into your shirt sleeve or inner elbow. Don't cough into your hand or the air. If available, cough into a tissue and throw it into a trash can. * [place] HANDS OFTEN: Wash hands often with soap and water. After coughing or sneezing are important times. * WEAR A MASK: Wear a facemask when around others. Always wear a facemask (if available) if you have to leave your home (such as going to a medical facility). GENERAL CARE ADVICE FOR COVID-19 SYMPTOMS: * Cough: Use cough drops. * Feeling dehydrated: Drink extra liquids. If the air in your home is dry, use a humidifier. * Fever: For fever over 101 F (38.3 C), take acetaminophen every 4-6 hours (Adults 650 mg) OR ibuprofen every 6-8 hours (Adults 400 mg). Before taking any medicine, read all the instructions on the package. * Muscle aches, headache, and other pains: Often this comes and goes with the fever. Take acetaminophen every 4-6 hours (Adults 650 mg) OR ibuprofen every 6-8 hours (Adults 400 mg). Before taking any medicine, read all the instructions on the package. * Sore throat: Try throat lozenges, hard candy or warm chicken broth. COUGH MEDICINES: CALL BACK IF: * You become worse. CARE ADVICE given per CORONAVIRUS (COVID-19) - DIAGNOSED OR SUSPECTED (Adult) guideline.

## 2019-05-22 NOTE — Telephone Encounter (Signed)
Doesn't seem like Covid symptoms, especially if he's doing better with the 2 puffs of albuterol.  Would recommend testing if he's been out in public without a mask and around others without masks. Also if he develops fatigue, fever, cough, increased SOB, etc.

## 2019-05-23 NOTE — Telephone Encounter (Signed)
Spoken and notified patient of Tawni Millers comments. Patient verbalized understanding. Patient will watch if he has more symptoms then he will call and let us know about getting COVID testing.

## 2019-06-05 DIAGNOSIS — M9901 Segmental and somatic dysfunction of cervical region: Secondary | ICD-10-CM | POA: Diagnosis not present

## 2019-06-05 DIAGNOSIS — M9902 Segmental and somatic dysfunction of thoracic region: Secondary | ICD-10-CM | POA: Diagnosis not present

## 2019-06-05 DIAGNOSIS — M546 Pain in thoracic spine: Secondary | ICD-10-CM | POA: Diagnosis not present

## 2019-06-05 DIAGNOSIS — M9903 Segmental and somatic dysfunction of lumbar region: Secondary | ICD-10-CM | POA: Diagnosis not present

## 2019-06-05 DIAGNOSIS — M545 Low back pain: Secondary | ICD-10-CM | POA: Diagnosis not present

## 2019-06-05 DIAGNOSIS — M542 Cervicalgia: Secondary | ICD-10-CM | POA: Diagnosis not present

## 2019-06-07 DIAGNOSIS — M9902 Segmental and somatic dysfunction of thoracic region: Secondary | ICD-10-CM | POA: Diagnosis not present

## 2019-06-07 DIAGNOSIS — M542 Cervicalgia: Secondary | ICD-10-CM | POA: Diagnosis not present

## 2019-06-07 DIAGNOSIS — M546 Pain in thoracic spine: Secondary | ICD-10-CM | POA: Diagnosis not present

## 2019-06-07 DIAGNOSIS — M9903 Segmental and somatic dysfunction of lumbar region: Secondary | ICD-10-CM | POA: Diagnosis not present

## 2019-06-07 DIAGNOSIS — M9901 Segmental and somatic dysfunction of cervical region: Secondary | ICD-10-CM | POA: Diagnosis not present

## 2019-06-07 DIAGNOSIS — M545 Low back pain: Secondary | ICD-10-CM | POA: Diagnosis not present

## 2019-06-09 DIAGNOSIS — M545 Low back pain: Secondary | ICD-10-CM | POA: Diagnosis not present

## 2019-06-09 DIAGNOSIS — M9903 Segmental and somatic dysfunction of lumbar region: Secondary | ICD-10-CM | POA: Diagnosis not present

## 2019-06-09 DIAGNOSIS — M9901 Segmental and somatic dysfunction of cervical region: Secondary | ICD-10-CM | POA: Diagnosis not present

## 2019-06-09 DIAGNOSIS — M546 Pain in thoracic spine: Secondary | ICD-10-CM | POA: Diagnosis not present

## 2019-06-09 DIAGNOSIS — M542 Cervicalgia: Secondary | ICD-10-CM | POA: Diagnosis not present

## 2019-06-09 DIAGNOSIS — M9902 Segmental and somatic dysfunction of thoracic region: Secondary | ICD-10-CM | POA: Diagnosis not present

## 2019-06-12 ENCOUNTER — Other Ambulatory Visit: Payer: Self-pay | Admitting: Primary Care

## 2019-06-12 DIAGNOSIS — M9901 Segmental and somatic dysfunction of cervical region: Secondary | ICD-10-CM | POA: Diagnosis not present

## 2019-06-12 DIAGNOSIS — M9903 Segmental and somatic dysfunction of lumbar region: Secondary | ICD-10-CM | POA: Diagnosis not present

## 2019-06-12 DIAGNOSIS — M546 Pain in thoracic spine: Secondary | ICD-10-CM | POA: Diagnosis not present

## 2019-06-12 DIAGNOSIS — M542 Cervicalgia: Secondary | ICD-10-CM | POA: Diagnosis not present

## 2019-06-12 DIAGNOSIS — M9902 Segmental and somatic dysfunction of thoracic region: Secondary | ICD-10-CM | POA: Diagnosis not present

## 2019-06-12 DIAGNOSIS — M545 Low back pain: Secondary | ICD-10-CM | POA: Diagnosis not present

## 2019-06-12 DIAGNOSIS — I10 Essential (primary) hypertension: Secondary | ICD-10-CM

## 2019-06-14 DIAGNOSIS — M546 Pain in thoracic spine: Secondary | ICD-10-CM | POA: Diagnosis not present

## 2019-06-14 DIAGNOSIS — M545 Low back pain: Secondary | ICD-10-CM | POA: Diagnosis not present

## 2019-06-14 DIAGNOSIS — M9901 Segmental and somatic dysfunction of cervical region: Secondary | ICD-10-CM | POA: Diagnosis not present

## 2019-06-14 DIAGNOSIS — M9902 Segmental and somatic dysfunction of thoracic region: Secondary | ICD-10-CM | POA: Diagnosis not present

## 2019-06-14 DIAGNOSIS — M9903 Segmental and somatic dysfunction of lumbar region: Secondary | ICD-10-CM | POA: Diagnosis not present

## 2019-06-14 DIAGNOSIS — M542 Cervicalgia: Secondary | ICD-10-CM | POA: Diagnosis not present

## 2019-06-22 DIAGNOSIS — H903 Sensorineural hearing loss, bilateral: Secondary | ICD-10-CM | POA: Diagnosis not present

## 2019-06-22 DIAGNOSIS — H9113 Presbycusis, bilateral: Secondary | ICD-10-CM | POA: Diagnosis not present

## 2019-06-22 DIAGNOSIS — H9313 Tinnitus, bilateral: Secondary | ICD-10-CM | POA: Diagnosis not present

## 2019-06-28 ENCOUNTER — Ambulatory Visit (INDEPENDENT_AMBULATORY_CARE_PROVIDER_SITE_OTHER): Payer: Medicare HMO | Admitting: Orthopedic Surgery

## 2019-06-28 ENCOUNTER — Ambulatory Visit: Payer: Medicare HMO

## 2019-06-28 ENCOUNTER — Other Ambulatory Visit: Payer: Self-pay

## 2019-06-28 ENCOUNTER — Encounter: Payer: Self-pay | Admitting: Orthopedic Surgery

## 2019-06-28 VITALS — BP 110/61 | HR 79 | Ht 69.0 in | Wt 167.0 lb

## 2019-06-28 DIAGNOSIS — M25562 Pain in left knee: Secondary | ICD-10-CM | POA: Diagnosis not present

## 2019-06-28 DIAGNOSIS — G8929 Other chronic pain: Secondary | ICD-10-CM

## 2019-06-28 DIAGNOSIS — M170 Bilateral primary osteoarthritis of knee: Secondary | ICD-10-CM

## 2019-06-28 DIAGNOSIS — M25561 Pain in right knee: Secondary | ICD-10-CM

## 2019-06-28 NOTE — Patient Instructions (Signed)
Chronic Knee Pain, Adult Chronic knee pain is pain in one or both knees that lasts longer than 3 months. Symptoms of chronic knee pain may include swelling, stiffness, and discomfort. Age-related wear and tear (osteoarthritis) of the knee joint is the most common cause of chronic knee pain. Other possible causes include:  A long-term immune-related disease that causes inflammation of the knee (rheumatoid arthritis). This usually affects both knees.  Inflammatory arthritis, such as gout or pseudogout.  An injury to the knee that causes arthritis.  An injury to the knee that damages the ligaments. Ligaments are strong tissues that connect bones to each other.  Runner's knee or pain behind the kneecap. Treatment for chronic knee pain depends on the cause. The main treatments for chronic knee pain are physical therapy and weight loss. This condition may also be treated with medicines, injections, a knee sleeve or brace, and by using crutches. Rest, ice, compression (pressure), and elevation (RICE) therapy may also be recommended. Follow these instructions at home: If you have a knee sleeve or brace:   Wear it as told by your health care provider. Remove it only as told by your health care provider.  Loosen it if your toes tingle, become numb, or turn cold and blue.  Keep it clean.  If the sleeve or brace is not waterproof: ? Do not let it get wet. ? Remove it if allowed by your health care provider, or cover it with a watertight covering when you take a bath or a shower. Managing pain, stiffness, and swelling      If directed, apply heat to the affected area as often as told by your health care provider. Use the heat source that your health care provider recommends, such as a moist heat pack or a heating pad. ? If you have a removable sleeve or brace, remove it as told by your health care provider. ? Place a towel between your skin and the heat source. ? Leave the heat on for 20-30  minutes. ? Remove the heat if your skin turns bright red. This is especially important if you are unable to feel pain, heat, or cold. You may have a greater risk of getting burned.  If directed, put ice on the affected area. ? If you have a removable sleeve or brace, remove it as told by your health care provider. ? Put ice in a plastic bag. ? Place a towel between your skin and the bag. ? Leave the ice on for 20 minutes, 2-3 times a day.  Move your toes often to reduce stiffness and swelling.  Raise (elevate) the injured area above the level of your heart while you are sitting or lying down. Activity  Avoid activities where both feet leave the ground at the same time (high-impact activities). Examples are running, jumping rope, and doing jumping jacks.  Return to your normal activities as told by your health care provider. Ask your health care provider what activities are safe for you.  Follow the exercise plan that your health care provider designed for you. Your health care provider may suggest that you: ? Avoid activities that make knee pain worse. This may require you to change your exercise routines, sport participation, or job duties. ? Wear shoes with cushioned soles. ? Avoid high-impact activities or sports that require running and sudden changes in direction. ? Do physical therapy as told by your health care provider. Physical therapy is planned to match your needs and abilities. It may include  exercises for strength, flexibility, stability, and endurance. ? Do exercises that increase balance and strength, such as tai chi and yoga.  Do not use the injured limb to support your body weight until your health care provider says that you can. Use crutches, a cane, or a walker, as told by your health care provider. General instructions  Take over-the-counter and prescription medicines only as told by your health care provider.  Lose weight if you are overweight. Losing even a little  weight can reduce knee pain. Ask your health care provider what your ideal weight is, and how to safely lose extra weight. A food expert (dietitian) may be able to help you plan your meals.  Do not use any products that contain nicotine or tobacco, such as cigarettes, e-cigarettes, and chewing tobacco. These can delay healing. If you need help quitting, ask your health care provider.  Keep all follow-up visits as told by your health care provider. This is important. Contact a health care provider if:  You have knee pain that is not getting better or gets worse.  You are unable to do your physical therapy exercises due to knee pain. Get help right away if:  Your knee swells and the swelling becomes worse.  You cannot move your knee.  You have severe knee pain. Summary  Knee pain that lasts more than 3 months is considered chronic knee pain.  The main treatments for chronic knee pain are physical therapy and weight loss. You may also need to take medicines, wear a knee sleeve or brace, use crutches, and apply ice or heat.  Losing even a little weight can reduce knee pain. Ask your health care provider what your ideal weight is, and how to safely lose extra weight. A food expert (dietitian) may be able to help you plan your meals.  Work with a physical therapist to make a safe exercise program, as told by your health care provider. This information is not intended to replace advice given to you by your health care provider. Make sure you discuss any questions you have with your health care provider. Document Released: 01/12/2019 Document Revised: 01/12/2019 Document Reviewed: 01/12/2019 Elsevier Patient Education  2020 [employer].

## 2019-06-28 NOTE — Progress Notes (Signed)
Brian Mullen  06/28/2019  HISTORY SECTION :  Chief Complaint  Patient presents with  . Knee Pain    Left knee pain, referred by Dr. Lovena Le.   HPI The patient presents for evaluation of lateral knee pain left greater than right.  Left knee pain for about 3 to 5 months right knee pain x1 day  Patient says his knee feels like it separates when he is walking.  His symptoms are intermittent not getting better after trying electrical stimulation ice and over-the-counter Aleve.  This is associated with pain popping and clicking  Review of Systems  HENT: Positive for tinnitus.   Respiratory: Positive for shortness of breath and wheezing.   Cardiovascular: Positive for palpitations and claudication.  Musculoskeletal: Positive for back pain, joint pain and neck pain.  Neurological: Positive for dizziness and tingling.  All other systems reviewed and are negative.    Past Medical History:  Diagnosis Date  . Allergy   . Asthma   . Diabetes mellitus without complication ([place])   . GERD (gastroesophageal reflux disease)   . Hypertension     Past Surgical History:  Procedure Laterality Date  . ELBOW SURGERY    . KNEE SURGERY Right   . TONSILLECTOMY       Allergies  Allergen Reactions  . Coconut Oil Anaphylaxis  . Iodine Anaphylaxis  . Shellfish Allergy Anaphylaxis  . Shellfish-Derived Products Anaphylaxis     Current Outpatient Medications:  .  ACCU-CHEK AVIVA PLUS test strip, USE AS INSTRUCTED TO TEST BLOOD SUGAR 2 TIMES DAILY, Disp: 100 strip, Rfl: 2 .  aspirin 81 MG tablet, Take 81 mg by mouth daily., Disp: , Rfl:  .  Blood Glucose Monitoring Suppl (ONE TOUCH ULTRA 2) w/Device KIT, Use as instructed to blood sugar 2 times daily., Disp: 1 each, Rfl: 0 .  fexofenadine (ALLEGRA) 180 MG tablet, Take 180 mg by mouth daily., Disp: , Rfl:  .  Lancets (ONETOUCH ULTRASOFT) lancets, Use as instructed to test blood sugar 2 times daily., Disp: 100 each, Rfl: 5 .  losartan (COZAAR) 25  MG tablet, Take 1 tablet (25 mg total) by mouth daily., Disp: 90 tablet, Rfl: 1 .  metFORMIN (GLUCOPHAGE) 500 MG tablet, TAKE 2 TABLETS (1,000 MG TOTAL) BY MOUTH 2 (TWO) TIMES DAILY WITH A MEAL., Disp: 360 tablet, Rfl: 1 .  metoprolol succinate (TOPROL-XL) 25 MG 24 hr tablet, TAKE 1 TABLET BY MOUTH EVERY DAY FOR HIGH BLOOD PRESSURE, Disp: 90 tablet, Rfl: 1 .  montelukast (SINGULAIR) 10 MG tablet, TAKE 1 TABLET (10 MG TOTAL) BY MOUTH AT BEDTIME., Disp: 90 tablet, Rfl: 1 .  sildenafil (REVATIO) 20 MG tablet, Take 2-5 tablets by mouth 30 minutes prior to intercourse., Disp: 50 tablet, Rfl: 0 .  simvastatin (ZOCOR) 10 MG tablet, TAKE 1 TABLET(10 MG) BY MOUTH AT BEDTIME, Disp: 90 tablet, Rfl: 1 .  tiotropium (SPIRIVA) 18 MCG inhalation capsule, Place 1 capsule (18 mcg total) into inhaler and inhale daily., Disp: 30 capsule, Rfl: 3 .  VENTOLIN HFA 108 (90 Base) MCG/ACT inhaler, TAKE 2 PUFFS BY MOUTH EVERY 6 HOURS AS NEEDED FOR WHEEZE OR SHORTNESS OF BREATH, Disp: 18 Inhaler, Rfl: 3   PHYSICAL EXAM SECTION: 1) BP 110/61   Pulse 79   Ht 5' 9"  (1.753 m)   Wt 167 lb (75.8 kg)   BMI 24.66 kg/m   Body mass index is 24.66 kg/m. General appearance: Well-developed well-nourished no gross deformities  2) Cardiovascular normal pulse and perfusion in all 4  extremities normal color without edema  3) Neurologically deep tendon reflexes are equal and normal, no sensation loss or deficits no pathologic reflexes  4) Psychological: Awake alert and oriented x3 mood and affect normal  5) Skin no lacerations or ulcerations no nodularity no palpable masses, no erythema or nodularity  6) Musculoskeletal:  Left knee 5 degree flexion contracture flexion up to 115 tenderness medial joint line no instability normal muscle tone and strength  Right knee 10 degree flexion contracture flexion up to 115 tenderness medial joint line no instability muscle strength and tone normal  MEDICAL DECISION SECTION:  Encounter  Diagnoses  Name Primary?  . Chronic pain of left knee Yes  . Chronic pain of right knee     Imaging Please see the dictated report but he has bilateral knee osteoarthritis moderate to severe worse on the left and mild to moderate varus deformities   Plan:  (Rx., Inj., surg., Frx, MRI/CT, XR:2) He does not want to have surgery so we decided to go with bilateral economy hinged braces  We will see him on an as-needed basis depending on his symptoms   2:53 PM Arther Abbott, MD  06/28/2019

## 2019-07-04 ENCOUNTER — Telehealth: Payer: Self-pay | Admitting: Orthopedic Surgery

## 2019-07-04 NOTE — Telephone Encounter (Signed)
[facility] representative Linton Rump came to our clinic today, and will contact patient.

## 2019-07-04 NOTE — Telephone Encounter (Signed)
Patient called to relay that the bilateral economy hinged braces are not fitting well; said they are "covering the knee cap", however, they are helping. Asking if he may need a different size?  Relayed to patient that Dr Aline Brochure and clinical staff are out of clinic this week. Will attempt to contact [facility] representative in the meantime, in event he may be able to assist patient. Patient ph# [phone]

## 2019-07-28 NOTE — Telephone Encounter (Signed)
Unfortunately he called when I was on vacation I thought this was taken care of, sorry  He will come by Tuesday am around 930 to see me with the braces, so I can look at them for him

## 2019-07-28 NOTE — Telephone Encounter (Signed)
UPDATE-- Brian Mullen called back this morning stating that he hasnt heard from anyone on what to do regarding the braces that dont fit him.  I told him that I would send the message and have someone call him.  Would you give him please?  Thank you so much for your help!

## 2019-08-18 ENCOUNTER — Other Ambulatory Visit: Payer: Self-pay

## 2019-08-18 ENCOUNTER — Emergency Department (HOSPITAL_COMMUNITY): Payer: Medicare HMO

## 2019-08-18 ENCOUNTER — Encounter (HOSPITAL_COMMUNITY): Payer: Self-pay | Admitting: Emergency Medicine

## 2019-08-18 ENCOUNTER — Telehealth: Payer: Self-pay

## 2019-08-18 ENCOUNTER — Emergency Department (HOSPITAL_COMMUNITY)
Admission: EM | Admit: 2019-08-18 | Discharge: 2019-08-18 | Disposition: A | Discharge status: Home / Self Care | Payer: Medicare HMO | Attending: Emergency Medicine | Admitting: Emergency Medicine

## 2019-08-18 DIAGNOSIS — Z87891 Personal history of nicotine dependence: Secondary | ICD-10-CM | POA: Diagnosis not present

## 2019-08-18 DIAGNOSIS — I10 Essential (primary) hypertension: Secondary | ICD-10-CM | POA: Diagnosis not present

## 2019-08-18 DIAGNOSIS — R0602 Shortness of breath: Secondary | ICD-10-CM | POA: Diagnosis not present

## 2019-08-18 DIAGNOSIS — Z7982 Long term (current) use of aspirin: Secondary | ICD-10-CM | POA: Insufficient documentation

## 2019-08-18 DIAGNOSIS — J45909 Unspecified asthma, uncomplicated: Secondary | ICD-10-CM | POA: Diagnosis not present

## 2019-08-18 DIAGNOSIS — Z7984 Long term (current) use of oral hypoglycemic drugs: Secondary | ICD-10-CM | POA: Insufficient documentation

## 2019-08-18 DIAGNOSIS — Z79899 Other long term (current) drug therapy: Secondary | ICD-10-CM | POA: Diagnosis not present

## 2019-08-18 DIAGNOSIS — E119 Type 2 diabetes mellitus without complications: Secondary | ICD-10-CM | POA: Diagnosis not present

## 2019-08-18 DIAGNOSIS — Z20828 Contact with and (suspected) exposure to other viral communicable diseases: Secondary | ICD-10-CM | POA: Insufficient documentation

## 2019-08-18 LAB — CBC WITH DIFFERENTIAL/PLATELET
Abs Immature Granulocytes: 0.03 10*3/uL (ref 0.00–0.07)
Basophils Absolute: 0 10*3/uL (ref 0.0–0.1)
Basophils Relative: 1 %
Eosinophils Absolute: 0.3 10*3/uL (ref 0.0–0.5)
Eosinophils Relative: 3 %
HCT: 43.3 % (ref 39.0–52.0)
Hemoglobin: 13.8 g/dL (ref 13.0–17.0)
Immature Granulocytes: 0 %
Lymphocytes Relative: 22 %
Lymphs Abs: 1.8 10*3/uL (ref 0.7–4.0)
MCH: 29.4 pg (ref 26.0–34.0)
MCHC: 31.9 g/dL (ref 30.0–36.0)
MCV: 92.3 fL (ref 80.0–100.0)
Monocytes Absolute: 0.7 10*3/uL (ref 0.1–1.0)
Monocytes Relative: 8 %
Neutro Abs: 5.5 10*3/uL (ref 1.7–7.7)
Neutrophils Relative %: 66 %
Platelets: 297 10*3/uL (ref 150–400)
RBC: 4.69 MIL/uL (ref 4.22–5.81)
RDW: 12.8 % (ref 11.5–15.5)
WBC: 8.3 10*3/uL (ref 4.0–10.5)
nRBC: 0 % (ref 0.0–0.2)

## 2019-08-18 LAB — BASIC METABOLIC PANEL
Anion gap: 9 (ref 5–15)
BUN: 8 mg/dL (ref 8–23)
CO2: 28 mmol/L (ref 22–32)
Calcium: 8.7 mg/dL — ABNORMAL LOW (ref 8.9–10.3)
Chloride: 100 mmol/L (ref 98–111)
Creatinine, Ser: 0.73 mg/dL (ref 0.61–1.24)
GFR calc Af Amer: >60 mL/min (ref >60)
GFR calc non Af Amer: >60 mL/min (ref >60)
Glucose, Bld: 119 mg/dL — ABNORMAL HIGH (ref 70–99)
Potassium: 3.8 mmol/L (ref 3.5–5.1)
Sodium: 137 mmol/L (ref 135–145)

## 2019-08-18 LAB — CBG MONITORING, ED: Glucose-Capillary: 114 mg/dL — ABNORMAL HIGH (ref 70–99)

## 2019-08-18 NOTE — Telephone Encounter (Signed)
[facility] Night - Client TELEPHONE ADVICE RECORD Access[facility]urse Patient Name: Brian Mullen Gender: Male DOB: 1946/02/27 Age: 73 Y 64 M 19 D Return Phone Number: [phone] (Primary) Address: [place] [address] Client [facility] Night - Client Client Site [facility] Physician Alma Friendly - NP Contact Type Call Who Is Calling Patient / Member / Family / Caregiver Call Type Triage / Clinical Relationship To Patient Self Return Phone Number [phone] (Primary) Chief Complaint BREATHING - shortness of breath or sounds breathless Reason for Call Symptomatic / Request for [place] states he is needing to make an appt. States having SOB when he gets up from sitting. He does have asthma. Heart rate got up to 98 and he is just walking. In the morning after he drinks coffee he gets a weird feeling left lung. Has concerns about pneumonia. Translation No Nurse Assessment Nurse: Claiborne Billings, RN, Kim Date/Time (Eastern Time): 08/17/2019 5:28:01 PM Confirm and document reason for call. If symptomatic, describe symptoms. ---Caller states he has asthma but usually only gets short of breath with strenous activity. States for the past 2 to 3 days however, he has had new onset shortness of breath when getting up from his chair, walking across the room, or other normal activities. States he has been using his rescue inhaler but it doesn't seem to help. Has the patient had close contact with a person known or suspected to have the novel coronavirus illness OR traveled / lives in area with major community spread (including international travel) in the last 14 days from the onset of symptoms? * If Asymptomatic, screen for exposure and travel within the last 14 days. ---No Does the patient have any new or worsening symptoms? ---Yes Will a triage be completed?  ---Yes Related visit to physician within the last 2 weeks? ---No Does the PT have any chronic conditions? (i.e. diabetes, asthma, this includes High risk factors for pregnancy, etc.) ---Yes List chronic conditions. ---Asthma, Palpitations Is this a behavioral health or substance abuse call? ---No PLEASE NOTE: All timestamps contained within this report are represented as [place] Standard Time. CONFIDENTIALTY NOTICE: This fax transmission is intended only for the addressee. It contains information that is legally privileged, confidential or otherwise protected from use or disclosure. If you are not the intended recipient, you are strictly prohibited from reviewing, disclosing, copying using or disseminating any of this information or taking any action in reliance on or regarding this information. If you have received this fax in error, please notify us immediately by telephone so that we can arrange for its return to [place]. Phone: [phone], Toll-Free: [phone], Fax: [phone] Page: 2 of 2 Call Id: [id number] Guidelines Guideline Title Affirmed Question Affirmed Notes Nurse Date/Time Eilene Ghazi Time) Asthma Attack [1] MILD asthma attack (e.g., no SOB at rest, mild SOB with walking, speaks normally in sentences, mild wheezing) AND [2] persists > 24 hours on appropriate treatment Suzette Battiest 08/17/2019 5:32:27 PM Disp. Time Eilene Ghazi Time) Disposition Final User 08/17/2019 5:26:01 PM Send to Urgent [place], [place] 08/17/2019 5:36:50 PM See PCP within 24 Hours Yes Claiborne Billings, RN, Max Sane Disagree/Comply Comply Caller Understands Yes PreDisposition [place] Advice Given Per Guideline SEE PCP WITHIN 24 HOURS: * IF OFFICE WILL BE OPEN: You need to be seen within the next 24 hours. Call your doctor (or NP/PA) when the office opens and make an appointment. ASTHMA QUICK-RELIEF MEDICINE: * Start your quick-relief  medicine (e.g., albuterol, salbutamol) at the first sign of any  coughing or shortness of breath (don't wait for wheezing). DRINKING FLUIDS AND USING A HUMIDIFIER: * Drink a normal amount of liquids (e.g., water). Being adequately hydrated makes it easier to cough up the sticky lung mucus. CALL BACK IF: * Wheezing is not improved after neb or inhaler * Inhaled asthma medicine (neb or MDI) is needed more often than every 4 hours * You become worse. CARE ADVICE given per Asthma Attack (Adult) guideline. Referrals REFERRED TO PCP OFFICE

## 2019-08-18 NOTE — Telephone Encounter (Signed)
pts cell will not ring; home phone no answer and v/m not set up and wifes cell is not accepting calls. I will try again. Finally pts cell did ring and left v/m for pt to call [place].

## 2019-08-18 NOTE — ED Provider Notes (Signed)
[facility] EMERGENCY DEPARTMENT Provider Note   CSN: [id number] Arrival date & time: 08/18/19  6578     History   Chief Complaint Chief Complaint  Patient presents with  . Shortness of Breath    HPI Brian Mullen is a 73 y.o. male with a history of asthma, seasonal allergies, diabetes, GERD and hypertension presenting with a 2 to 3-day history of increased dry throat but denies increased thirst or dysuria or urinary frequency, reporting episodic shortness of breath without wheezing, chest pain, cough or fevers.  He describes walking 3 to 4 feet, having a brief episode of feeling short of breath, will rest for a few seconds and then is able to continue without further symptoms.  These episodes are not accompanied by chest pain or wheezing.  He also feels that this team or perhaps the heat from his first 1 to 2 cups of coffee in the morning "opens his lungs", almost as if his lungs dry out overnight and improve with his morning coffee.  He denies fevers or chills, occasional dry cough which is not new, denies any known exposures to COVID 19.  He discussed his symptoms with his PCP over the phone this morning and was advised to report here for COVID testing.  Symptoms do not include chest pain, palpitations, nausea, vomiting, diaphoresis.  He also denies peripheral edema or pain.  He is normally fairly active working in his yard, but is taken the last 3 days off secondary to the weather.     The history is provided by the patient.    Past Medical History:  Diagnosis Date  . Allergy   . Asthma   . Diabetes mellitus without complication ([place])   . GERD (gastroesophageal reflux disease)   . Hypertension     Patient Active Problem List   Diagnosis Date Noted  . Tinnitus of both ears 03/08/2019  . Testosterone deficiency 07/15/2018  . Syncope and collapse 02/11/2018  . Preventative health care 12/11/2016  . Left shoulder pain 01/21/2016  . Medicare annual wellness visit, subsequent  10/07/2015  . Insomnia 10/07/2015  . Asthma 03/14/2015  . Type 2 diabetes mellitus ([place]) 10/29/2014  . Pure hypercholesterolemia 10/29/2014  . Lumbar radiculopathy, chronic 09/22/2013  . Essential hypertension 08/18/2013    Past Surgical History:  Procedure Laterality Date  . ELBOW SURGERY    . KNEE SURGERY Right   . TONSILLECTOMY          Home Medications    Prior to Admission medications   Medication Sig Start Date End Date Taking? Authorizing Provider  ACCU-CHEK AVIVA PLUS test strip USE AS INSTRUCTED TO TEST BLOOD SUGAR 2 TIMES DAILY 05/22/19   Pleas Koch, NP  aspirin 81 MG tablet Take 81 mg by mouth daily.    [provider]  Blood Glucose Monitoring Suppl (ONE TOUCH ULTRA 2) w/Device KIT Use as instructed to blood sugar 2 times daily. 04/20/17   Pleas Koch, NP  fexofenadine (ALLEGRA) 180 MG tablet Take 180 mg by mouth daily.    [provider]  Lancets [facility] ULTRASOFT) lancets Use as instructed to test blood sugar 2 times daily. 04/20/17   Pleas Koch, NP  losartan (COZAAR) 25 MG tablet Take 1 tablet (25 mg total) by mouth daily. 03/13/19   Pleas Koch, NP  metFORMIN (GLUCOPHAGE) 500 MG tablet TAKE 2 TABLETS (1,000 MG TOTAL) BY MOUTH 2 (TWO) TIMES DAILY WITH A MEAL. 01/31/19   Pleas Koch, NP  metoprolol  succinate (TOPROL-XL) 25 MG 24 hr tablet TAKE 1 TABLET BY MOUTH EVERY DAY FOR HIGH BLOOD PRESSURE 06/13/19   Pleas Koch, NP  montelukast (SINGULAIR) 10 MG tablet TAKE 1 TABLET (10 MG TOTAL) BY MOUTH AT BEDTIME. 04/04/19   Pleas Koch, NP  sildenafil (REVATIO) 20 MG tablet Take 2-5 tablets by mouth 30 minutes prior to intercourse. 07/13/18   Pleas Koch, NP  simvastatin (ZOCOR) 10 MG tablet TAKE 1 TABLET(10 MG) BY MOUTH AT BEDTIME 05/15/19   Pleas Koch, NP  tiotropium (SPIRIVA) 18 MCG inhalation capsule Place 1 capsule (18 mcg total) into inhaler and inhale daily. 10/29/14   Kennyth Arnold, FNP   VENTOLIN HFA 108 (90 Base) MCG/ACT inhaler TAKE 2 PUFFS BY MOUTH EVERY 6 HOURS AS NEEDED FOR WHEEZE OR SHORTNESS OF BREATH 11/28/18   Pleas Koch, NP    Family History Family History  Problem Relation Age of Onset  . Pulmonary embolism Father 33       Deceased  . Heart attack Mother 35       Deceased  . Diabetes Maternal Grandmother   . Heart attack Sister     Social History Social History   Tobacco Use  . Smoking status: Former Research scientist (life sciences)  . Smokeless tobacco: Never Used  Substance Use Topics  . Alcohol use: Yes    Alcohol/week: 1.0 - 2.0 standard drinks    Types: 1 - 2 Standard drinks or equivalent per week    Comment: occ  . Drug use: No     Allergies   Coconut oil, Iodine, Shellfish allergy, and Shellfish-derived products   Review of Systems Review of Systems  Constitutional: Negative for diaphoresis and fever.  HENT: Negative for congestion and sore throat.   Eyes: Negative.   Respiratory: Positive for cough and shortness of breath. Negative for chest tightness, wheezing and stridor.   Cardiovascular: Negative for chest pain, palpitations and leg swelling.  Gastrointestinal: Negative for abdominal pain, nausea and vomiting.  Genitourinary: Negative.   Musculoskeletal: Negative for arthralgias, joint swelling and neck pain.  Skin: Negative.  Negative for rash and wound.  Neurological: Negative for dizziness, weakness, light-headedness, numbness and headaches.  Psychiatric/Behavioral: Negative.      Physical Exam Updated Vital Signs BP 129/69   Pulse 63   Temp 97.7 F (36.5 C) (Oral)   Resp 12   Ht 5' 9"  (1.753 m)   Wt 77.1 kg   SpO2 98%   BMI 25.10 kg/m   Physical Exam Vitals signs and nursing note reviewed.  Constitutional:      Appearance: He is well-developed.  HENT:     Head: Normocephalic and atraumatic.     Mouth/Throat:     Mouth: Mucous membranes are moist.     Pharynx: Oropharynx is clear.  Eyes:     Conjunctiva/sclera:  Conjunctivae normal.  Neck:     Musculoskeletal: Normal range of motion.  Cardiovascular:     Rate and Rhythm: Normal rate and regular rhythm.     Heart sounds: Normal heart sounds.  Pulmonary:     Effort: Pulmonary effort is normal.     Breath sounds: Normal breath sounds. No decreased breath sounds, wheezing or rhonchi.  Abdominal:     General: Bowel sounds are normal.     Palpations: Abdomen is soft.     Tenderness: There is no abdominal tenderness.  Musculoskeletal: Normal range of motion.     Right lower leg: He exhibits no tenderness. No edema.  Left lower leg: He exhibits no tenderness. No edema.  Skin:    General: Skin is warm and dry.  Neurological:     Mental Status: He is alert.      ED Treatments / Results  Labs (all labs ordered are listed, but only abnormal results are displayed) Labs Reviewed  BASIC METABOLIC PANEL - Abnormal; Notable for the following components:      Result Value   Glucose, Bld 119 (*)    Calcium 8.7 (*)    All other components within normal limits  CBG MONITORING, ED - Abnormal; Notable for the following components:   Glucose-Capillary 114 (*)    All other components within normal limits  NOVEL CORONAVIRUS, NAA (HOSP ORDER, SEND-OUT TO REF LAB; TAT 18-24 HRS)  CBC WITH DIFFERENTIAL/PLATELET    EKG EKG Interpretation  Date/Time:  Friday August 18 2019 10:10:17 EDT Ventricular Rate:  69 PR Interval:    QRS Duration: 96 QT Interval:  390 QTC Calculation: 418 R Axis:   69 Text Interpretation:  Sinus rhythm Baseline wander in lead(s) V3 slower than prior 10/18 Confirmed by Aletta Edouard [id number] on 08/18/2019 10:26:58 AM   Radiology Dg Chest Portable 1 View  Result Date: 08/18/2019 CLINICAL DATA:  Shortness of breath EXAM: PORTABLE CHEST 1 VIEW COMPARISON:  09/15/2017 FINDINGS: The heart size and mediastinal contours are within normal limits. Both lungs are clear. The visualized skeletal structures are unremarkable. IMPRESSION:  No acute cardiopulmonary findings. Electronically Signed   By: Davina Poke M.D.   On: 08/18/2019 11:42    Procedures Procedures (including critical care time)  Medications Ordered in ED Medications - No data to display   Initial Impression / Assessment and Plan / ED Course  I have reviewed the triage vital signs and the nursing notes.  Pertinent labs & imaging results that were available during my care of the patient were reviewed by me and considered in my medical decision making (see chart for details).  Clinical Course as of Aug 17 1233  Fri Oct 02, 337  246 73 year old male with history of asthma here with 4 to 5 days of intermittent shortness of breath mostly in the morning when he gets up.  He says he feels like his lung on the left opens up after he has a cup of tea and then things are better.  He also notices it when he is walking he will sometimes have to just stop and get a quick deep breath.  No pain no fever.  Equal breath sounds clear to auscultation.  Getting chest x-ray and some screening labs.  Likely home with outpatient follow-up.   [MB]    Clinical Course User Index [MB] Hayden Rasmussen, MD       Pt with subtle fleeting episodes of sob, normal exam and VS. No cp. Episodes of sob are fleeting.  No fever, doubt covid, although pt was screened for this.  Plan prn f/u with pcp if sx persist, return precautions discussed.  Advised isolation until covid tests result.  Brian Mullen was evaluated in Emergency Department on 08/18/2019 for the symptoms described in the history of present illness. He was evaluated in the context of the global COVID-19 pandemic, which necessitated consideration that the patient might be at risk for infection with the SARS-CoV-2 virus that causes COVID-19. Institutional protocols and algorithms that pertain to the evaluation of patients at risk for COVID-19 are in a state of rapid change based on information released by regulatory bodies  including the CDC and federal and state organizations. These policies and algorithms were followed during the patient's care in the ED.   Final Clinical Impressions(s) / ED Diagnoses   Final diagnoses:  Shortness of breath    ED Discharge Orders    None       Landis Martins 08/18/19 1236    Hayden Rasmussen, MD 08/18/19 [phone]

## 2019-08-18 NOTE — Telephone Encounter (Signed)
Noted  

## 2019-08-18 NOTE — Discharge Instructions (Addendum)
Your lab tests and chest xray today are normal, although your Covid test is currently pending and should be resulted within the next 1-2 days.  You will be notified if this test is positive.  Your symptoms do not really suggest that you have Covid, but to err on the side of caution, I recommend home isolation until you have this suspected negative test result.

## 2019-08-18 NOTE — ED Triage Notes (Signed)
Pt with hx of asthma sent over by PCP for evaluation. Pt c/o dry throat and when he wakes up in the morning and drinks his coffee, he states "I feel like the steam is going into my lungs and opening up my lungs." Pt reports SOB intermittently.

## 2019-08-18 NOTE — Telephone Encounter (Addendum)
I spoke with pt for 2-3 days pt has been SOB upon exertion; pt also having heart skipping and when drinks tea first thing in morning lt lung "feels dry". Pt has occasional H/A. Pt has not traveled and no known exposure to covid pt. Pt will go to UC in [place] or [facility] this morning.FYI to Glenda Chroman FNP since Gentry Fitz NP is out of office.

## 2019-08-19 LAB — NOVEL CORONAVIRUS, NAA (HOSP ORDER, SEND-OUT TO REF LAB; TAT 18-24 HRS): SARS-CoV-2, NAA: NOT DETECTED

## 2019-09-03 ENCOUNTER — Other Ambulatory Visit: Payer: Self-pay | Admitting: Primary Care

## 2019-09-06 DIAGNOSIS — M545 Low back pain: Secondary | ICD-10-CM | POA: Diagnosis not present

## 2019-09-06 DIAGNOSIS — M9903 Segmental and somatic dysfunction of lumbar region: Secondary | ICD-10-CM | POA: Diagnosis not present

## 2019-09-06 DIAGNOSIS — M542 Cervicalgia: Secondary | ICD-10-CM | POA: Diagnosis not present

## 2019-09-06 DIAGNOSIS — M546 Pain in thoracic spine: Secondary | ICD-10-CM | POA: Diagnosis not present

## 2019-09-06 DIAGNOSIS — M9902 Segmental and somatic dysfunction of thoracic region: Secondary | ICD-10-CM | POA: Diagnosis not present

## 2019-09-06 DIAGNOSIS — M9901 Segmental and somatic dysfunction of cervical region: Secondary | ICD-10-CM | POA: Diagnosis not present

## 2019-09-08 ENCOUNTER — Ambulatory Visit (INDEPENDENT_AMBULATORY_CARE_PROVIDER_SITE_OTHER): Payer: Medicare HMO | Admitting: Primary Care

## 2019-09-08 ENCOUNTER — Encounter: Payer: Self-pay | Admitting: Primary Care

## 2019-09-08 ENCOUNTER — Other Ambulatory Visit: Payer: Self-pay

## 2019-09-08 VITALS — BP 122/74 | HR 80 | Temp 97.4°F | Ht 68.0 in | Wt 176.2 lb

## 2019-09-08 DIAGNOSIS — Z23 Encounter for immunization: Secondary | ICD-10-CM

## 2019-09-08 DIAGNOSIS — M9901 Segmental and somatic dysfunction of cervical region: Secondary | ICD-10-CM | POA: Diagnosis not present

## 2019-09-08 DIAGNOSIS — R002 Palpitations: Secondary | ICD-10-CM | POA: Insufficient documentation

## 2019-09-08 DIAGNOSIS — M546 Pain in thoracic spine: Secondary | ICD-10-CM | POA: Diagnosis not present

## 2019-09-08 DIAGNOSIS — M545 Low back pain: Secondary | ICD-10-CM | POA: Diagnosis not present

## 2019-09-08 DIAGNOSIS — M9903 Segmental and somatic dysfunction of lumbar region: Secondary | ICD-10-CM | POA: Diagnosis not present

## 2019-09-08 DIAGNOSIS — E119 Type 2 diabetes mellitus without complications: Secondary | ICD-10-CM

## 2019-09-08 DIAGNOSIS — M9902 Segmental and somatic dysfunction of thoracic region: Secondary | ICD-10-CM | POA: Diagnosis not present

## 2019-09-08 DIAGNOSIS — M542 Cervicalgia: Secondary | ICD-10-CM | POA: Diagnosis not present

## 2019-09-08 LAB — POCT GLYCOSYLATED HEMOGLOBIN (HGB A1C): Hemoglobin A1C: 6.5 % — AB (ref 4.0–5.6)

## 2019-09-08 NOTE — Assessment & Plan Note (Signed)
A1C today of 6.5 which is well controlled given age.  Managed on statin and ARB. Pneumonia vaccination provided today. Foot exam today. Eye exam UTD.  Continue Metformin. Follow up in 6 months.

## 2019-09-08 NOTE — Addendum Note (Signed)
Addended by: Jacqualin Combes on: 09/08/2019 03:09 PM   Modules accepted: Orders

## 2019-09-08 NOTE — Patient Instructions (Addendum)
You will be contacted regarding your referral to Cardiology.  Please let us know if you have not been contacted within one week.   Continue Metformin for diabetes.  Continue to work on a healthy diet and get regular exercise.  Please schedule a follow up appointment in 6 months for your physical.   It was a pleasure to see you today!

## 2019-09-08 NOTE — Progress Notes (Signed)
Subjective:    Patient ID: Brian Mullen, male    DOB: 1946/10/02, 73 y.o.   MRN: [id number]  HPI  Brian Mullen is a 73 year old male who presents today for follow up of diabetes.  1) Type 2 Diabetes: Current medications include: Metformin 500 mg BID. He misses his morning dose 2-3 times weekly as he doesn't eat breakfast.  He is checking his blood glucose infrequently when feeling "crummy"and is getting readings of 114-120.  Lowest reading: 58 several weeks ago. Highest reading: 264, just after cake  Last A1C: 6.7 in April 2020 Last Eye Exam: Due in February 2021 Last Foot Exam: Due today Pneumonia Vaccination: Completed last in 2015 ACE/ARB: Losartan  Statin: Simvastatin   BP Readings from Last 3 Encounters:  09/08/19 122/74  08/18/19 133/64  06/28/19 110/61   2) Palpitations: Intermittent for the last two years. He will occasionally wake up from sleep feeling palpitations, checks his HR and will notice readings ranging 90-120's. He will typically drink water and rest and his symptoms will resolve within a few minutes. She will also lay flat and stretch out his body with resolve. His most recent episode was  He was evaluated at [place] on 09/15/17 for similar symptoms, work up benign. He was then evaluated by cardiology soon after and was told that his symptoms may have been secondary to dehydration.   He drinks decaffeinated drinks mostly, occasional caffeinated coffee and soda. He has two alcoholic drinks weekly for the most part. He is compliant to his metoprolol succinate. Denies chest pain, dizziness, shortness of breath.   He was recently evaluated at [place] for shortness of breath, ECG completed including labs which were overall benign. He has not seen cardiology recently.   Review of Systems  Eyes: Negative for visual disturbance.  Respiratory: Negative for shortness of breath.   Cardiovascular: Positive for palpitations. Negative for chest pain.  Neurological: Negative for  dizziness, numbness and headaches.       Past Medical History:  Diagnosis Date  . Allergy   . Asthma   . Diabetes mellitus without complication ([place])   . GERD (gastroesophageal reflux disease)   . Hypertension      Social History   Socioeconomic History  . Marital status: Married    Spouse name: Not on file  . Number of children: Not on file  . Years of education: Not on file  . Highest education level: Not on file  Occupational History  . Not on file  Social Needs  . Financial resource strain: Not on file  . Food insecurity    Worry: Not on file    Inability: Not on file  . Transportation needs    Medical: Not on file    Non-medical: Not on file  Tobacco Use  . Smoking status: Former Research scientist (life sciences)  . Smokeless tobacco: Never Used  Substance and Sexual Activity  . Alcohol use: Yes    Alcohol/week: 1.0 - 2.0 standard drinks    Types: 1 - 2 Standard drinks or equivalent per week    Comment: occ  . Drug use: No  . Sexual activity: Not on file  Lifestyle  . Physical activity    Days per week: Not on file    Minutes per session: Not on file  . Stress: Not on file  Relationships  . Social Herbalist on phone: Not on file    Gets together: Not on file    Attends religious service: Not  on file    Active member of club or organization: Not on file    Attends meetings of clubs or organizations: Not on file    Relationship status: Not on file  . Intimate partner violence    Fear of current or ex partner: Not on file    Emotionally abused: Not on file    Physically abused: Not on file    Forced sexual activity: Not on file  Other Topics Concern  . Not on file  Social History Narrative   Married.   No children   Retried. Worked as a Furniture conservator/restorer.   Enjoys working on [employer], yard work, gardening.    Past Surgical History:  Procedure Laterality Date  . ELBOW SURGERY    . KNEE SURGERY Right   . TONSILLECTOMY      Family History  Problem  Relation Age of Onset  . Pulmonary embolism Father 44       Deceased  . Heart attack Mother 32       Deceased  . Diabetes Maternal Grandmother   . Heart attack Sister     Allergies  Allergen Reactions  . Coconut Oil Anaphylaxis  . Iodine Anaphylaxis  . Shellfish Allergy Anaphylaxis  . Shellfish-Derived Products Anaphylaxis    Current Outpatient Medications on File Prior to Visit  Medication Sig Dispense Refill  . ACCU-CHEK AVIVA PLUS test strip USE AS INSTRUCTED TO TEST BLOOD SUGAR 2 TIMES DAILY 100 strip 2  . aspirin 81 MG tablet Take 81 mg by mouth daily.    . Blood Glucose Monitoring Suppl (ONE TOUCH ULTRA 2) w/Device KIT Use as instructed to blood sugar 2 times daily. 1 each 0  . fexofenadine (ALLEGRA) 180 MG tablet Take 180 mg by mouth daily.    . Lancets (ONETOUCH ULTRASOFT) lancets Use as instructed to test blood sugar 2 times daily. 100 each 5  . losartan (COZAAR) 25 MG tablet TAKE 1 TABLET BY MOUTH EVERY DAY 90 tablet 0  . metFORMIN (GLUCOPHAGE) 500 MG tablet TAKE 2 TABLETS (1,000 MG TOTAL) BY MOUTH 2 (TWO) TIMES DAILY WITH A MEAL. 360 tablet 1  . metoprolol succinate (TOPROL-XL) 25 MG 24 hr tablet TAKE 1 TABLET BY MOUTH EVERY DAY FOR HIGH BLOOD PRESSURE 90 tablet 1  . montelukast (SINGULAIR) 10 MG tablet TAKE 1 TABLET (10 MG TOTAL) BY MOUTH AT BEDTIME. 90 tablet 1  . simvastatin (ZOCOR) 10 MG tablet TAKE 1 TABLET(10 MG) BY MOUTH AT BEDTIME 90 tablet 1  . tiotropium (SPIRIVA) 18 MCG inhalation capsule Place 1 capsule (18 mcg total) into inhaler and inhale daily. 30 capsule 3  . VENTOLIN HFA 108 (90 Base) MCG/ACT inhaler TAKE 2 PUFFS BY MOUTH EVERY 6 HOURS AS NEEDED FOR WHEEZE OR SHORTNESS OF BREATH 18 Inhaler 3   No current facility-administered medications on file prior to visit.     BP 122/74   Pulse 80   Temp (!) 97.4 F (36.3 C) (Temporal)   Ht 5' 8"  (1.727 m)   Wt 176 lb 4 oz (79.9 kg)   SpO2 98%   BMI 26.80 kg/m    Objective:   Physical Exam   Constitutional: He appears well-nourished.  Neck: Neck supple.  Cardiovascular: Normal rate and regular rhythm.  Respiratory: Effort normal and breath sounds normal.  Skin: Skin is warm and dry.  Psychiatric: He has a normal mood and affect.           Assessment & Plan:

## 2019-09-08 NOTE — Assessment & Plan Note (Signed)
Chronic intermittently for several years, evaluated by cardiology and also in the ED in 2018. Testing without obvious cause. Compliant to metoprolol.  Exam today without abnormality. ECG from recent ED visit including labs reviewed and unremarkable. TSH over the last 2 years has been normal.  Given persistent intermittent symptoms we will send him to cardiology for evaluation and perhaps Holter monitor.

## 2019-09-11 ENCOUNTER — Telehealth: Payer: Self-pay | Admitting: Physician Assistant

## 2019-09-11 NOTE — Telephone Encounter (Signed)

## 2019-09-13 DIAGNOSIS — E785 Hyperlipidemia, unspecified: Secondary | ICD-10-CM | POA: Insufficient documentation

## 2019-09-13 DIAGNOSIS — E1169 Type 2 diabetes mellitus with other specified complication: Secondary | ICD-10-CM | POA: Insufficient documentation

## 2019-09-13 DIAGNOSIS — Z87898 Personal history of other specified conditions: Secondary | ICD-10-CM | POA: Insufficient documentation

## 2019-09-13 DIAGNOSIS — I493 Ventricular premature depolarization: Secondary | ICD-10-CM | POA: Insufficient documentation

## 2019-09-13 DIAGNOSIS — R0600 Dyspnea, unspecified: Secondary | ICD-10-CM | POA: Insufficient documentation

## 2019-09-13 NOTE — Progress Notes (Signed)
Virtual Visit via Telephone Note   This visit type was conducted due to national recommendations for restrictions regarding the COVID-19 Pandemic (e.g. social distancing) in an effort to limit this patient's exposure and mitigate transmission in our community.  Due to his co-morbid illnesses, this patient is at least at moderate risk for complications without adequate follow up.  This format is felt to be most appropriate for this patient at this time.  The patient did not have access to video technology/had technical difficulties with video requiring transitioning to audio format only (telephone).  All issues noted in this document were discussed and addressed.  No physical exam could be performed with this format.  Please refer to the patient's chart for his  consent to telehealth for [facility].   Date:  09/18/2019   ID:  Brian Mullen, DOB July 04, 1946, MRN [id number]  Patient Location: Home Provider Location: Home  PCP:  Pleas Koch, NP  Cardiologist:  Kate Sable, MD  Electrophysiologist:  None   Evaluation Performed:  Follow-Up Visit  Chief Complaint:  ER f/u  History of Present Illness:    Brian Mullen is a 73 y.o. male with history of HTN, DM, HLD  and tachycardia in the setting of excessive caffeine and hypokalemia 2018, on Toprol for PVC's. Remote stress test in [place]. Last seen by Dr. Bronson Ing 09/2017 and no changes made.  Patient was in the ED 08/18/2019 with increased shortness of breath but no cause found.  Chest x-ray normal normal sinus rhythm without acute change and labs stable.  Patient says he went for a Covid19 test and was asked if he was short of breath and he said yes so was sent to ER. He says it's just because he's been inactive and has asthma. Gets short of breath when walking uphill from his garden or uphill in the cold. He has to pause and then can continue on.Patient has had some skipping of his heart and noticed his heart was skipping in ER.  Wakes up in the middle of the night and HR 90-120/m. Stopped all caffeine about a week ago and hasn't had elevated like that. Was drinking 4-5 cups of tea and 3 cups soda/day. Also happened after drinking kalhua and rum.    The patient does not have symptoms concerning for COVID-19 infection (fever, chills, cough, or new shortness of breath).    Past Medical History:  Diagnosis Date  . Allergy   . Asthma   . Diabetes mellitus without complication ([place])   . GERD (gastroesophageal reflux disease)   . Hypertension    Past Surgical History:  Procedure Laterality Date  . ELBOW SURGERY    . KNEE SURGERY Right   . TONSILLECTOMY       Current Meds  Medication Sig  . ACCU-CHEK AVIVA PLUS test strip USE AS INSTRUCTED TO TEST BLOOD SUGAR 2 TIMES DAILY  . aspirin 81 MG tablet Take 81 mg by mouth daily.  . Blood Glucose Monitoring Suppl (ONE TOUCH ULTRA 2) w/Device KIT Use as instructed to blood sugar 2 times daily.  . fexofenadine (ALLEGRA) 180 MG tablet Take 180 mg by mouth daily.  . Lancets (ONETOUCH ULTRASOFT) lancets Use as instructed to test blood sugar 2 times daily.  Marland Kitchen losartan (COZAAR) 25 MG tablet TAKE 1 TABLET BY MOUTH EVERY DAY  . metFORMIN (GLUCOPHAGE) 500 MG tablet TAKE 2 TABLETS (1,000 MG TOTAL) BY MOUTH 2 (TWO) TIMES DAILY WITH A MEAL.  . metoprolol succinate (TOPROL-XL) 25 MG 24  hr tablet TAKE 1 TABLET BY MOUTH EVERY DAY FOR HIGH BLOOD PRESSURE  . montelukast (SINGULAIR) 10 MG tablet TAKE 1 TABLET (10 MG TOTAL) BY MOUTH AT BEDTIME.  . simvastatin (ZOCOR) 10 MG tablet TAKE 1 TABLET(10 MG) BY MOUTH AT BEDTIME  . tiotropium (SPIRIVA) 18 MCG inhalation capsule Place 1 capsule (18 mcg total) into inhaler and inhale daily.  . VENTOLIN HFA 108 (90 Base) MCG/ACT inhaler TAKE 2 PUFFS BY MOUTH EVERY 6 HOURS AS NEEDED FOR WHEEZE OR SHORTNESS OF BREATH     Allergies:   Coconut oil, Iodine, Shellfish allergy, and Shellfish-derived products   Social History   Tobacco Use  . Smoking  status: Former Research scientist (life sciences)  . Smokeless tobacco: Never Used  Substance Use Topics  . Alcohol use: Yes    Alcohol/week: 1.0 - 2.0 standard drinks    Types: 1 - 2 Standard drinks or equivalent per week    Comment: occ  . Drug use: No     Family Hx: The patient's family history includes Diabetes in his maternal grandmother; Heart attack in his sister; Heart attack (age of onset: 35) in his mother; Pulmonary embolism (age of onset: 80) in his father.  ROS:   Please see the history of present illness.      All other systems reviewed and are negative.   Prior CV studies:   The following studies were reviewed today:     Labs/Other Tests and Data Reviewed:    EKG:  An ECG dated 08/21/19 was personally reviewed today and demonstrated:  NSR  Recent Labs: 03/07/2019: ALT 9 08/18/2019: BUN 8; Creatinine, Ser 0.73; Hemoglobin 13.8; Platelets 297; Potassium 3.8; Sodium 137   Recent Lipid Panel Lab Results  Component Value Date/Time   CHOL 129 03/07/2019 08:57 AM   TRIG 110.0 03/07/2019 08:57 AM   HDL 39.40 03/07/2019 08:57 AM   CHOLHDL 3 03/07/2019 08:57 AM   LDLCALC 68 03/07/2019 08:57 AM   LDLCALC 51 02/11/2018 03:32 PM   LDLDIRECT 151.6 09/22/2013 09:15 AM    Wt Readings from Last 3 Encounters:  09/08/19 176 lb 4 oz (79.9 kg)  08/18/19 170 lb (77.1 kg)  06/28/19 167 lb (75.8 kg)     Objective:    Vital Signs:  BP 124/71   Pulse (!) 59   Ht _0  (1.753 m)   BMI 26.03 kg/m    VITAL SIGNS:  reviewed  ASSESSMENT & PLAN:    1. Dyspnea in the ED 08/18/2019 chest x-ray EKG and labs without acute findings 2. Essential hypertension BP controlled 3. Hyperlipidemia LDL 68 03/07/19 4. History of tachycardia in the setting of excessive caffeine and hypokalemia 2018. Was having a lot of PVC's again when drinking excessive caffeine 5. History of PVCs on Toprol XL  COVID-19 Education: The signs and symptoms of COVID-19 were discussed with the patient and how to seek care for testing  (follow up with PCP or arrange E-visit).   The importance of social distancing was discussed today.  Time:   Today, I have spent 21 minutes with the patient with telehealth technology discussing the above problems.     Medication Adjustments/Labs and Tests Ordered: Current medicines are reviewed at length with the patient today.  Concerns regarding medicines are outlined above.   Tests Ordered: No orders of the defined types were placed in this encounter.   Medication Changes: No orders of the defined types were placed in this encounter.   Follow Up:  Either In Person or Virtual  in 6 week(s) with Ermalinda Barrios PA-C  Signed, Ermalinda Barrios, PA-C  09/18/2019 11:56 AM    [place]

## 2019-09-18 ENCOUNTER — Encounter: Payer: Self-pay | Admitting: Physician Assistant

## 2019-09-18 ENCOUNTER — Telehealth (INDEPENDENT_AMBULATORY_CARE_PROVIDER_SITE_OTHER): Payer: Medicare HMO | Admitting: Physician Assistant

## 2019-09-18 ENCOUNTER — Encounter: Payer: Self-pay | Admitting: *Deleted

## 2019-09-18 VITALS — BP 124/71 | HR 59 | Ht 69.0 in

## 2019-09-18 DIAGNOSIS — Z87898 Personal history of other specified conditions: Secondary | ICD-10-CM

## 2019-09-18 DIAGNOSIS — I10 Essential (primary) hypertension: Secondary | ICD-10-CM | POA: Diagnosis not present

## 2019-09-18 DIAGNOSIS — R002 Palpitations: Secondary | ICD-10-CM

## 2019-09-18 DIAGNOSIS — R0600 Dyspnea, unspecified: Secondary | ICD-10-CM | POA: Diagnosis not present

## 2019-09-18 DIAGNOSIS — I493 Ventricular premature depolarization: Secondary | ICD-10-CM | POA: Diagnosis not present

## 2019-09-18 DIAGNOSIS — E785 Hyperlipidemia, unspecified: Secondary | ICD-10-CM

## 2019-09-18 DIAGNOSIS — R0602 Shortness of breath: Secondary | ICD-10-CM | POA: Diagnosis not present

## 2019-09-18 NOTE — Patient Instructions (Signed)
Medication Instructions:  Your physician recommends that you continue on your current medications as directed. Please refer to the Current Medication list given to you today.  *If you need a refill on your cardiac medications before your next appointment, please call your pharmacy*  Lab Work: NONE  If you have labs (blood work) drawn today and your tests are completely normal, you will receive your results only by: Marland Kitchen MyChart Message (if you have MyChart) OR . A paper copy in the mail If you have any lab test that is abnormal or we need to change your treatment, we will call you to review the results.  Testing/Procedures: Your physician has recommended that you wear an event monitor. Event monitors are medical devices that record the heart's electrical activity. Doctors most often [place] these monitors to diagnose arrhythmias. Arrhythmias are problems with the speed or rhythm of the heartbeat. The monitor is a small, portable device. You can wear one while you do your normal daily activities. This is usually used to diagnose what is causing palpitations/syncope (passing out).  Your physician has requested that you have an echocardiogram. Echocardiography is a painless test that uses sound waves to create images of your heart. It provides your doctor with information about the size and shape of your heart and how well your heart's chambers and valves are working. This procedure takes approximately one hour. There are no restrictions for this procedure.  Your physician has requested that you have a lexiscan myoview. For further information please visit [email or web address]. Please follow instruction sheet, as given.    Follow-Up: At [facility], you and your health needs are our priority.  As part of our continuing mission to provide you with exceptional heart care, we have created designated Provider Care Teams.  These Care Teams include your primary Cardiologist (physician) and Advanced Practice  Providers (APPs -  Physician Assistants and Nurse Practitioners) who all work together to provide you with the care you need, when you need it.  Your next appointment:   6 Weeks   The format for your next appointment:   Either In Person or Virtual  Provider:   Ermalinda Barrios, PA-C  Other Instructions Thank you for choosing [facility]!

## 2019-09-18 NOTE — Addendum Note (Signed)
Addended by: Levonne Hubert on: 09/18/2019 01:18 PM   Modules accepted: Orders

## 2019-09-19 ENCOUNTER — Other Ambulatory Visit: Payer: Self-pay | Admitting: Primary Care

## 2019-09-21 ENCOUNTER — Telehealth: Payer: Self-pay | Admitting: Orthopedic Surgery

## 2019-09-21 NOTE — Telephone Encounter (Signed)
Patient called and stated that he returned DJO knee braces to you here in our office on 07/04/19.  He states that he needs for you to write a letter stating he that did return those braces.  Patient states he needs to send this to [facility].  Please let him know if he can pick that up here in the office or if you are going to mail it to him  Thanks

## 2019-09-22 NOTE — Telephone Encounter (Signed)
Letter at front desk for pickup

## 2019-09-28 ENCOUNTER — Encounter (HOSPITAL_COMMUNITY): Payer: Medicare HMO

## 2019-09-28 ENCOUNTER — Ambulatory Visit (INDEPENDENT_AMBULATORY_CARE_PROVIDER_SITE_OTHER): Payer: Medicare HMO

## 2019-09-28 ENCOUNTER — Other Ambulatory Visit (HOSPITAL_COMMUNITY): Payer: Medicare HMO

## 2019-09-28 DIAGNOSIS — I493 Ventricular premature depolarization: Secondary | ICD-10-CM | POA: Diagnosis not present

## 2019-09-28 DIAGNOSIS — R002 Palpitations: Secondary | ICD-10-CM | POA: Diagnosis not present

## 2019-10-03 ENCOUNTER — Telehealth: Payer: Self-pay | Admitting: Internal Medicine

## 2019-10-03 NOTE — Telephone Encounter (Signed)
Cardiology Moonlighter Note  Returned page from [employer]. Patient triggered event stating he had syncope. Was in sinus rhythm the entire time, no arrhythmia. Patient was called and didn't answer his phone. Will cc ordering provider  Marcie Mowers, MD Cardiology Fellow, PGY-7

## 2019-10-04 DIAGNOSIS — M546 Pain in thoracic spine: Secondary | ICD-10-CM | POA: Diagnosis not present

## 2019-10-04 DIAGNOSIS — M542 Cervicalgia: Secondary | ICD-10-CM | POA: Diagnosis not present

## 2019-10-04 DIAGNOSIS — M9903 Segmental and somatic dysfunction of lumbar region: Secondary | ICD-10-CM | POA: Diagnosis not present

## 2019-10-04 DIAGNOSIS — M545 Low back pain: Secondary | ICD-10-CM | POA: Diagnosis not present

## 2019-10-04 DIAGNOSIS — M9901 Segmental and somatic dysfunction of cervical region: Secondary | ICD-10-CM | POA: Diagnosis not present

## 2019-10-04 DIAGNOSIS — M9902 Segmental and somatic dysfunction of thoracic region: Secondary | ICD-10-CM | POA: Diagnosis not present

## 2019-10-04 NOTE — Telephone Encounter (Signed)
Ok good, glad he didn't pass out. No changes. Continue to monitor. NSR the whole time per [employer]. thanks

## 2019-10-04 NOTE — Telephone Encounter (Addendum)
Spoke with pt who states that he hit the monitor stating he had a skipped beat. And again last night when he had fast heart rate. No symptoms at this time. Pt is testing how much caffeine he can tolerate. States that he did not pass out. Please advise

## 2019-10-04 NOTE — Telephone Encounter (Signed)
Can you call and check on patient and make sure he's ok. Ask him what he was doing when he had syncope? [employer] got a call patient had syncope but in NSR the whole time. They couldn't contact patient.

## 2019-10-08 ENCOUNTER — Other Ambulatory Visit: Payer: Self-pay | Admitting: Primary Care

## 2019-10-16 DIAGNOSIS — M9903 Segmental and somatic dysfunction of lumbar region: Secondary | ICD-10-CM | POA: Diagnosis not present

## 2019-10-16 DIAGNOSIS — M9902 Segmental and somatic dysfunction of thoracic region: Secondary | ICD-10-CM | POA: Diagnosis not present

## 2019-10-16 DIAGNOSIS — M542 Cervicalgia: Secondary | ICD-10-CM | POA: Diagnosis not present

## 2019-10-16 DIAGNOSIS — M545 Low back pain: Secondary | ICD-10-CM | POA: Diagnosis not present

## 2019-10-16 DIAGNOSIS — M9901 Segmental and somatic dysfunction of cervical region: Secondary | ICD-10-CM | POA: Diagnosis not present

## 2019-10-16 DIAGNOSIS — M546 Pain in thoracic spine: Secondary | ICD-10-CM | POA: Diagnosis not present

## 2019-10-25 NOTE — Progress Notes (Signed)
Cardiology Office Note    Date:  10/30/2019   ID:  Brian Mullen, DOB Mar 17, 1946, MRN [id number]  PCP:  Brian Koch, NP  Cardiologist: Brian Sable, MD EPS: None  Chief Complaint  Patient presents with  . Follow-up    History of Present Illness:  Brian Mullen is a 73 y.o. male with history of HTN, DM, HLD  and tachycardia in the setting of excessive caffeine and hypokalemia 2018, on Toprol for PVC's. Remote stress test in [place]. Last seen by Dr. Bronson Mullen 09/2017 and no changes made.   Patient was in the ED 08/18/2019 with increased shortness of breath but no cause found.  Chest x-ray normal normal sinus rhythm without acute change and labs stable.    I saw the patient 09/18/2019 via telemedicine.  He was having a lot of heart skipping and racing and was drinking a lot of caffeine.  I ordered a 30-day Monitor and the  patient did press the button.  [employer] called [place] and told us he was in sinus rhythm time.  Concerned that he may have passed out but he denied syncope.  Patient hasn't sent the monitor back. He had 2 episodes of fast HR that awakened him at night. He had 3 cups of coffee/2 sodas and Kalhua. He has cut back on caffeine and lit's made a big difference. Chronic dyspnea on exertion. Has been inactive because of the weather. Hasn't had echo done because he thought it was a stress test and didn't want to have.   Past Medical History:  Diagnosis Date  . Allergy   . Asthma   . Diabetes mellitus without complication ([place])   . GERD (gastroesophageal reflux disease)   . Hypertension     Past Surgical History:  Procedure Laterality Date  . ELBOW SURGERY    . KNEE SURGERY Right   . TONSILLECTOMY      Current Medications: Current Meds  Medication Sig  . ACCU-CHEK AVIVA PLUS test strip USE AS INSTRUCTED TO TEST BLOOD SUGAR 2 TIMES DAILY  . aspirin 81 MG tablet Take 81 mg by mouth daily.  . Blood Glucose Monitoring Suppl (ONE TOUCH ULTRA 2) w/Device KIT Use as  instructed to blood sugar 2 times daily.  . fexofenadine (ALLEGRA) 180 MG tablet Take 180 mg by mouth daily.  . Lancets (ONETOUCH ULTRASOFT) lancets Use as instructed to test blood sugar 2 times daily.  Marland Kitchen losartan (COZAAR) 25 MG tablet TAKE 1 TABLET BY MOUTH EVERY DAY  . metFORMIN (GLUCOPHAGE) 500 MG tablet TAKE 2 TABLETS (1,000 MG TOTAL) BY MOUTH 2 (TWO) TIMES DAILY WITH A MEAL.  . metoprolol succinate (TOPROL-XL) 25 MG 24 hr tablet TAKE 1 TABLET BY MOUTH EVERY DAY FOR HIGH BLOOD PRESSURE  . montelukast (SINGULAIR) 10 MG tablet TAKE 1 TABLET BY MOUTH EVERYDAY AT BEDTIME  . simvastatin (ZOCOR) 10 MG tablet TAKE 1 TABLET(10 MG) BY MOUTH AT BEDTIME  . tiotropium (SPIRIVA) 18 MCG inhalation capsule Place 1 capsule (18 mcg total) into inhaler and inhale daily.  . VENTOLIN HFA 108 (90 Base) MCG/ACT inhaler TAKE 2 PUFFS BY MOUTH EVERY 6 HOURS AS NEEDED FOR WHEEZE OR SHORTNESS OF BREATH     Allergies:   Coconut oil, Iodine, Shellfish allergy, and Shellfish-derived products   Social History   Socioeconomic History  . Marital status: Married    Spouse name: Not on file  . Number of children: Not on file  . Years of education: Not on file  . Highest education  level: Not on file  Occupational History  . Not on file  Tobacco Use  . Smoking status: Former Research scientist (life sciences)  . Smokeless tobacco: Never Used  Substance and Sexual Activity  . Alcohol use: Yes    Alcohol/week: 1.0 - 2.0 standard drinks    Types: 1 - 2 Standard drinks or equivalent per week    Comment: occ  . Drug use: No  . Sexual activity: Not on file  Other Topics Concern  . Not on file  Social History Narrative   Married.   No children   Retried. Worked as a Furniture conservator/restorer.   Enjoys working on [employer], yard work, gardening.   Social Determinants of Health   Financial Resource Strain:   . Difficulty of Paying Living Expenses: Not on file  Food Insecurity:   . Worried About Charity fundraiser in the Last Year: Not on  file  . Ran Out of Food in the Last Year: Not on file  Transportation Needs:   . Lack of Transportation (Medical): Not on file  . Lack of Transportation (Non-Medical): Not on file  Physical Activity:   . Days of Exercise per Week: Not on file  . Minutes of Exercise per Session: Not on file  Stress:   . Feeling of Stress : Not on file  Social Connections:   . Frequency of Communication with Friends and Family: Not on file  . Frequency of Social Gatherings with Friends and Family: Not on file  . Attends Religious Services: Not on file  . Active Member of Clubs or Organizations: Not on file  . Attends Archivist Meetings: Not on file  . Marital Status: Not on file     Family History:  The patient's family history includes Diabetes in his maternal grandmother; Heart attack in his sister; Heart attack (age of onset: 92) in his mother; Pulmonary embolism (age of onset: 29) in his father.   ROS:   Please see the history of present illness.    ROS All other systems reviewed and are negative.   PHYSICAL EXAM:   VS:  BP 127/69   Pulse 72   Temp 97.6 F (36.4 C)   Ht 5' 9"  (1.753 m)   Wt 181 lb (82.1 kg)   SpO2 99%   BMI 26.73 kg/m   Physical Exam  GEN: Well nourished, well developed, in no acute distress  Neck: no JVD, carotid bruits, or masses Cardiac:RRR; 2/6 systolic murmur LSB Respiratory:  clear to auscultation bilaterally, normal work of breathing GI: soft, nontender, nondistended, + BS Ext: without cyanosis, clubbing, or edema, Good distal pulses bilaterally Neuro:  Alert and Oriented x 3 Psych: euthymic mood, full affect  Wt Readings from Last 3 Encounters:  10/30/19 181 lb (82.1 kg)  09/08/19 176 lb 4 oz (79.9 kg)  08/18/19 170 lb (77.1 kg)      Studies/Labs Reviewed:   EKG:  EKG is not ordered today. n Recent Labs: 03/07/2019: ALT 9 08/18/2019: BUN 8; Creatinine, Ser 0.73; Hemoglobin 13.8; Platelets 297; Potassium 3.8; Sodium 137   Lipid Panel     Component Value Date/Time   CHOL 129 03/07/2019 0857   TRIG 110.0 03/07/2019 0857   HDL 39.40 03/07/2019 0857   CHOLHDL 3 03/07/2019 0857   VLDL 22.0 03/07/2019 0857   LDLCALC 68 03/07/2019 0857   LDLCALC 51 02/11/2018 1532   LDLDIRECT 151.6 09/22/2013 0915    Additional studies/ records that were reviewed today include:  ASSESSMENT:    No diagnosis found.   PLAN:  In order of problems listed above:  Tachycardia in the setting of excessive caffeine and hypokalemia in 2018.  Recent increase in tachycardia and palpitations when drinking excessive caffeine again 30-day monitor-complete but needs to be mailed back.  Patient says he only had 2 episodes of tachycardia excessive caffeine during the monitoring period.  He has dramatically cut back on his caffeine doing better.  He never had his echo done because he thought it was a stress test and had trouble with that in the past.  He will go ahead and get the stress test done.  Follow-up with me after both tests are back after the first of the year.  History of PVCs on Toprol-XL  Dyspnea on exertion-chronic ER chest x-ray EKG and labs without acute findings-await echo  Essential hypertension blood pressure well controlled  Hyperlipidemia LDL 68 03/07/2019       Medication Adjustments/Labs and Tests Ordered: Current medicines are reviewed at length with the patient today.  Concerns regarding medicines are outlined above.  Medication changes, Labs and Tests ordered today are listed in the Patient Instructions below. There are no Patient Instructions on file for this visit.   Sumner Boast, PA-C  10/30/2019 11:19 AM    [facility] [place], [place], [place]  [address] Phone: [phone]; Fax: [phone]

## 2019-10-30 ENCOUNTER — Encounter: Payer: Self-pay | Admitting: Physician Assistant

## 2019-10-30 ENCOUNTER — Ambulatory Visit (INDEPENDENT_AMBULATORY_CARE_PROVIDER_SITE_OTHER): Payer: Medicare HMO | Admitting: Physician Assistant

## 2019-10-30 ENCOUNTER — Other Ambulatory Visit: Payer: Self-pay

## 2019-10-30 VITALS — BP 127/69 | HR 72 | Temp 97.6°F | Ht 69.0 in | Wt 181.0 lb

## 2019-10-30 DIAGNOSIS — E785 Hyperlipidemia, unspecified: Secondary | ICD-10-CM

## 2019-10-30 DIAGNOSIS — Z87898 Personal history of other specified conditions: Secondary | ICD-10-CM | POA: Diagnosis not present

## 2019-10-30 DIAGNOSIS — R0609 Other forms of dyspnea: Secondary | ICD-10-CM

## 2019-10-30 DIAGNOSIS — I493 Ventricular premature depolarization: Secondary | ICD-10-CM | POA: Diagnosis not present

## 2019-10-30 DIAGNOSIS — I10 Essential (primary) hypertension: Secondary | ICD-10-CM | POA: Diagnosis not present

## 2019-10-30 DIAGNOSIS — R0600 Dyspnea, unspecified: Secondary | ICD-10-CM

## 2019-10-30 NOTE — Patient Instructions (Signed)
Medication Instructions:  Your physician recommends that you continue on your current medications as directed. Please refer to the Current Medication list given to you today.  *If you need a refill on your cardiac medications before your next appointment, please call your pharmacy*  Lab Work: None If you have labs (blood work) drawn today and your tests are completely normal, you will receive your results only by: Marland Kitchen MyChart Message (if you have MyChart) OR . A paper copy in the mail If you have any lab test that is abnormal or we need to change your treatment, we will call you to review the results.  Testing/Procedures: Your physician has requested that you have an echocardiogram. Echocardiography is a painless test that uses sound waves to create images of your heart. It provides your doctor with information about the size and shape of your heart and how well your heart's chambers and valves are working. This procedure takes approximately one hour. There are no restrictions for this procedure.    Follow-Up: At [facility], you and your health needs are our priority.  As part of our continuing mission to provide you with exceptional heart care, we have created designated Provider Care Teams.  These Care Teams include your primary Cardiologist (physician) and Advanced Practice Providers (APPs -  Physician Assistants and Nurse Practitioners) who all work together to provide you with the care you need, when you need it.  Your next appointment:   1 month(s)  The format for your next appointment:   Either In Person or Virtual  Provider:   Ermalinda Barrios, PA-C  Other Instructions Mail Your monitor back today via [employer])        Thank you for choosing [facility] !

## 2019-10-30 NOTE — Addendum Note (Signed)
Addended by: Barbarann Ehlers A on: 10/30/2019 11:34 AM   Modules accepted: Orders

## 2019-10-31 ENCOUNTER — Other Ambulatory Visit: Payer: Self-pay

## 2019-10-31 ENCOUNTER — Ambulatory Visit (HOSPITAL_COMMUNITY)
Admission: RE | Admit: 2019-10-31 | Discharge: 2019-10-31 | Disposition: A | Discharge status: Home / Self Care | Payer: Medicare HMO | Source: Ambulatory Visit | Attending: Physician Assistant | Admitting: Physician Assistant

## 2019-10-31 DIAGNOSIS — R0600 Dyspnea, unspecified: Secondary | ICD-10-CM | POA: Diagnosis not present

## 2019-10-31 DIAGNOSIS — R0609 Other forms of dyspnea: Secondary | ICD-10-CM

## 2019-10-31 NOTE — Progress Notes (Signed)
*  PRELIMINARY RESULTS* Echocardiogram 2D Echocardiogram has been performed.  Brian Mullen 10/31/2019, 2:04 PM

## 2019-11-01 ENCOUNTER — Other Ambulatory Visit: Payer: Self-pay | Admitting: Primary Care

## 2019-11-01 DIAGNOSIS — E785 Hyperlipidemia, unspecified: Secondary | ICD-10-CM

## 2019-11-13 DIAGNOSIS — M542 Cervicalgia: Secondary | ICD-10-CM | POA: Diagnosis not present

## 2019-11-13 DIAGNOSIS — M9902 Segmental and somatic dysfunction of thoracic region: Secondary | ICD-10-CM | POA: Diagnosis not present

## 2019-11-13 DIAGNOSIS — M546 Pain in thoracic spine: Secondary | ICD-10-CM | POA: Diagnosis not present

## 2019-11-13 DIAGNOSIS — M545 Low back pain: Secondary | ICD-10-CM | POA: Diagnosis not present

## 2019-11-13 DIAGNOSIS — M9901 Segmental and somatic dysfunction of cervical region: Secondary | ICD-10-CM | POA: Diagnosis not present

## 2019-11-13 DIAGNOSIS — M9903 Segmental and somatic dysfunction of lumbar region: Secondary | ICD-10-CM | POA: Diagnosis not present

## 2019-11-15 DIAGNOSIS — M542 Cervicalgia: Secondary | ICD-10-CM | POA: Diagnosis not present

## 2019-11-15 DIAGNOSIS — M9901 Segmental and somatic dysfunction of cervical region: Secondary | ICD-10-CM | POA: Diagnosis not present

## 2019-11-15 DIAGNOSIS — M9902 Segmental and somatic dysfunction of thoracic region: Secondary | ICD-10-CM | POA: Diagnosis not present

## 2019-11-15 DIAGNOSIS — M545 Low back pain: Secondary | ICD-10-CM | POA: Diagnosis not present

## 2019-11-15 DIAGNOSIS — M9903 Segmental and somatic dysfunction of lumbar region: Secondary | ICD-10-CM | POA: Diagnosis not present

## 2019-11-15 DIAGNOSIS — M546 Pain in thoracic spine: Secondary | ICD-10-CM | POA: Diagnosis not present

## 2019-11-20 ENCOUNTER — Other Ambulatory Visit: Payer: Self-pay | Admitting: Primary Care

## 2019-11-20 DIAGNOSIS — M546 Pain in thoracic spine: Secondary | ICD-10-CM | POA: Diagnosis not present

## 2019-11-20 DIAGNOSIS — M9901 Segmental and somatic dysfunction of cervical region: Secondary | ICD-10-CM | POA: Diagnosis not present

## 2019-11-20 DIAGNOSIS — M542 Cervicalgia: Secondary | ICD-10-CM | POA: Diagnosis not present

## 2019-11-20 DIAGNOSIS — M9902 Segmental and somatic dysfunction of thoracic region: Secondary | ICD-10-CM | POA: Diagnosis not present

## 2019-11-20 DIAGNOSIS — M9903 Segmental and somatic dysfunction of lumbar region: Secondary | ICD-10-CM | POA: Diagnosis not present

## 2019-11-20 DIAGNOSIS — M545 Low back pain: Secondary | ICD-10-CM | POA: Diagnosis not present

## 2019-11-22 DIAGNOSIS — M9903 Segmental and somatic dysfunction of lumbar region: Secondary | ICD-10-CM | POA: Diagnosis not present

## 2019-11-22 DIAGNOSIS — M545 Low back pain: Secondary | ICD-10-CM | POA: Diagnosis not present

## 2019-11-22 DIAGNOSIS — M546 Pain in thoracic spine: Secondary | ICD-10-CM | POA: Diagnosis not present

## 2019-11-22 DIAGNOSIS — M9901 Segmental and somatic dysfunction of cervical region: Secondary | ICD-10-CM | POA: Diagnosis not present

## 2019-11-22 DIAGNOSIS — I471 Supraventricular tachycardia: Secondary | ICD-10-CM | POA: Insufficient documentation

## 2019-11-22 DIAGNOSIS — M542 Cervicalgia: Secondary | ICD-10-CM | POA: Diagnosis not present

## 2019-11-22 DIAGNOSIS — M9902 Segmental and somatic dysfunction of thoracic region: Secondary | ICD-10-CM | POA: Diagnosis not present

## 2019-11-22 NOTE — Progress Notes (Signed)
Cardiology Office Note    Date:  11/27/2019   ID:  Brian Mullen, DOB [birth year], MRN [id number]  PCP:  Brian Koch, NP  Cardiologist: Brian Sable, MD EPS: None  No chief complaint on file.   History of Present Illness:  Brian Mullen is a 74 y.o. male with history of HTN, DM, HLD  and tachycardia in the setting of excessive caffeine and hypokalemia 2018, on Toprol for PVC's. Remote stress test in [place]. Last seen by Dr. Bronson Mullen 09/2017 and no changes made.   Patient was in the ED 08/18/2019 with increased shortness of breath but no cause found.  Chest x-ray normal normal sinus rhythm without acute change and labs stable.     I saw the patient 09/18/2019 via telemedicine.  He was having a lot of heart skipping and racing and was drinking a lot of caffeine.  I ordered a 30-day Monitor and the  patient did press the button.  [facility] called [place] and told us he was in sinus rhythm time.  Concerned that he may have passed out but he denied syncope.   Echo 10/31/19 LVEF 55-60%. holter NSR isolated PVC's.  Patient has cut back to 1/2 cup caffeine daily. Feeling better without further palpitations. Has gained 10 lbs with covid as he's not exercising. Going to try silver sneakers.     Past Medical History:  Diagnosis Date  . Allergy   . Asthma   . Diabetes mellitus without complication ([place])   . GERD (gastroesophageal reflux disease)   . Hypertension     Past Surgical History:  Procedure Laterality Date  . ELBOW SURGERY    . KNEE SURGERY Right   . TONSILLECTOMY      Current Medications: Current Meds  Medication Sig  . ACCU-CHEK AVIVA PLUS test strip USE AS INSTRUCTED TO TEST BLOOD SUGAR 2 TIMES DAILY  . aspirin 81 MG tablet Take 81 mg by mouth daily.  . Blood Glucose Monitoring Suppl (ONE TOUCH ULTRA 2) w/Device KIT Use as instructed to blood sugar 2 times daily.  . fexofenadine (ALLEGRA) 180 MG tablet Take 180 mg by mouth daily.  . Lancets (ONETOUCH ULTRASOFT)  lancets Use as instructed to test blood sugar 2 times daily.  Marland Kitchen losartan (COZAAR) 25 MG tablet TAKE 1 TABLET BY MOUTH EVERY DAY  . metFORMIN (GLUCOPHAGE) 500 MG tablet TAKE 2 TABLETS (1,000 MG TOTAL) BY MOUTH 2 (TWO) TIMES DAILY WITH A MEAL.  . metoprolol succinate (TOPROL-XL) 25 MG 24 hr tablet TAKE 1 TABLET BY MOUTH EVERY DAY FOR HIGH BLOOD PRESSURE  . montelukast (SINGULAIR) 10 MG tablet TAKE 1 TABLET BY MOUTH EVERYDAY AT BEDTIME  . simvastatin (ZOCOR) 10 MG tablet TAKE 1 TABLET(10 MG) BY MOUTH AT BEDTIME  . tiotropium (SPIRIVA) 18 MCG inhalation capsule Place 1 capsule (18 mcg total) into inhaler and inhale daily.  . VENTOLIN HFA 108 (90 Base) MCG/ACT inhaler TAKE 2 PUFFS BY MOUTH EVERY 6 HOURS AS NEEDED FOR WHEEZE OR SHORTNESS OF BREATH     Allergies:   Coconut oil, Iodine, Shellfish allergy, and Shellfish-derived products   Social History   Socioeconomic History  . Marital status: Married    Spouse name: Not on file  . Number of children: Not on file  . Years of education: Not on file  . Highest education level: Not on file  Occupational History  . Not on file  Tobacco Use  . Smoking status: Former Research scientist (life sciences)  . Smokeless tobacco: Never Used  Substance and  Sexual Activity  . Alcohol use: Yes    Alcohol/week: 1.0 - 2.0 standard drinks    Types: 1 - 2 Standard drinks or equivalent per week    Comment: occ  . Drug use: No  . Sexual activity: Not on file  Other Topics Concern  . Not on file  Social History Narrative   Married.   No children   Retried. Worked as a Furniture conservator/restorer.   Enjoys working on [employer], yard work, gardening.   Social Determinants of Health   Financial Resource Strain:   . Difficulty of Paying Living Expenses: Not on file  Food Insecurity:   . Worried About Charity fundraiser in the Last Year: Not on file  . Ran Out of Food in the Last Year: Not on file  Transportation Needs:   . Lack of Transportation (Medical): Not on file  . Lack of  Transportation (Non-Medical): Not on file  Physical Activity:   . Days of Exercise per Week: Not on file  . Minutes of Exercise per Session: Not on file  Stress:   . Feeling of Stress : Not on file  Social Connections:   . Frequency of Communication with Friends and Family: Not on file  . Frequency of Social Gatherings with Friends and Family: Not on file  . Attends Religious Services: Not on file  . Active Member of Clubs or Organizations: Not on file  . Attends Archivist Meetings: Not on file  . Marital Status: Not on file     Family History:  The patient's   family history includes Diabetes in his maternal grandmother; Heart attack in his sister; Heart attack (age of onset: 30) in his mother; Pulmonary embolism (age of onset: 63) in his father.   ROS:   Please see the history of present illness.    ROS All other systems reviewed and are negative.   PHYSICAL EXAM:   VS:  BP 132/70   Pulse 90   Temp (!) 96.9 F (36.1 C)   Ht 5' 9"  (1.753 m)   Wt 186 lb (84.4 kg)   SpO2 97%   BMI 27.47 kg/m   Physical Exam  GEN: Well nourished, well developed, in no acute distress  Neck: no JVD, carotid bruits, or masses WGNFAOZ:HYQ;6/5 systolic murmur LSB  Respiratory:  clear to auscultation bilaterally, normal work of breathing GI: soft, nontender, nondistended, + BS Ext: without cyanosis, clubbing, or edema, Good distal pulses bilaterally Neuro:  Alert and Oriented x 3 Psych: euthymic mood, full affect  Wt Readings from Last 3 Encounters:  11/27/19 186 lb (84.4 kg)  10/30/19 181 lb (82.1 kg)  09/08/19 176 lb 4 oz (79.9 kg)      Studies/Labs Reviewed:   EKG:  EKG is not ordered today.   Recent Labs: 03/07/2019: ALT 9 08/18/2019: BUN 8; Creatinine, Ser 0.73; Hemoglobin 13.8; Platelets 297; Potassium 3.8; Sodium 137   Lipid Panel    Component Value Date/Time   CHOL 129 03/07/2019 0857   TRIG 110.0 03/07/2019 0857   HDL 39.40 03/07/2019 0857   CHOLHDL 3 03/07/2019  0857   VLDL 22.0 03/07/2019 0857   LDLCALC 68 03/07/2019 0857   LDLCALC 51 02/11/2018 1532   LDLDIRECT 151.6 09/22/2013 0915    Additional studies/ records that were reviewed today include:  Echo 10/31/19 IMPRESSIONS      1. Left ventricular ejection fraction, by visual estimation, is 55 to 60%. The left ventricle has normal function.  There is no left ventricular hypertrophy.  2. The left ventricle has no regional wall motion abnormalities.  3. Global right ventricle has normal systolic function.The right ventricular size is normal. No increase in right ventricular wall thickness.  4. Left atrial size was normal.  5. Right atrial size was normal.  6. Presence of pericardial fat pad.  7. The mitral valve is grossly normal. No evidence of mitral valve regurgitation.  8. The tricuspid valve is grossly normal. Tricuspid valve regurgitation is trivial.  9. The aortic valve is tricuspid. Aortic valve regurgitation is not visualized. 10. The pulmonic valve was not well visualized. Pulmonic valve regurgitation is not visualized. 11. TR signal is inadequate for assessing pulmonary artery systolic pressure. 12. The inferior vena cava is normal in size with <50% respiratory variability, suggesting right atrial pressure of 8 mmHg.    Holter monitor 09/29/19 Study Highlights   Predominantly sinus rhythm, average heart rate 66 bpm. Isolated PVC. No significant arrhythmias.        ASSESSMENT:    1. Atrial tachycardia ([place])   2. PVC's (premature ventricular contractions)   3. Essential hypertension   4. Hyperlipidemia, unspecified hyperlipidemia type      PLAN:  In order of problems listed above:  Atrial Tachycardia/PVCs in the setting of excessive caffeine and hypokalemia 2018.  Holter monitor showed normal sinus rhythm with isolated PVC no significant arrhythmias.Doing much better without further palpitations since cut back on caffeine..  Echo shows normal LV function no significant  valvular abnormality.  Continue Toprol-XL. F/U with Dr. Bronson Mullen in 6 months  Chronic dyspnea on exertion echo with normal LV function.  Essential hypertension blood pressure controlled  Hyperlipidemia LDL 68 in 02/2019    Medication Adjustments/Labs and Tests Ordered: Current medicines are reviewed at length with the patient today.  Concerns regarding medicines are outlined above.  Medication changes, Labs and Tests ordered today are listed in the Patient Instructions below. There are no Patient Instructions on file for this visit.   Sumner Boast, PA-C  11/27/2019 11:32 AM    [facility] [address], [place],   [address] Phone: [phone]; Fax: [phone]

## 2019-11-27 ENCOUNTER — Encounter: Payer: Self-pay | Admitting: Physician Assistant

## 2019-11-27 ENCOUNTER — Other Ambulatory Visit: Payer: Self-pay | Admitting: Primary Care

## 2019-11-27 ENCOUNTER — Ambulatory Visit: Payer: Medicare HMO | Admitting: Physician Assistant

## 2019-11-27 VITALS — BP 132/70 | HR 90 | Temp 96.9°F | Ht 69.0 in | Wt 186.0 lb

## 2019-11-27 DIAGNOSIS — I493 Ventricular premature depolarization: Secondary | ICD-10-CM | POA: Diagnosis not present

## 2019-11-27 DIAGNOSIS — I10 Essential (primary) hypertension: Secondary | ICD-10-CM

## 2019-11-27 DIAGNOSIS — I471 Supraventricular tachycardia: Secondary | ICD-10-CM

## 2019-11-27 DIAGNOSIS — E785 Hyperlipidemia, unspecified: Secondary | ICD-10-CM | POA: Diagnosis not present

## 2019-11-27 NOTE — Patient Instructions (Signed)
Medication Instructions:  Your physician recommends that you continue on your current medications as directed. Please refer to the Current Medication list given to you today.  *If you need a refill on your cardiac medications before your next appointment, please call your pharmacy*  Lab Work: NONE If you have labs (blood work) drawn today and your tests are completely normal, you will receive your results only by: Marland Kitchen MyChart Message (if you have MyChart) OR . A paper copy in the mail If you have any lab test that is abnormal or we need to change your treatment, we will call you to review the results.  Testing/Procedures: NONE  Follow-Up: At [facility], you and your health needs are our priority.  As part of our continuing mission to provide you with exceptional heart care, we have created designated Provider Care Teams.  These Care Teams include your primary Cardiologist (physician) and Advanced Practice Providers (APPs -  Physician Assistants and Nurse Practitioners) who all work together to provide you with the care you need, when you need it.  Your next appointment:   6 month(s)  The format for your next appointment:   In Person  Provider:   Prentice Docker, MD  Other Instructions NONE    Thank you for choosing [facility] !

## 2019-11-28 ENCOUNTER — Other Ambulatory Visit: Payer: Self-pay | Admitting: Primary Care

## 2019-11-28 DIAGNOSIS — I10 Essential (primary) hypertension: Secondary | ICD-10-CM

## 2019-11-29 DIAGNOSIS — M9902 Segmental and somatic dysfunction of thoracic region: Secondary | ICD-10-CM | POA: Diagnosis not present

## 2019-11-29 DIAGNOSIS — M9903 Segmental and somatic dysfunction of lumbar region: Secondary | ICD-10-CM | POA: Diagnosis not present

## 2019-11-29 DIAGNOSIS — M9901 Segmental and somatic dysfunction of cervical region: Secondary | ICD-10-CM | POA: Diagnosis not present

## 2019-11-29 DIAGNOSIS — M545 Low back pain: Secondary | ICD-10-CM | POA: Diagnosis not present

## 2019-11-29 DIAGNOSIS — M546 Pain in thoracic spine: Secondary | ICD-10-CM | POA: Diagnosis not present

## 2019-11-29 DIAGNOSIS — M542 Cervicalgia: Secondary | ICD-10-CM | POA: Diagnosis not present

## 2019-12-27 DIAGNOSIS — M542 Cervicalgia: Secondary | ICD-10-CM | POA: Diagnosis not present

## 2019-12-27 DIAGNOSIS — M9903 Segmental and somatic dysfunction of lumbar region: Secondary | ICD-10-CM | POA: Diagnosis not present

## 2019-12-27 DIAGNOSIS — M9901 Segmental and somatic dysfunction of cervical region: Secondary | ICD-10-CM | POA: Diagnosis not present

## 2019-12-27 DIAGNOSIS — M546 Pain in thoracic spine: Secondary | ICD-10-CM | POA: Diagnosis not present

## 2019-12-27 DIAGNOSIS — M9902 Segmental and somatic dysfunction of thoracic region: Secondary | ICD-10-CM | POA: Diagnosis not present

## 2019-12-27 DIAGNOSIS — M545 Low back pain: Secondary | ICD-10-CM | POA: Diagnosis not present

## 2020-01-03 DIAGNOSIS — M546 Pain in thoracic spine: Secondary | ICD-10-CM | POA: Diagnosis not present

## 2020-01-03 DIAGNOSIS — M9901 Segmental and somatic dysfunction of cervical region: Secondary | ICD-10-CM | POA: Diagnosis not present

## 2020-01-03 DIAGNOSIS — M545 Low back pain: Secondary | ICD-10-CM | POA: Diagnosis not present

## 2020-01-03 DIAGNOSIS — M542 Cervicalgia: Secondary | ICD-10-CM | POA: Diagnosis not present

## 2020-01-03 DIAGNOSIS — M9902 Segmental and somatic dysfunction of thoracic region: Secondary | ICD-10-CM | POA: Diagnosis not present

## 2020-01-03 DIAGNOSIS — M9903 Segmental and somatic dysfunction of lumbar region: Secondary | ICD-10-CM | POA: Diagnosis not present

## 2020-01-12 DIAGNOSIS — M545 Low back pain: Secondary | ICD-10-CM | POA: Diagnosis not present

## 2020-01-12 DIAGNOSIS — M9902 Segmental and somatic dysfunction of thoracic region: Secondary | ICD-10-CM | POA: Diagnosis not present

## 2020-01-12 DIAGNOSIS — M546 Pain in thoracic spine: Secondary | ICD-10-CM | POA: Diagnosis not present

## 2020-01-12 DIAGNOSIS — M9903 Segmental and somatic dysfunction of lumbar region: Secondary | ICD-10-CM | POA: Diagnosis not present

## 2020-01-12 DIAGNOSIS — M542 Cervicalgia: Secondary | ICD-10-CM | POA: Diagnosis not present

## 2020-01-12 DIAGNOSIS — M9901 Segmental and somatic dysfunction of cervical region: Secondary | ICD-10-CM | POA: Diagnosis not present

## 2020-02-02 ENCOUNTER — Other Ambulatory Visit: Payer: Self-pay | Admitting: Primary Care

## 2020-02-07 ENCOUNTER — Other Ambulatory Visit: Payer: Self-pay

## 2020-02-07 DIAGNOSIS — E785 Hyperlipidemia, unspecified: Secondary | ICD-10-CM

## 2020-02-07 DIAGNOSIS — I10 Essential (primary) hypertension: Secondary | ICD-10-CM

## 2020-02-07 MED ORDER — MONTELUKAST SODIUM 10 MG PO TABS: ORAL_TABLET | ORAL | 2 refills | Status: DC

## 2020-02-07 MED ORDER — SIMVASTATIN 10 MG PO TABS: ORAL_TABLET | ORAL | 2 refills | Status: DC

## 2020-02-07 MED ORDER — METOPROLOL SUCCINATE ER 25 MG PO TB24: 25.0000 mg | ORAL_TABLET | Freq: Every day | ORAL | 2 refills | Status: DC

## 2020-02-07 MED ORDER — METFORMIN HCL 500 MG PO TABS: 1000.0000 mg | ORAL_TABLET | Freq: Two times a day (BID) | ORAL | 2 refills | Status: DC

## 2020-02-21 DIAGNOSIS — M546 Pain in thoracic spine: Secondary | ICD-10-CM | POA: Diagnosis not present

## 2020-02-21 DIAGNOSIS — M545 Low back pain: Secondary | ICD-10-CM | POA: Diagnosis not present

## 2020-02-21 DIAGNOSIS — M542 Cervicalgia: Secondary | ICD-10-CM | POA: Diagnosis not present

## 2020-02-21 DIAGNOSIS — M9902 Segmental and somatic dysfunction of thoracic region: Secondary | ICD-10-CM | POA: Diagnosis not present

## 2020-02-21 DIAGNOSIS — M9903 Segmental and somatic dysfunction of lumbar region: Secondary | ICD-10-CM | POA: Diagnosis not present

## 2020-02-21 DIAGNOSIS — M9901 Segmental and somatic dysfunction of cervical region: Secondary | ICD-10-CM | POA: Diagnosis not present

## 2020-02-22 ENCOUNTER — Other Ambulatory Visit: Payer: Self-pay | Admitting: Primary Care

## 2020-02-22 DIAGNOSIS — E119 Type 2 diabetes mellitus without complications: Secondary | ICD-10-CM

## 2020-02-22 DIAGNOSIS — I10 Essential (primary) hypertension: Secondary | ICD-10-CM

## 2020-02-22 DIAGNOSIS — E7800 Pure hypercholesterolemia, unspecified: Secondary | ICD-10-CM

## 2020-02-23 DIAGNOSIS — M546 Pain in thoracic spine: Secondary | ICD-10-CM | POA: Diagnosis not present

## 2020-02-23 DIAGNOSIS — M9902 Segmental and somatic dysfunction of thoracic region: Secondary | ICD-10-CM | POA: Diagnosis not present

## 2020-02-23 DIAGNOSIS — M9901 Segmental and somatic dysfunction of cervical region: Secondary | ICD-10-CM | POA: Diagnosis not present

## 2020-02-23 DIAGNOSIS — M542 Cervicalgia: Secondary | ICD-10-CM | POA: Diagnosis not present

## 2020-02-23 DIAGNOSIS — M9903 Segmental and somatic dysfunction of lumbar region: Secondary | ICD-10-CM | POA: Diagnosis not present

## 2020-02-23 DIAGNOSIS — M545 Low back pain: Secondary | ICD-10-CM | POA: Diagnosis not present

## 2020-02-29 DIAGNOSIS — M9902 Segmental and somatic dysfunction of thoracic region: Secondary | ICD-10-CM | POA: Diagnosis not present

## 2020-02-29 DIAGNOSIS — M9901 Segmental and somatic dysfunction of cervical region: Secondary | ICD-10-CM | POA: Diagnosis not present

## 2020-02-29 DIAGNOSIS — M9903 Segmental and somatic dysfunction of lumbar region: Secondary | ICD-10-CM | POA: Diagnosis not present

## 2020-02-29 DIAGNOSIS — M546 Pain in thoracic spine: Secondary | ICD-10-CM | POA: Diagnosis not present

## 2020-02-29 DIAGNOSIS — M542 Cervicalgia: Secondary | ICD-10-CM | POA: Diagnosis not present

## 2020-02-29 DIAGNOSIS — M545 Low back pain: Secondary | ICD-10-CM | POA: Diagnosis not present

## 2020-03-06 ENCOUNTER — Other Ambulatory Visit (INDEPENDENT_AMBULATORY_CARE_PROVIDER_SITE_OTHER): Payer: Medicare HMO

## 2020-03-06 ENCOUNTER — Telehealth: Payer: Self-pay

## 2020-03-06 ENCOUNTER — Other Ambulatory Visit: Payer: Self-pay

## 2020-03-06 ENCOUNTER — Ambulatory Visit: Payer: Medicare HMO

## 2020-03-06 DIAGNOSIS — E119 Type 2 diabetes mellitus without complications: Secondary | ICD-10-CM

## 2020-03-06 DIAGNOSIS — I10 Essential (primary) hypertension: Secondary | ICD-10-CM

## 2020-03-06 DIAGNOSIS — E7800 Pure hypercholesterolemia, unspecified: Secondary | ICD-10-CM | POA: Diagnosis not present

## 2020-03-06 LAB — LIPID PANEL
Cholesterol: 134 mg/dL (ref 0–200)
HDL: 42 mg/dL (ref >39.00)
LDL Cholesterol: 66 mg/dL (ref 0–99)
NonHDL: 92.26
Total CHOL/HDL Ratio: 3
Triglycerides: 132 mg/dL (ref 0.0–149.0)
VLDL: 26.4 mg/dL (ref 0.0–40.0)

## 2020-03-06 LAB — COMPREHENSIVE METABOLIC PANEL
ALT: 12 U/L (ref 0–53)
AST: 17 U/L (ref 0–37)
Albumin: 4.1 g/dL (ref 3.5–5.2)
Alkaline Phosphatase: 55 U/L (ref 39–117)
BUN: 9 mg/dL (ref 6–23)
CO2: 31 mEq/L (ref 19–32)
Calcium: 9 mg/dL (ref 8.4–10.5)
Chloride: 97 mEq/L (ref 96–112)
Creatinine, Ser: 0.89 mg/dL (ref 0.40–1.50)
GFR: 83.52 mL/min (ref >60.00)
Glucose, Bld: 169 mg/dL — ABNORMAL HIGH (ref 70–99)
Potassium: 4.6 mEq/L (ref 3.5–5.1)
Sodium: 132 mEq/L — ABNORMAL LOW (ref 135–145)
Total Bilirubin: 0.5 mg/dL (ref 0.2–1.2)
Total Protein: 6.6 g/dL (ref 6.0–8.3)

## 2020-03-06 LAB — HEMOGLOBIN A1C: Hgb A1c MFr Bld: 7 % — ABNORMAL HIGH (ref 4.6–6.5)

## 2020-03-06 NOTE — Telephone Encounter (Signed)
Noted  

## 2020-03-06 NOTE — Telephone Encounter (Signed)
Called patient 3 times trying to complete his Medicare wellness visit. Patient never answered the call. Left HIPAA complaint voicemail notifying patient he can call the office to reschedule or visit may be completed by provider at upcoming office visit.

## 2020-03-11 ENCOUNTER — Encounter: Payer: Medicare HMO | Admitting: Primary Care

## 2020-03-14 ENCOUNTER — Telehealth: Payer: Self-pay

## 2020-03-14 NOTE — Telephone Encounter (Signed)
Pt called triage line stating he started having cramping in his hands and back. Has had issues with leg cramps in the past and kate gave him something for that. Made sure to drink lots of fluids, but still having cramping. Asking what else can he do. Please advise. [phone]. He has an appt here 03-19-20. I advised him it was a possibility that it could be tomorrow before he got a call back as [name] and Johny Drilling are gone for the day. I will see if another provider will respond.

## 2020-03-14 NOTE — Telephone Encounter (Signed)
This can wait until Brian Mullen returns tomorrow.

## 2020-03-15 NOTE — Telephone Encounter (Signed)
Per DPR, left detail message of Kate Clark's comments for patient. 

## 2020-03-15 NOTE — Telephone Encounter (Signed)
Please notify patient that I will need to see him to discuss further. He can try to hold his simvastatin to see if this helps improve his aches.  We can follow-up as scheduled on May 4, or he may schedule a visit sooner if he prefers.

## 2020-03-19 ENCOUNTER — Ambulatory Visit (INDEPENDENT_AMBULATORY_CARE_PROVIDER_SITE_OTHER): Payer: Medicare HMO | Admitting: Primary Care

## 2020-03-19 ENCOUNTER — Encounter: Payer: Self-pay | Admitting: Primary Care

## 2020-03-19 ENCOUNTER — Other Ambulatory Visit: Payer: Self-pay

## 2020-03-19 VITALS — BP 126/84 | HR 82 | Temp 96.0°F | Ht 66.0 in | Wt 179.5 lb

## 2020-03-19 DIAGNOSIS — E785 Hyperlipidemia, unspecified: Secondary | ICD-10-CM | POA: Diagnosis not present

## 2020-03-19 DIAGNOSIS — R252 Cramp and spasm: Secondary | ICD-10-CM | POA: Diagnosis not present

## 2020-03-19 DIAGNOSIS — J4540 Moderate persistent asthma, uncomplicated: Secondary | ICD-10-CM | POA: Diagnosis not present

## 2020-03-19 DIAGNOSIS — Z0000 Encounter for general adult medical examination without abnormal findings: Secondary | ICD-10-CM | POA: Diagnosis not present

## 2020-03-19 DIAGNOSIS — R3915 Urgency of urination: Secondary | ICD-10-CM | POA: Insufficient documentation

## 2020-03-19 DIAGNOSIS — I10 Essential (primary) hypertension: Secondary | ICD-10-CM

## 2020-03-19 DIAGNOSIS — E7800 Pure hypercholesterolemia, unspecified: Secondary | ICD-10-CM | POA: Diagnosis not present

## 2020-03-19 DIAGNOSIS — E119 Type 2 diabetes mellitus without complications: Secondary | ICD-10-CM | POA: Diagnosis not present

## 2020-03-19 DIAGNOSIS — R350 Frequency of micturition: Secondary | ICD-10-CM | POA: Diagnosis not present

## 2020-03-19 MED ORDER — ALBUTEROL SULFATE HFA 108 (90 BASE) MCG/ACT IN AERS: INHALATION_SPRAY | RESPIRATORY_TRACT | 0 refills | Status: DC

## 2020-03-19 MED ORDER — ROSUVASTATIN CALCIUM 5 MG PO TABS: 5.0000 mg | ORAL_TABLET | Freq: Every evening | ORAL | 0 refills | Status: DC

## 2020-03-19 MED ORDER — TIZANIDINE HCL 4 MG PO TABS: 4.0000 mg | ORAL_TABLET | Freq: Every evening | ORAL | 0 refills | Status: DC | PRN

## 2020-03-19 NOTE — Assessment & Plan Note (Signed)
During the day mostly, along with urgency. Chronic.  Could be BPH. Also A1C has increased to 7.0, little water intake.  Discussed options for BPH treatment, he kindly declines and will think about this. In the mean time he will work on increasing water intake and activity levels, working on diet.

## 2020-03-19 NOTE — Assessment & Plan Note (Signed)
Well controlled in the office today. Continue losartan and metoprolol succinate. CMP reviewed.

## 2020-03-19 NOTE — Patient Instructions (Signed)
Stop simvastatin medication for cholesterol.  Start rosuvastatin (Crestor) once nightly for cholesterol. Please notify me if you develop the cramping.  Start exercising. You should be getting 150 minutes of moderate intensity exercise weekly.  It is important that you improve your diet. Please limit carbohydrates in the form of white bread, rice, pasta, sweets, fast food, fried food, sugary drinks, etc. Increase your consumption of fresh fruits and vegetables, whole grains, lean protein.  Ensure you are consuming 64 ounces of water daily.  Please schedule a follow up appointment in 6 months for diabetes check.   It was a pleasure to see you today!   Preventive Care 45 Years and Older, Male Preventive care refers to lifestyle choices and visits with your health care provider that can promote health and wellness. This includes:  A yearly physical exam. This is also called an annual well check.  Regular dental and eye exams.  Immunizations.  Screening for certain conditions.  Healthy lifestyle choices, such as diet and exercise. What can I expect for my preventive care visit? Physical exam Your health care provider will check:  Height and weight. These may be used to calculate body mass index (BMI), which is a measurement that tells if you are at a healthy weight.  Heart rate and blood pressure.  Your skin for abnormal spots. Counseling Your health care provider may ask you questions about:  Alcohol, tobacco, and drug use.  Emotional well-being.  Home and relationship well-being.  Sexual activity.  Eating habits.  History of falls.  Memory and ability to understand (cognition).  Work and work Statistician. What immunizations do I need?  Influenza (flu) vaccine  This is recommended every year. Tetanus, diphtheria, and pertussis (Tdap) vaccine  You may need a Td booster every 10 years. Varicella (chickenpox) vaccine  You may need this vaccine if you have not  already been vaccinated. Zoster (shingles) vaccine  You may need this after age 28. Pneumococcal conjugate (PCV13) vaccine  One dose is recommended after age 26. Pneumococcal polysaccharide (PPSV23) vaccine  One dose is recommended after age 51. Measles, mumps, and rubella (MMR) vaccine  You may need at least one dose of MMR if you were born in 1957 or later. You may also need a second dose. Meningococcal conjugate (MenACWY) vaccine  You may need this if you have certain conditions. Hepatitis A vaccine  You may need this if you have certain conditions or if you travel or work in places where you may be exposed to hepatitis A. Hepatitis B vaccine  You may need this if you have certain conditions or if you travel or work in places where you may be exposed to hepatitis B. Haemophilus influenzae type b (Hib) vaccine  You may need this if you have certain conditions. You may receive vaccines as individual doses or as more than one vaccine together in one shot (combination vaccines). Talk with your health care provider about the risks and benefits of combination vaccines. What tests do I need? Blood tests  Lipid and cholesterol levels. These may be checked every 5 years, or more frequently depending on your overall health.  Hepatitis C test.  Hepatitis B test. Screening  Lung cancer screening. You may have this screening every year starting at age 59 if you have a 30-pack-year history of smoking and currently smoke or have quit within the past 15 years.  Colorectal cancer screening. All adults should have this screening starting at age 70 and continuing until age 86. Your  health care provider may recommend screening at age 39 if you are at increased risk. You will have tests every 1-10 years, depending on your results and the type of screening test.  Prostate cancer screening. Recommendations will vary depending on your family history and other risks.  Diabetes screening. This is  done by checking your blood sugar (glucose) after you have not eaten for a while (fasting). You may have this done every 1-3 years.  Abdominal aortic aneurysm (AAA) screening. You may need this if you are a current or former smoker.  Sexually transmitted disease (STD) testing. Follow these instructions at home: Eating and drinking  Eat a diet that includes fresh fruits and vegetables, whole grains, lean protein, and low-fat dairy products. Limit your intake of foods with high amounts of sugar, saturated fats, and salt.  Take vitamin and mineral supplements as recommended by your health care provider.  Do not drink alcohol if your health care provider tells you not to drink.  If you drink alcohol: ? Limit how much you have to 0-2 drinks a day. ? Be aware of how much alcohol is in your drink. In the [place] one drink equals one 12 oz bottle of beer (355 mL), one 5 oz glass of wine (148 mL), or one 1 oz glass of hard liquor (44 mL). Lifestyle  Take daily care of your teeth and gums.  Stay active. Exercise for at least 30 minutes on 5 or more days each week.  Do not use any products that contain nicotine or tobacco, such as cigarettes, e-cigarettes, and chewing tobacco. If you need help quitting, ask your health care provider.  If you are sexually active, practice safe sex. Use a condom or other form of protection to prevent STIs (sexually transmitted infections).  Talk with your health care provider about taking a low-dose aspirin or statin. What's next?  Visit your health care provider once a year for a well check visit.  Ask your health care provider how often you should have your eyes and teeth checked.  Stay up to date on all vaccines. This information is not intended to replace advice given to you by your health care provider. Make sure you discuss any questions you have with your health care provider. Document Revised: 10/27/2018 Document Reviewed: 10/27/2018 Elsevier Patient  Education  2020 [employer].

## 2020-03-19 NOTE — Progress Notes (Signed)
Subjective:    Patient ID: Brian Mullen, male    DOB: Apr 17, 1946, 74 y.o.   MRN: [id number]  HPI  This visit occurred during the SARS-CoV-2 public health emergency.  Safety protocols were in place, including screening questions prior to the visit, additional usage of staff PPE, and extensive cleaning of exam room while observing appropriate contact time as indicated for disinfecting solutions.   Brian Mullen is a 74 year old male who presents today for complete physical. He is also needing medication refills and would like to   Immunizations: -Tetanus: Completed in 2014 -Influenza: Completed this season -Shingles: Completed Zostavax in 2018 -Pneumonia: Completed last in 2020 -Covid-19: Has not completed.  Diet: He endorses a poor diet.  Exercise: He is not exercising due to lower extremity pain  Eye exam: Due soon. Dental exam: No recent exam  Colonoscopy: Negative in 2019, due in 2022 PSA: 2.52 in 2020 Hep C Screen: Negative  BP Readings from Last 3 Encounters:  03/19/20 126/84  11/27/19 132/70  10/30/19 127/69   The 10-year ASCVD risk score Mikey Bussing DC Jr., et al., 2013) is: 41.4%   Values used to calculate the score:     Age: 40 years     Sex: Male     Is Non-Hispanic African American: No     Diabetic: Yes     Tobacco smoker: No     Systolic Blood Pressure: 062 mmHg     Is BP treated: Yes     HDL Cholesterol: 42 mg/dL     Total Cholesterol: 134 mg/dL   Review of Systems  Constitutional: Negative for unexpected weight change.  HENT: Negative for rhinorrhea.   Respiratory: Negative for shortness of breath.   Cardiovascular: Negative for chest pain.  Gastrointestinal: Negative for constipation and diarrhea.  Genitourinary: Negative for difficulty urinating.  Musculoskeletal: Positive for arthralgias and back pain.  Skin: Negative for rash.  Allergic/Immunologic: Positive for environmental allergies.  Neurological: Negative for dizziness and headaches.    Psychiatric/Behavioral: Positive for sleep disturbance. The patient is not nervous/anxious.        Past Medical History:  Diagnosis Date  . Allergy   . Asthma   . Diabetes mellitus without complication ([place])   . GERD (gastroesophageal reflux disease)   . Hypertension      Social History   Socioeconomic History  . Marital status: Married    Spouse name: Not on file  . Number of children: Not on file  . Years of education: Not on file  . Highest education level: Not on file  Occupational History  . Not on file  Tobacco Use  . Smoking status: Former Research scientist (life sciences)  . Smokeless tobacco: Never Used  Substance and Sexual Activity  . Alcohol use: Yes    Alcohol/week: 1.0 - 2.0 standard drinks    Types: 1 - 2 Standard drinks or equivalent per week    Comment: occ  . Drug use: No  . Sexual activity: Not on file  Other Topics Concern  . Not on file  Social History Narrative   Married.   No children   Retried. Worked as a Furniture conservator/restorer.   Enjoys working on [employer], yard work, gardening.   Social Determinants of Health   Financial Resource Strain:   . Difficulty of Paying Living Expenses:   Food Insecurity:   . Worried About Charity fundraiser in the Last Year:   . [place] in the Last Year:  Transportation Needs:   . Film/video editor (Medical):   Marland Kitchen Lack of Transportation (Non-Medical):   Physical Activity:   . Days of Exercise per Week:   . Minutes of Exercise per Session:   Stress:   . Feeling of Stress :   Social Connections:   . Frequency of Communication with Friends and Family:   . Frequency of Social Gatherings with Friends and Family:   . Attends Religious Services:   . Active Member of Clubs or Organizations:   . Attends Archivist Meetings:   Marland Kitchen Marital Status:   Intimate Partner Violence:   . Fear of Current or Ex-Partner:   . Emotionally Abused:   Marland Kitchen Physically Abused:   . Sexually Abused:     Past Surgical History:   Procedure Laterality Date  . ELBOW SURGERY    . KNEE SURGERY Right   . TONSILLECTOMY      Family History  Problem Relation Age of Onset  . Pulmonary embolism Father 23       Deceased  . Heart attack Mother 73       Deceased  . Diabetes Maternal Grandmother   . Heart attack Sister     Allergies  Allergen Reactions  . Coconut Oil Anaphylaxis  . Iodine Anaphylaxis  . Shellfish Allergy Anaphylaxis  . Shellfish-Derived Products Anaphylaxis    Current Outpatient Medications on File Prior to Visit  Medication Sig Dispense Refill  . ACCU-CHEK AVIVA PLUS test strip USE AS INSTRUCTED TO TEST BLOOD SUGAR 2 TIMES DAILY 100 strip 2  . aspirin 81 MG tablet Take 81 mg by mouth daily.    . Blood Glucose Monitoring Suppl (ONE TOUCH ULTRA 2) w/Device KIT Use as instructed to blood sugar 2 times daily. 1 each 0  . fexofenadine (ALLEGRA) 180 MG tablet Take 180 mg by mouth daily.    . Lancets (ONETOUCH ULTRASOFT) lancets Use as instructed to test blood sugar 2 times daily. 100 each 5  . losartan (COZAAR) 25 MG tablet TAKE 1 TABLET BY MOUTH EVERY DAY 90 tablet 1  . metFORMIN (GLUCOPHAGE) 500 MG tablet Take 2 tablets (1,000 mg total) by mouth 2 (two) times daily with a meal. 360 tablet 2  . metoprolol succinate (TOPROL-XL) 25 MG 24 hr tablet Take 1 tablet (25 mg total) by mouth daily. for high blood pressure 90 tablet 2  . montelukast (SINGULAIR) 10 MG tablet TAKE 1 TABLET BY MOUTH EVERYDAY AT BEDTIME 90 tablet 2   No current facility-administered medications on file prior to visit.    BP 126/84   Pulse 82   Temp (!) 96 F (35.6 C) (Temporal)   Ht _0  (1.676 m)   Wt 179 lb 8 oz (81.4 kg)   SpO2 98%   BMI 28.97 kg/m    Objective:   Physical Exam  Constitutional: He is oriented to person, place, and time. He appears well-nourished.  HENT:  Right Ear: Tympanic membrane and ear canal normal.  Left Ear: Tympanic membrane and ear canal normal.  Mouth/Throat: Oropharynx is clear and  moist.  Eyes: Pupils are equal, round, and reactive to light. EOM are normal.  Cardiovascular: Normal rate and regular rhythm.  Respiratory: Effort normal and breath sounds normal.  GI: Soft. Bowel sounds are normal. There is no abdominal tenderness.  Musculoskeletal:        General: Normal range of motion.     Cervical back: Neck supple.  Neurological: He is alert and oriented to person, place,  and time. No cranial nerve deficit.  Reflex Scores:      Patellar reflexes are 2+ on the right side and 2+ on the left side. Skin: Skin is warm and dry.  Psychiatric: He has a normal mood and affect.           Assessment & Plan:

## 2020-03-19 NOTE — Assessment & Plan Note (Signed)
Immunizations UTD, does not wish to get Covid vaccines. PSA UTD. Colon cancer screening UTD. Encouraged a healthy diet and regular exercise. Exam today stable. Labs reviewed.

## 2020-03-19 NOTE — Assessment & Plan Note (Addendum)
Increase in A1C to 7.0 from last check. Also not taking Metformin consistently as he doesn't eat full meals. Discussed to take daily as prescribed, even with a light snack.   Managed on ARB. Cannot tolerate statin due to myalgias. Will trial Crestor. Pneumonia vaccination UTD. Follow up in 6 months.

## 2020-03-19 NOTE — Assessment & Plan Note (Signed)
ASCVD risk score of 41%. Cannot tolerate simvastatin. Will trial low dose Crestor. He will update. Consider Zetia if he cannot tolerate.  

## 2020-03-19 NOTE — Assessment & Plan Note (Signed)
Overall controlled on PRN Ventolin during seasonal changes. No longer using Spiriva. Continue Singulair and Fexofenadine.

## 2020-03-19 NOTE — Assessment & Plan Note (Deleted)
ASCVD risk score of 41%. Cannot tolerate simvastatin. Will trial low dose Crestor. He will update. Consider Zetia if he cannot tolerate.

## 2020-03-19 NOTE — Assessment & Plan Note (Signed)
Chronic and intermittent, mostly at night when sleeping. Also with difficulty getting legs comfortable at night. Rx for Tizanidine sent to pharmacy.   He was a smoker for about 10 years, stopped at age 74. He will update.

## 2020-04-14 ENCOUNTER — Other Ambulatory Visit: Payer: Self-pay | Admitting: Primary Care

## 2020-04-24 ENCOUNTER — Telehealth: Payer: Self-pay

## 2020-04-24 ENCOUNTER — Ambulatory Visit: Payer: Medicare HMO

## 2020-04-24 NOTE — Telephone Encounter (Signed)
Called patient 3 times trying to complete his Medicare visit. Patient never answered. Left message notifying patient that appointment was cancelled and to call and reschedule.

## 2020-04-30 ENCOUNTER — Telehealth: Payer: Self-pay

## 2020-04-30 ENCOUNTER — Ambulatory Visit: Payer: Medicare HMO

## 2020-04-30 NOTE — Telephone Encounter (Signed)
Called patient 3 times attempting to complete his Medicare visit. Patient never answered. Left message on voicemail notifying patient appointment will be cancelled and to call the office to reschedule.

## 2020-05-10 ENCOUNTER — Telehealth: Payer: Self-pay

## 2020-05-10 NOTE — Telephone Encounter (Signed)
Please notify patient that I strongly recommend the Covid vaccine series, especially given his medical history. Any manufacturer is fine.

## 2020-05-10 NOTE — Telephone Encounter (Signed)
Patient contacted the office and states that he is wondering if the COVID vaccine would be safe for him to get? He states he just wants to verify that it is okay, given his current health. [name], please advise.

## 2020-05-10 NOTE — Telephone Encounter (Signed)
Spoke to pt and notified him of recommendation via phone.

## 2020-05-11 DIAGNOSIS — Z0100 Encounter for examination of eyes and vision without abnormal findings: Secondary | ICD-10-CM | POA: Diagnosis not present

## 2020-05-11 DIAGNOSIS — E119 Type 2 diabetes mellitus without complications: Secondary | ICD-10-CM | POA: Diagnosis not present

## 2020-05-21 ENCOUNTER — Other Ambulatory Visit: Payer: Self-pay | Admitting: Primary Care

## 2020-05-21 DIAGNOSIS — E7800 Pure hypercholesterolemia, unspecified: Secondary | ICD-10-CM

## 2020-06-04 ENCOUNTER — Other Ambulatory Visit: Payer: Self-pay | Admitting: Primary Care

## 2020-07-04 DIAGNOSIS — Z20822 Contact with and (suspected) exposure to covid-19: Secondary | ICD-10-CM | POA: Diagnosis not present

## 2020-07-07 ENCOUNTER — Ambulatory Visit
Admission: EM | Admit: 2020-07-07 | Discharge: 2020-07-07 | Disposition: A | Discharge status: Home / Self Care | Payer: Medicare HMO | Attending: Family Medicine | Admitting: Family Medicine

## 2020-07-07 ENCOUNTER — Other Ambulatory Visit: Payer: Self-pay

## 2020-07-07 DIAGNOSIS — R059 Cough, unspecified: Secondary | ICD-10-CM

## 2020-07-07 DIAGNOSIS — J4540 Moderate persistent asthma, uncomplicated: Secondary | ICD-10-CM

## 2020-07-07 DIAGNOSIS — R05 Cough: Secondary | ICD-10-CM

## 2020-07-07 DIAGNOSIS — Z1152 Encounter for screening for COVID-19: Secondary | ICD-10-CM | POA: Diagnosis not present

## 2020-07-07 DIAGNOSIS — R0981 Nasal congestion: Secondary | ICD-10-CM

## 2020-07-07 DIAGNOSIS — R062 Wheezing: Secondary | ICD-10-CM

## 2020-07-07 DIAGNOSIS — R6889 Other general symptoms and signs: Secondary | ICD-10-CM | POA: Diagnosis not present

## 2020-07-07 DIAGNOSIS — J209 Acute bronchitis, unspecified: Secondary | ICD-10-CM | POA: Diagnosis not present

## 2020-07-07 MED ORDER — ALBUTEROL SULFATE HFA 108 (90 BASE) MCG/ACT IN AERS: 2.0000 | INHALATION_SPRAY | RESPIRATORY_TRACT | 0 refills | Status: DC | PRN

## 2020-07-07 MED ORDER — PREDNISONE 10 MG (21) PO TBPK: ORAL_TABLET | Freq: Every day | ORAL | 0 refills | Status: AC

## 2020-07-07 NOTE — ED Triage Notes (Signed)
Pt has had cold symptoms for past week, negative covid test yesterday

## 2020-07-07 NOTE — Discharge Instructions (Signed)
Your COVID test is pending.  You should self quarantine until the test result is back.    Take Tylenol as needed for fever or discomfort.  Rest and keep yourself hydrated.    Go to the emergency department if you develop acute worsening symptoms.     

## 2020-07-08 LAB — NOVEL CORONAVIRUS, NAA: SARS-CoV-2, NAA: NOT DETECTED

## 2020-07-08 LAB — SARS-COV-2, NAA 2 DAY TAT

## 2020-07-09 NOTE — ED Provider Notes (Signed)
Brian Mullen   [id number] 07/07/20 Arrival Time: 0300   CC: COVID symptoms  SUBJECTIVE: History from: patient.  Brian Mullen is a 74 y.o. male who presents with abrupt onset of nasal congestion, PND, and persistent dry cough for the last week. Reports negative Rapid covid test yesterday. Reports that his wife is sick, but does not have current diagnosis. Denies recent travel. Has negative history of Covid. Has not completed Covid vaccines. Has not taken OTC medications for this. There are no aggravating or alleviating factors. Denies previous symptoms in the past. Denies fever, chills, fatigue, sinus pain, rhinorrhea, sore throat, SOB, wheezing, chest pain, nausea, changes in bowel or bladder habits.    ROS: As per HPI.  All other pertinent ROS negative.     Past Medical History:  Diagnosis Date  . Allergy   . Asthma   . Diabetes mellitus without complication ([place])   . GERD (gastroesophageal reflux disease)   . Hypertension    Past Surgical History:  Procedure Laterality Date  . ELBOW SURGERY    . KNEE SURGERY Right   . TONSILLECTOMY     Allergies  Allergen Reactions  . Coconut Oil Anaphylaxis  . Iodine Anaphylaxis  . Shellfish Allergy Anaphylaxis  . Shellfish-Derived Products Anaphylaxis   No current facility-administered medications on file prior to encounter.   Current Outpatient Medications on File Prior to Encounter  Medication Sig Dispense Refill  . ACCU-CHEK AVIVA PLUS test strip USE AS INSTRUCTED TO TEST BLOOD SUGAR 2 TIMES DAILY 100 strip 2  . aspirin 81 MG tablet Take 81 mg by mouth daily.    . Blood Glucose Monitoring Suppl (ONE TOUCH ULTRA 2) w/Device KIT Use as instructed to blood sugar 2 times daily. 1 each 0  . fexofenadine (ALLEGRA) 180 MG tablet Take 180 mg by mouth daily.    . Lancets (ONETOUCH ULTRASOFT) lancets Use as instructed to test blood sugar 2 times daily. 100 each 5  . losartan (COZAAR) 25 MG tablet TAKE 1 TABLET BY MOUTH EVERY DAY 90  tablet 1  . metFORMIN (GLUCOPHAGE) 500 MG tablet TAKE 2 TABLETS (1,000 MG TOTAL) BY MOUTH 2 (TWO) TIMES DAILY WITH A MEAL. 360 tablet 1  . metoprolol succinate (TOPROL-XL) 25 MG 24 hr tablet Take 1 tablet (25 mg total) by mouth daily. for high blood pressure 90 tablet 2  . montelukast (SINGULAIR) 10 MG tablet TAKE 1 TABLET BY MOUTH EVERYDAY AT BEDTIME 90 tablet 2  . rosuvastatin (CRESTOR) 5 MG tablet TAKE 1 TABLET (5 MG TOTAL) BY MOUTH EVERY EVENING. FOR CHOLESTEROL. 90 tablet 1  . tiZANidine (ZANAFLEX) 4 MG tablet Take 1 tablet (4 mg total) by mouth at bedtime as needed for muscle spasms. 30 tablet 0   Social History   Socioeconomic History  . Marital status: Married    Spouse name: Not on file  . Number of children: Not on file  . Years of education: Not on file  . Highest education level: Not on file  Occupational History  . Not on file  Tobacco Use  . Smoking status: Former Research scientist (life sciences)  . Smokeless tobacco: Never Used  Vaping Use  . Vaping Use: Never used  Substance and Sexual Activity  . Alcohol use: Yes    Alcohol/week: 1.0 - 2.0 standard drink    Types: 1 - 2 Standard drinks or equivalent per week    Comment: occ  . Drug use: No  . Sexual activity: Not on file  Other Topics Concern  .  Not on file  Social History Narrative   Married.   No children   Retried. Worked as a Furniture conservator/restorer.   Enjoys working on [employer], yard work, gardening.   Social Determinants of Health   Financial Resource Strain:   . Difficulty of Paying Living Expenses: Not on file  Food Insecurity:   . Worried About Charity fundraiser in the Last Year: Not on file  . Ran Out of Food in the Last Year: Not on file  Transportation Needs:   . Lack of Transportation (Medical): Not on file  . Lack of Transportation (Non-Medical): Not on file  Physical Activity:   . Days of Exercise per Week: Not on file  . Minutes of Exercise per Session: Not on file  Stress:   . Feeling of Stress : Not on  file  Social Connections:   . Frequency of Communication with Friends and Family: Not on file  . Frequency of Social Gatherings with Friends and Family: Not on file  . Attends Religious Services: Not on file  . Active Member of Clubs or Organizations: Not on file  . Attends Archivist Meetings: Not on file  . Marital Status: Not on file  Intimate Partner Violence:   . Fear of Current or Ex-Partner: Not on file  . Emotionally Abused: Not on file  . Physically Abused: Not on file  . Sexually Abused: Not on file   Family History  Problem Relation Age of Onset  . Pulmonary embolism Father 53       Deceased  . Heart attack Mother 31       Deceased  . Diabetes Maternal Grandmother   . Heart attack Sister     OBJECTIVE:  Vitals:   07/07/20 1029  BP: 137/74  Pulse: 70  Resp: 20  Temp: 98.2 F (36.8 C)  SpO2: 98%     General appearance: alert; appears fatigued, but nontoxic; speaking in full sentences and tolerating own secretions HEENT: NCAT; Ears: EACs clear, TMs pearly gray; Eyes: PERRL.  EOM grossly intact. Sinuses: nontender; Nose: nares patent without rhinorrhea, Throat: oropharynx clear, tonsils non erythematous or enlarged, uvula midline  Neck: supple without LAD Lungs: unlabored respirations, symmetrical air entry; cough: mild; no respiratory distress; diffuse wheezing throughout bilateral lung fields Heart: regular rate and rhythm.  Radial pulses 2+ symmetrical bilaterally Skin: warm and dry Psychological: alert and cooperative; normal mood and affect  LABS:  No results found for this or any previous visit (from the past 24 hour(s)).   ASSESSMENT & PLAN:  1. Acute bronchitis, unspecified organism   2. Cough   3. Nasal congestion   4. Encounter for screening for COVID-19   5. Moderate persistent asthma without complication   6. Wheezing     Meds ordered this encounter  Medications  . predniSONE (STERAPRED UNI-PAK 21 TAB) 10 MG (21) TBPK tablet     Sig: Take by mouth daily for 6 days. Take 6 tablets on day 1, 5 tablets on day 2, 4 tablets on day 3, 3 tablets on day 4, 2 tablets on day 5, 1 tablet on day 6    Dispense:  21 tablet    Refill:  0    Order Specific Question:   Supervising Provider    Answer:   Chase Picket [id number]  . albuterol (VENTOLIN HFA) 108 (90 Base) MCG/ACT inhaler    Sig: Inhale 2 puffs into the lungs every 4 (four) hours as needed  for wheezing or shortness of breath.    Dispense:  18 g    Refill:  0    Order Specific Question:   Supervising Provider    Answer:   Chase Picket [id number]   Prescribed prednisone taper Prescribed albuterol inhaler   COVID testing ordered.  It will take between 1-2 days for test results.  Someone will contact you regarding abnormal results.    Patient should remain in quarantine until they have received Covid results.  If negative you may resume normal activities (go back to work/school) while practicing hand hygiene, social distance, and mask wearing.  If positive, patient should remain in quarantine for 10 days from symptom onset AND greater than 72 hours after symptoms resolution (absence of fever without the use of fever-reducing medication and improvement in respiratory symptoms), whichever is longer Get plenty of rest and push fluids Use OTC zyrtec for nasal congestion, runny nose, and/or sore throat Use OTC flonase for nasal congestion and runny nose Use medications daily for symptom relief Use OTC medications like ibuprofen or tylenol as needed fever or pain Call or go to the ED if you have any new or worsening symptoms such as fever, worsening cough, shortness of breath, chest tightness, chest pain, turning blue, changes in mental status.  Reviewed expectations re: course of current medical issues. Questions answered. Outlined signs and symptoms indicating need for more acute intervention. Patient verbalized understanding. After Visit Summary given.           Faustino Congress, NP 07/09/20 [phone]

## 2020-07-19 ENCOUNTER — Other Ambulatory Visit: Payer: Self-pay | Admitting: *Deleted

## 2020-07-19 DIAGNOSIS — J4540 Moderate persistent asthma, uncomplicated: Secondary | ICD-10-CM

## 2020-07-19 DIAGNOSIS — E119 Type 2 diabetes mellitus without complications: Secondary | ICD-10-CM

## 2020-07-19 MED ORDER — ACCU-CHEK AVIVA PLUS VI STRP: ORAL_STRIP | 2 refills | Status: DC

## 2020-07-19 MED ORDER — ALBUTEROL SULFATE HFA 108 (90 BASE) MCG/ACT IN AERS: 2.0000 | INHALATION_SPRAY | RESPIRATORY_TRACT | 0 refills | Status: DC | PRN

## 2020-07-19 NOTE — Telephone Encounter (Signed)
Noted, refill sent to pharmacy. 

## 2020-07-19 NOTE — Telephone Encounter (Signed)
Please clarify, thas he already filled and used the albuterol inhaler that was provided from the emergency department? This would be a refill too soon if that is the case.

## 2020-07-19 NOTE — Telephone Encounter (Signed)
Patient has the inhaler from 08/22 he just needs refills for future need.

## 2020-07-19 NOTE — Telephone Encounter (Signed)
Patient left a voicemail requesting a refill on Albuterol inhaler. Patient requested that the script be sent to [facility] Last office visit 03/19/20 Last refill 07/07/20-18 G given at ED

## 2020-08-28 DIAGNOSIS — M9901 Segmental and somatic dysfunction of cervical region: Secondary | ICD-10-CM | POA: Diagnosis not present

## 2020-08-28 DIAGNOSIS — M542 Cervicalgia: Secondary | ICD-10-CM | POA: Diagnosis not present

## 2020-08-28 DIAGNOSIS — M546 Pain in thoracic spine: Secondary | ICD-10-CM | POA: Diagnosis not present

## 2020-08-28 DIAGNOSIS — M9902 Segmental and somatic dysfunction of thoracic region: Secondary | ICD-10-CM | POA: Diagnosis not present

## 2020-08-28 DIAGNOSIS — M9903 Segmental and somatic dysfunction of lumbar region: Secondary | ICD-10-CM | POA: Diagnosis not present

## 2020-08-30 DIAGNOSIS — M542 Cervicalgia: Secondary | ICD-10-CM | POA: Diagnosis not present

## 2020-08-30 DIAGNOSIS — M546 Pain in thoracic spine: Secondary | ICD-10-CM | POA: Diagnosis not present

## 2020-08-30 DIAGNOSIS — M9902 Segmental and somatic dysfunction of thoracic region: Secondary | ICD-10-CM | POA: Diagnosis not present

## 2020-08-30 DIAGNOSIS — M9901 Segmental and somatic dysfunction of cervical region: Secondary | ICD-10-CM | POA: Diagnosis not present

## 2020-08-30 DIAGNOSIS — M9903 Segmental and somatic dysfunction of lumbar region: Secondary | ICD-10-CM | POA: Diagnosis not present

## 2020-09-02 DIAGNOSIS — M9902 Segmental and somatic dysfunction of thoracic region: Secondary | ICD-10-CM | POA: Diagnosis not present

## 2020-09-02 DIAGNOSIS — M9903 Segmental and somatic dysfunction of lumbar region: Secondary | ICD-10-CM | POA: Diagnosis not present

## 2020-09-02 DIAGNOSIS — M9901 Segmental and somatic dysfunction of cervical region: Secondary | ICD-10-CM | POA: Diagnosis not present

## 2020-09-02 DIAGNOSIS — M546 Pain in thoracic spine: Secondary | ICD-10-CM | POA: Diagnosis not present

## 2020-09-02 DIAGNOSIS — M542 Cervicalgia: Secondary | ICD-10-CM | POA: Diagnosis not present

## 2020-09-06 ENCOUNTER — Other Ambulatory Visit: Payer: Self-pay

## 2020-09-06 ENCOUNTER — Ambulatory Visit (INDEPENDENT_AMBULATORY_CARE_PROVIDER_SITE_OTHER): Payer: Medicare HMO | Admitting: Primary Care

## 2020-09-06 ENCOUNTER — Encounter: Payer: Self-pay | Admitting: Primary Care

## 2020-09-06 ENCOUNTER — Ambulatory Visit (INDEPENDENT_AMBULATORY_CARE_PROVIDER_SITE_OTHER)
Admission: RE | Admit: 2020-09-06 | Discharge: 2020-09-06 | Disposition: A | Discharge status: Home / Self Care | Payer: Medicare HMO | Source: Ambulatory Visit | Attending: Primary Care | Admitting: Primary Care

## 2020-09-06 VITALS — BP 118/74 | HR 68 | Temp 97.9°F | Ht 66.0 in | Wt 194.0 lb

## 2020-09-06 DIAGNOSIS — G8929 Other chronic pain: Secondary | ICD-10-CM | POA: Diagnosis not present

## 2020-09-06 DIAGNOSIS — M546 Pain in thoracic spine: Secondary | ICD-10-CM

## 2020-09-06 DIAGNOSIS — E119 Type 2 diabetes mellitus without complications: Secondary | ICD-10-CM

## 2020-09-06 DIAGNOSIS — R252 Cramp and spasm: Secondary | ICD-10-CM

## 2020-09-06 DIAGNOSIS — M47814 Spondylosis without myelopathy or radiculopathy, thoracic region: Secondary | ICD-10-CM | POA: Diagnosis not present

## 2020-09-06 DIAGNOSIS — J4540 Moderate persistent asthma, uncomplicated: Secondary | ICD-10-CM

## 2020-09-06 LAB — POCT GLYCOSYLATED HEMOGLOBIN (HGB A1C): Hemoglobin A1C: 6.7 % — AB (ref 4.0–5.6)

## 2020-09-06 MED ORDER — SPIRIVA HANDIHALER 18 MCG IN CAPS: 18.0000 ug | ORAL_CAPSULE | Freq: Every day | RESPIRATORY_TRACT | 2 refills | Status: DC

## 2020-09-06 MED ORDER — TIZANIDINE HCL 4 MG PO TABS: 4.0000 mg | ORAL_TABLET | Freq: Three times a day (TID) | ORAL | 0 refills | Status: DC | PRN

## 2020-09-06 NOTE — Assessment & Plan Note (Signed)
Increased symptoms recently due to seasonal changes. Did well on Spiriva in the past (prescribed by old PCP).   No wheezing on exam. Rx sent for Spiriva for him to use daily during seasonal changes. Continue Singulair.

## 2020-09-06 NOTE — Progress Notes (Signed)
Subjective:    Patient ID: Brian Mullen, male    DOB: 1946/06/25, 74 y.o.   MRN: [id number]  HPI  This visit occurred during the SARS-CoV-2 public health emergency.  Safety protocols were in place, including screening questions prior to the visit, additional usage of staff PPE, and extensive cleaning of exam room while observing appropriate contact time as indicated for disinfecting solutions.   Brian Mullen is a 74 year old male with a history of type 2 diabetes, COPD, hypertension, hyperlipidemia, tobacco abuse who presents today for medication refill and follow up of diabetes.  1) Tobacco Abuse/Asthma: Prior history, quit in 1969. His PCP in [place] prescribed him Spiriva which helped with breathing for asthma. Recently he's noticed wheezing in the morning with shortness of breath upon moderate exertion. He is using albuterol inhaler several times weekly with improvement.   He's been using his old Spiriva four times over the last 6 weeks. Typically he uses his Spiriva consistently during changes in seasons.   2) Type 2 Diabetes:   Current medications include: Metformin 500 mg BID.  He is checking his blood glucose on occasion when he feels "crummy" and is getting readings of 130's-140's.   Last A1C: 7.0 in April 2021, 6.7 today. Last Eye Exam: Completed in 2021 Last Foot Exam:  Due Pneumonia Vaccination: Completed in 2020 ACE/ARB: Losartan  Statin: Crestor  3) Chronic Back Pain: Chronic to lumbar spine with intermittent flares. Recent flare began about one week ago after repetitive lifting of heavy cinder blocks. He typically goes to the chiropractor, but hasn't been in to see him in a few months. His pain is mostly located to the right thoracic back which feels like muscle tightness.    BP Readings from Last 3 Encounters:  09/06/20 118/74  07/07/20 137/74  03/19/20 126/84     Review of Systems  Eyes: Negative for visual disturbance.  Respiratory: Negative for shortness of  breath.   Cardiovascular: Negative for chest pain.  Musculoskeletal: Positive for back pain and myalgias.  Neurological: Negative for dizziness and headaches.       Past Medical History:  Diagnosis Date  . Allergy   . Asthma   . Diabetes mellitus without complication ([place])   . GERD (gastroesophageal reflux disease)   . Hypertension      Social History   Socioeconomic History  . Marital status: Married    Spouse name: Not on file  . Number of children: Not on file  . Years of education: Not on file  . Highest education level: Not on file  Occupational History  . Not on file  Tobacco Use  . Smoking status: Former Research scientist (life sciences)  . Smokeless tobacco: Never Used  Vaping Use  . Vaping Use: Never used  Substance and Sexual Activity  . Alcohol use: Yes    Alcohol/week: 1.0 - 2.0 standard drink    Types: 1 - 2 Standard drinks or equivalent per week    Comment: occ  . Drug use: No  . Sexual activity: Not on file  Other Topics Concern  . Not on file  Social History Narrative   Married.   No children   Retried. Worked as a Furniture conservator/restorer.   Enjoys working on [employer], yard work, gardening.   Social Determinants of Health   Financial Resource Strain:   . Difficulty of Paying Living Expenses: Not on file  Food Insecurity:   . Worried About Charity fundraiser in the Last  Year: Not on file  . Ran Out of Food in the Last Year: Not on file  Transportation Needs:   . Lack of Transportation (Medical): Not on file  . Lack of Transportation (Non-Medical): Not on file  Physical Activity:   . Days of Exercise per Week: Not on file  . Minutes of Exercise per Session: Not on file  Stress:   . Feeling of Stress : Not on file  Social Connections:   . Frequency of Communication with Friends and Family: Not on file  . Frequency of Social Gatherings with Friends and Family: Not on file  . Attends Religious Services: Not on file  . Active Member of Clubs or Organizations: Not  on file  . Attends Archivist Meetings: Not on file  . Marital Status: Not on file  Intimate Partner Violence:   . Fear of Current or Ex-Partner: Not on file  . Emotionally Abused: Not on file  . Physically Abused: Not on file  . Sexually Abused: Not on file    Past Surgical History:  Procedure Laterality Date  . ELBOW SURGERY    . KNEE SURGERY Right   . TONSILLECTOMY      Family History  Problem Relation Age of Onset  . Pulmonary embolism Father 78       Deceased  . Heart attack Mother 22       Deceased  . Diabetes Maternal Grandmother   . Heart attack Sister     Allergies  Allergen Reactions  . Coconut Oil Anaphylaxis  . Iodine Anaphylaxis  . Shellfish Allergy Anaphylaxis  . Shellfish-Derived Products Anaphylaxis    Current Outpatient Medications on File Prior to Visit  Medication Sig Dispense Refill  . albuterol (VENTOLIN HFA) 108 (90 Base) MCG/ACT inhaler Inhale 2 puffs into the lungs every 4 (four) hours as needed for wheezing or shortness of breath. 18 g 0  . aspirin 81 MG tablet Take 81 mg by mouth daily.    . Blood Glucose Monitoring Suppl (ONE TOUCH ULTRA 2) w/Device KIT Use as instructed to blood sugar 2 times daily. 1 each 0  . fexofenadine (ALLEGRA) 180 MG tablet Take 180 mg by mouth daily.    Marland Kitchen glucose blood (ACCU-CHEK AVIVA PLUS) test strip USE AS INSTRUCTED TO TEST BLOOD SUGAR 2 TIMES DAILY 100 strip 2  . Lancets (ONETOUCH ULTRASOFT) lancets Use as instructed to test blood sugar 2 times daily. 100 each 5  . losartan (COZAAR) 25 MG tablet TAKE 1 TABLET BY MOUTH EVERY DAY 90 tablet 1  . metFORMIN (GLUCOPHAGE) 500 MG tablet TAKE 2 TABLETS (1,000 MG TOTAL) BY MOUTH 2 (TWO) TIMES DAILY WITH A MEAL. 360 tablet 1  . metoprolol succinate (TOPROL-XL) 25 MG 24 hr tablet Take 1 tablet (25 mg total) by mouth daily. for high blood pressure 90 tablet 2  . montelukast (SINGULAIR) 10 MG tablet TAKE 1 TABLET BY MOUTH EVERYDAY AT BEDTIME 90 tablet 2  .  rosuvastatin (CRESTOR) 5 MG tablet TAKE 1 TABLET (5 MG TOTAL) BY MOUTH EVERY EVENING. FOR CHOLESTEROL. 90 tablet 1  . tiZANidine (ZANAFLEX) 4 MG tablet Take 1 tablet (4 mg total) by mouth at bedtime as needed for muscle spasms. 30 tablet 0   No current facility-administered medications on file prior to visit.    BP 118/74   Pulse 68   Temp 97.9 F (36.6 C) (Temporal)   Ht 5' 6"  (1.676 m)   Wt 194 lb (88 kg)   SpO2 98%  BMI 31.31 kg/m    Objective:   Physical Exam Cardiovascular:     Rate and Rhythm: Normal rate and regular rhythm.  Pulmonary:     Effort: Pulmonary effort is normal.     Breath sounds: Normal breath sounds.  Musculoskeletal:     Cervical back: Neck supple.     Thoracic back: No tenderness or bony tenderness. Decreased range of motion.     Comments: Decrease in ROM to thoracic spine, stiff when initially standing.  Skin:    General: Skin is warm and dry.            Assessment & Plan:

## 2020-09-06 NOTE — Assessment & Plan Note (Signed)
Intermittent with over exertion, chronic for years.  Plain films of the thoracic spine pending to rule out compression fracture. This does seem to be muscular in nature.   Discussed application of heat, topical analgesia, stretching. Continue Tizanidine, can increase to 8 mg HS, drowsiness precautions provided.

## 2020-09-06 NOTE — Assessment & Plan Note (Signed)
Well controlled in the office today with A1C of 6.7. Continue Metformin 500 mg BID.  Foot exam today. Managed on statin and ARB. Pneumonia vaccine UTD. Eye exam UTD.  Follow up in 6 months.

## 2020-09-06 NOTE — Patient Instructions (Signed)
Complete xray(s) prior to leaving today. I will notify you of your results once received.  You may take 1 to 2 tablets of the tizanidine every 8 hours as needed for muscle spasms. This may cause drowsiness.   Please schedule a physical to meet with me in May 2022.  It was a pleasure to see you today!

## 2020-09-11 ENCOUNTER — Other Ambulatory Visit: Payer: Self-pay | Admitting: Primary Care

## 2020-09-11 DIAGNOSIS — R252 Cramp and spasm: Secondary | ICD-10-CM

## 2020-09-11 DIAGNOSIS — G8929 Other chronic pain: Secondary | ICD-10-CM

## 2020-09-17 ENCOUNTER — Other Ambulatory Visit: Payer: Self-pay | Admitting: Primary Care

## 2020-09-17 DIAGNOSIS — E119 Type 2 diabetes mellitus without complications: Secondary | ICD-10-CM

## 2020-09-19 ENCOUNTER — Ambulatory Visit: Payer: Medicare HMO | Admitting: Primary Care

## 2020-09-30 ENCOUNTER — Other Ambulatory Visit: Payer: Self-pay | Admitting: Primary Care

## 2020-09-30 DIAGNOSIS — E7800 Pure hypercholesterolemia, unspecified: Secondary | ICD-10-CM

## 2020-10-22 ENCOUNTER — Telehealth: Payer: Self-pay | Admitting: Primary Care

## 2020-10-22 NOTE — Telephone Encounter (Signed)
LVM for pt to rtn my call to schedule AWV with [facility].  

## 2020-10-22 NOTE — Telephone Encounter (Signed)
Called patient let know that he is not due for A1c last done at October visit.

## 2020-10-22 NOTE — Telephone Encounter (Signed)
Patient called . Is he due for a A1C recheck? He received a call to schedule. EM

## 2020-11-11 ENCOUNTER — Other Ambulatory Visit: Payer: Self-pay | Admitting: Primary Care

## 2020-11-11 DIAGNOSIS — I10 Essential (primary) hypertension: Secondary | ICD-10-CM

## 2021-01-04 ENCOUNTER — Other Ambulatory Visit: Payer: Self-pay | Admitting: Primary Care

## 2021-01-08 DIAGNOSIS — M9902 Segmental and somatic dysfunction of thoracic region: Secondary | ICD-10-CM | POA: Diagnosis not present

## 2021-01-08 DIAGNOSIS — M546 Pain in thoracic spine: Secondary | ICD-10-CM | POA: Diagnosis not present

## 2021-01-08 DIAGNOSIS — M542 Cervicalgia: Secondary | ICD-10-CM | POA: Diagnosis not present

## 2021-01-08 DIAGNOSIS — M9903 Segmental and somatic dysfunction of lumbar region: Secondary | ICD-10-CM | POA: Diagnosis not present

## 2021-01-08 DIAGNOSIS — M62830 Muscle spasm of back: Secondary | ICD-10-CM | POA: Diagnosis not present

## 2021-01-08 DIAGNOSIS — M9901 Segmental and somatic dysfunction of cervical region: Secondary | ICD-10-CM | POA: Diagnosis not present

## 2021-01-10 DIAGNOSIS — M62830 Muscle spasm of back: Secondary | ICD-10-CM | POA: Diagnosis not present

## 2021-01-10 DIAGNOSIS — M9902 Segmental and somatic dysfunction of thoracic region: Secondary | ICD-10-CM | POA: Diagnosis not present

## 2021-01-10 DIAGNOSIS — M9901 Segmental and somatic dysfunction of cervical region: Secondary | ICD-10-CM | POA: Diagnosis not present

## 2021-01-10 DIAGNOSIS — M542 Cervicalgia: Secondary | ICD-10-CM | POA: Diagnosis not present

## 2021-01-10 DIAGNOSIS — M9903 Segmental and somatic dysfunction of lumbar region: Secondary | ICD-10-CM | POA: Diagnosis not present

## 2021-01-10 DIAGNOSIS — M546 Pain in thoracic spine: Secondary | ICD-10-CM | POA: Diagnosis not present

## 2021-01-13 DIAGNOSIS — M9903 Segmental and somatic dysfunction of lumbar region: Secondary | ICD-10-CM | POA: Diagnosis not present

## 2021-01-13 DIAGNOSIS — M542 Cervicalgia: Secondary | ICD-10-CM | POA: Diagnosis not present

## 2021-01-13 DIAGNOSIS — M9902 Segmental and somatic dysfunction of thoracic region: Secondary | ICD-10-CM | POA: Diagnosis not present

## 2021-01-13 DIAGNOSIS — M9901 Segmental and somatic dysfunction of cervical region: Secondary | ICD-10-CM | POA: Diagnosis not present

## 2021-01-13 DIAGNOSIS — M546 Pain in thoracic spine: Secondary | ICD-10-CM | POA: Diagnosis not present

## 2021-01-13 DIAGNOSIS — M62830 Muscle spasm of back: Secondary | ICD-10-CM | POA: Diagnosis not present

## 2021-01-15 DIAGNOSIS — M9901 Segmental and somatic dysfunction of cervical region: Secondary | ICD-10-CM | POA: Diagnosis not present

## 2021-01-15 DIAGNOSIS — M9902 Segmental and somatic dysfunction of thoracic region: Secondary | ICD-10-CM | POA: Diagnosis not present

## 2021-01-15 DIAGNOSIS — M62830 Muscle spasm of back: Secondary | ICD-10-CM | POA: Diagnosis not present

## 2021-01-15 DIAGNOSIS — M9903 Segmental and somatic dysfunction of lumbar region: Secondary | ICD-10-CM | POA: Diagnosis not present

## 2021-01-15 DIAGNOSIS — M542 Cervicalgia: Secondary | ICD-10-CM | POA: Diagnosis not present

## 2021-01-15 DIAGNOSIS — M546 Pain in thoracic spine: Secondary | ICD-10-CM | POA: Diagnosis not present

## 2021-01-17 DIAGNOSIS — M546 Pain in thoracic spine: Secondary | ICD-10-CM | POA: Diagnosis not present

## 2021-01-17 DIAGNOSIS — M9901 Segmental and somatic dysfunction of cervical region: Secondary | ICD-10-CM | POA: Diagnosis not present

## 2021-01-17 DIAGNOSIS — M62830 Muscle spasm of back: Secondary | ICD-10-CM | POA: Diagnosis not present

## 2021-01-17 DIAGNOSIS — M9903 Segmental and somatic dysfunction of lumbar region: Secondary | ICD-10-CM | POA: Diagnosis not present

## 2021-01-17 DIAGNOSIS — M542 Cervicalgia: Secondary | ICD-10-CM | POA: Diagnosis not present

## 2021-01-17 DIAGNOSIS — M9902 Segmental and somatic dysfunction of thoracic region: Secondary | ICD-10-CM | POA: Diagnosis not present

## 2021-01-20 DIAGNOSIS — M9903 Segmental and somatic dysfunction of lumbar region: Secondary | ICD-10-CM | POA: Diagnosis not present

## 2021-01-20 DIAGNOSIS — M62830 Muscle spasm of back: Secondary | ICD-10-CM | POA: Diagnosis not present

## 2021-01-20 DIAGNOSIS — M546 Pain in thoracic spine: Secondary | ICD-10-CM | POA: Diagnosis not present

## 2021-01-20 DIAGNOSIS — M542 Cervicalgia: Secondary | ICD-10-CM | POA: Diagnosis not present

## 2021-01-20 DIAGNOSIS — M9901 Segmental and somatic dysfunction of cervical region: Secondary | ICD-10-CM | POA: Diagnosis not present

## 2021-01-20 DIAGNOSIS — M9902 Segmental and somatic dysfunction of thoracic region: Secondary | ICD-10-CM | POA: Diagnosis not present

## 2021-01-24 DIAGNOSIS — M542 Cervicalgia: Secondary | ICD-10-CM | POA: Diagnosis not present

## 2021-01-24 DIAGNOSIS — M9901 Segmental and somatic dysfunction of cervical region: Secondary | ICD-10-CM | POA: Diagnosis not present

## 2021-01-24 DIAGNOSIS — M546 Pain in thoracic spine: Secondary | ICD-10-CM | POA: Diagnosis not present

## 2021-01-24 DIAGNOSIS — M62830 Muscle spasm of back: Secondary | ICD-10-CM | POA: Diagnosis not present

## 2021-01-24 DIAGNOSIS — M9902 Segmental and somatic dysfunction of thoracic region: Secondary | ICD-10-CM | POA: Diagnosis not present

## 2021-01-24 DIAGNOSIS — M9903 Segmental and somatic dysfunction of lumbar region: Secondary | ICD-10-CM | POA: Diagnosis not present

## 2021-01-27 DIAGNOSIS — M542 Cervicalgia: Secondary | ICD-10-CM | POA: Diagnosis not present

## 2021-01-27 DIAGNOSIS — M9903 Segmental and somatic dysfunction of lumbar region: Secondary | ICD-10-CM | POA: Diagnosis not present

## 2021-01-27 DIAGNOSIS — M9902 Segmental and somatic dysfunction of thoracic region: Secondary | ICD-10-CM | POA: Diagnosis not present

## 2021-01-27 DIAGNOSIS — M62830 Muscle spasm of back: Secondary | ICD-10-CM | POA: Diagnosis not present

## 2021-01-27 DIAGNOSIS — M9901 Segmental and somatic dysfunction of cervical region: Secondary | ICD-10-CM | POA: Diagnosis not present

## 2021-01-27 DIAGNOSIS — M546 Pain in thoracic spine: Secondary | ICD-10-CM | POA: Diagnosis not present

## 2021-02-08 ENCOUNTER — Other Ambulatory Visit: Payer: Self-pay | Admitting: Primary Care

## 2021-02-08 DIAGNOSIS — J4540 Moderate persistent asthma, uncomplicated: Secondary | ICD-10-CM

## 2021-02-20 ENCOUNTER — Telehealth: Payer: Self-pay

## 2021-02-20 NOTE — Telephone Encounter (Signed)
Noted, agree with advice given 

## 2021-02-20 NOTE — Telephone Encounter (Addendum)
I was unable to reach pt by phone; pt does have appt scheduled with Allayne Gitelman NP on 03/04/21 @ 12 noon with Allayne Gitelman NP; per access note pt is going to be out of town on Thur,Fri and Sat. And symptoms per appt notes going on and off for 2 months. Per access note ED precautions given but pt disagreed. Sending note to Allayne Gitelman NP who is out of office and Pamala Hurry NP who is in office and [place] CMA. Pt called back and pt has seen cardiologist before about his symptoms; pt has been inactive for several months and when pt starts to perspire and then has SOB and fast heart rate. If pt drinks water that symptoms go away and pt thinks related to dehydration. Pt is not stressing and is in no distress. UC & ED precautions given and pt voiced understanding and reviewed appt date and time listed above and pt voiced understanding.

## 2021-02-20 NOTE — Telephone Encounter (Signed)
[facility] Day - Client TELEPHONE ADVICE RECORD [facility] Patient Name: Brian Mullen [place] Gender: Male DOB: April 25, 1946 Age: 75 Y 1 M 25 D Return Phone Number: [phone] (Primary) Address[place] [address] Client [name] Day - Client Client Site [facility] - Day Physician [name] - NP Contact Type Call Who Is Calling Patient / Member / Family / Caregiver Call Type Triage / Clinical Relationship To Patient Self Return Phone Number [phone] (Primary) Chief Complaint BREATHING - shortness of breath or sounds breathless Reason for Call Symptomatic / Request for Health Information Initial Comment Caller states she is calling from the office regarding a patient who is experiencing higher heart rate and shortness of breath whenever he exerts himself and when he rests it takes a few hours to get back down to normal. Translation No Nurse Assessment Nurse: [name], RN, Melanie Date/Time (Eastern Time): 02/19/2021 4:18:03 PM Confirm and document reason for call. If symptomatic, describe symptoms. ---Caller states he was bending over and picking up branches and he also folded a tarp several times and his heart rate went up to 120 and was SOB. After several hours his heart rate went back down to 74 and his SOB improved. Does the patient have any new or worsening symptoms? ---Yes Will a triage be completed? ---Yes Related visit to physician within the last 2 weeks? ---No Does the PT have any chronic conditions? (i.e. diabetes, asthma, this includes High risk factors for pregnancy, etc.) ---Yes List chronic conditions. ---asthma Is this a behavioral health or substance abuse call? ---No Guidelines Guideline Title Affirmed Question Affirmed Notes Nurse Date/Time [name] Time) Breathing Difficulty Extra heart beats OR irregular heart beating  (i.e., "palpitations") [name] 02/19/2021 4:20:35 PM PLEASE NOTE: All timestamps contained within this report are represented as [place] Standard Time. CONFIDENTIALTY NOTICE: This fax transmission is intended only for the addressee. It contains information that is legally privileged, confidential or otherwise protected from use or disclosure. If you are not the intended recipient, you are strictly prohibited from reviewing, disclosing, copying using or disseminating any of this information or taking any action in reliance on or regarding this information. If you have received this fax in error, please notify us immediately by telephone so that we can arrange for its return to [place]. Phone: [phone], Toll-Free: [phone], Fax: [phone] Page: 2 of 2 Call Id: [id number] Disp. Time [name] Time) Disposition Final User 02/19/2021 4:14:58 PM Send to Urgent Queue [name] 02/19/2021 4:28:49 PM Attempt made - no message left [name] 02/19/2021 4:31:19 PM Attempt made - line busy [name], RN, [name] 02/19/2021 4:30:55 PM Go to ED Now Yes [name], RN, [name] Disagree/Comply Disagree Caller Understands Yes PreDisposition Call Doctor Care Advice Given Per Guideline GO TO ED NOW: * You need to be seen in the Emergency Department. * Go to the ED at ___________ Hospital. CARE ADVICE given per Breathing Difficulty (Adult) guideline. Comments User: [name], RN Date/Time (Eastern Time): 02/19/2021 4:30:32 PM Attempt to reach backline to report pt not going to Er for Eval. Pt states he made appt for next Tuesday and he will be out of town [address] Fri and Sat. User: [name], RN Date/Time [name] Time): 02/19/2021 4:47:26 PM Second attempt to reach backline and call was picked up but hung up quickly. Referrals REFERRED TO PCP OFFICE GO TO FACILITY REFUSED

## 2021-02-26 ENCOUNTER — Emergency Department (HOSPITAL_COMMUNITY)
Admission: EM | Admit: 2021-02-26 | Discharge: 2021-02-26 | Disposition: A | Discharge status: Home / Self Care | Payer: Medicare HMO | Attending: Emergency Medicine | Admitting: Emergency Medicine

## 2021-02-26 ENCOUNTER — Other Ambulatory Visit: Payer: Self-pay

## 2021-02-26 ENCOUNTER — Encounter (HOSPITAL_COMMUNITY): Payer: Self-pay | Admitting: Emergency Medicine

## 2021-02-26 ENCOUNTER — Emergency Department (HOSPITAL_COMMUNITY): Payer: Medicare HMO

## 2021-02-26 DIAGNOSIS — R0789 Other chest pain: Secondary | ICD-10-CM | POA: Diagnosis not present

## 2021-02-26 DIAGNOSIS — J45909 Unspecified asthma, uncomplicated: Secondary | ICD-10-CM | POA: Insufficient documentation

## 2021-02-26 DIAGNOSIS — K219 Gastro-esophageal reflux disease without esophagitis: Secondary | ICD-10-CM | POA: Diagnosis not present

## 2021-02-26 DIAGNOSIS — R0602 Shortness of breath: Secondary | ICD-10-CM | POA: Insufficient documentation

## 2021-02-26 DIAGNOSIS — Z79899 Other long term (current) drug therapy: Secondary | ICD-10-CM | POA: Diagnosis not present

## 2021-02-26 DIAGNOSIS — Z7984 Long term (current) use of oral hypoglycemic drugs: Secondary | ICD-10-CM | POA: Diagnosis not present

## 2021-02-26 DIAGNOSIS — E119 Type 2 diabetes mellitus without complications: Secondary | ICD-10-CM | POA: Diagnosis not present

## 2021-02-26 DIAGNOSIS — Z7982 Long term (current) use of aspirin: Secondary | ICD-10-CM | POA: Insufficient documentation

## 2021-02-26 DIAGNOSIS — R002 Palpitations: Secondary | ICD-10-CM | POA: Diagnosis not present

## 2021-02-26 DIAGNOSIS — R079 Chest pain, unspecified: Secondary | ICD-10-CM | POA: Diagnosis not present

## 2021-02-26 DIAGNOSIS — I493 Ventricular premature depolarization: Secondary | ICD-10-CM | POA: Diagnosis not present

## 2021-02-26 DIAGNOSIS — Z87891 Personal history of nicotine dependence: Secondary | ICD-10-CM | POA: Insufficient documentation

## 2021-02-26 DIAGNOSIS — I10 Essential (primary) hypertension: Secondary | ICD-10-CM | POA: Diagnosis not present

## 2021-02-26 LAB — BASIC METABOLIC PANEL
Anion gap: 10 (ref 5–15)
BUN: 10 mg/dL (ref 8–23)
CO2: 27 mmol/L (ref 22–32)
Calcium: 9 mg/dL (ref 8.9–10.3)
Chloride: 98 mmol/L (ref 98–111)
Creatinine, Ser: 0.89 mg/dL (ref 0.61–1.24)
GFR, Estimated: >60 mL/min (ref >60)
Glucose, Bld: 107 mg/dL — ABNORMAL HIGH (ref 70–99)
Potassium: 4.2 mmol/L (ref 3.5–5.1)
Sodium: 135 mmol/L (ref 135–145)

## 2021-02-26 LAB — CBC
HCT: 42.6 % (ref 39.0–52.0)
Hemoglobin: 13.7 g/dL (ref 13.0–17.0)
MCH: 29.8 pg (ref 26.0–34.0)
MCHC: 32.2 g/dL (ref 30.0–36.0)
MCV: 92.8 fL (ref 80.0–100.0)
Platelets: 240 10*3/uL (ref 150–400)
RBC: 4.59 MIL/uL (ref 4.22–5.81)
RDW: 13.1 % (ref 11.5–15.5)
WBC: 7.7 10*3/uL (ref 4.0–10.5)
nRBC: 0 % (ref 0.0–0.2)

## 2021-02-26 LAB — TROPONIN I (HIGH SENSITIVITY): Troponin I (High Sensitivity): 3 ng/L (ref <18)

## 2021-02-26 NOTE — Discharge Instructions (Addendum)
It was our pleasure to provide your ER care today - we hope that you feel better.  Follow up with your doctor/cardiologist in the next couple weeks.   Limit caffeine use. Eat heart healthy eating plan included getting adequate vegetables/fruits.   Return to ER if worse, new symptoms, persistent fast heart beat, fainting, chest pain, increased trouble breathing, or other concern.

## 2021-02-26 NOTE — ED Triage Notes (Signed)
Having irregular heartbeats that started last night, has hx of the same. Having tightness in his chest as well.

## 2021-02-26 NOTE — ED Provider Notes (Signed)
[facility] EMERGENCY DEPARTMENT Provider Note   CSN: [id number] Arrival date & time: 02/26/21  1105     History Chief Complaint  Patient presents with  . Palpitations    Brian Mullen is a 75 y.o. male.  Patient c/o sense palpitations last night, also noting some mild chest tightness/sob early this AM.   Symptoms acute onset, episodic, at rest, mild, recurrent. Episodes of palpitations lasts seconds per episode. No syncope. Hx pvc, denies other dysrhythmia. No hx cad. Pt denies any exertional cp or discomfort. No pleuritic pain. No leg pain or swelling. No orthopnea. Denies cough or uri symptoms. No fever or chills.  Notes mild caffeine use, indicating he has cut back from prior. Compliant w meds, denies recent changes.   The history is provided by the patient.  Palpitations Associated symptoms: no back pain, no cough and no vomiting        Past Medical History:  Diagnosis Date  . Allergy   . Asthma   . Diabetes mellitus without complication ([place])   . GERD (gastroesophageal reflux disease)   . Hypertension     Patient Active Problem List   Diagnosis Date Noted  . Chronic right-sided thoracic back pain 09/06/2020  . Leg cramping 03/19/2020  . Urinary frequency 03/19/2020  . Atrial tachycardia ([place]) 11/22/2019  . Dyspnea 09/13/2019  . History of tachycardia 09/13/2019  . PVC's (premature ventricular contractions) 09/13/2019  . Palpitations 09/08/2019  . Tinnitus of both ears 03/08/2019  . Testosterone deficiency 07/15/2018  . Syncope and collapse 02/11/2018  . Preventative health care 12/11/2016  . Left shoulder pain 01/21/2016  . Medicare annual wellness visit, subsequent 10/07/2015  . Insomnia 10/07/2015  . Asthma 03/14/2015  . Type 2 diabetes mellitus ([place]) 10/29/2014  . Pure hypercholesterolemia 10/29/2014  . Lumbar radiculopathy, chronic 09/22/2013  . Essential hypertension 08/18/2013    Past Surgical History:  Procedure Laterality Date  . ELBOW SURGERY     . KNEE SURGERY Right   . TONSILLECTOMY         Family History  Problem Relation Age of Onset  . Pulmonary embolism Father 35       Deceased  . Heart attack Mother 69       Deceased  . Diabetes Maternal Grandmother   . Heart attack Sister     Social History   Tobacco Use  . Smoking status: Former Research scientist (life sciences)  . Smokeless tobacco: Never Used  Vaping Use  . Vaping Use: Never used  Substance Use Topics  . Alcohol use: Yes    Alcohol/week: 1.0 - 2.0 standard drink    Types: 1 - 2 Standard drinks or equivalent per week    Comment: occ  . Drug use: No    Home Medications Prior to Admission medications   Medication Sig Start Date End Date Taking? Authorizing Provider  ACCU-CHEK AVIVA PLUS test strip USE AS INSTRUCTED TO TEST BLOOD SUGAR 2 TIMES DAILY 09/18/20   Pleas Koch, NP  albuterol (VENTOLIN HFA) 108 (90 Base) MCG/ACT inhaler Inhale 2 puffs into the lungs every 4 (four) hours as needed for wheezing or shortness of breath. 07/19/20   Pleas Koch, NP  aspirin 81 MG tablet Take 81 mg by mouth daily.    [provider]  Blood Glucose Monitoring Suppl (ONE TOUCH ULTRA 2) w/Device KIT Use as instructed to blood sugar 2 times daily. 04/20/17   Pleas Koch, NP  fexofenadine (ALLEGRA) 180 MG tablet Take 180 mg by mouth daily.  [provider]  Lancets [facility] ULTRASOFT) lancets Use as instructed to test blood sugar 2 times daily. 04/20/17   Pleas Koch, NP  losartan (COZAAR) 25 MG tablet TAKE 1 TABLET EVERY DAY 01/04/21   Pleas Koch, NP  metFORMIN (GLUCOPHAGE) 500 MG tablet TAKE 2 TABLETS (1,000 MG TOTAL) BY MOUTH 2 (TWO) TIMES DAILY WITH A MEAL. 04/16/20   Pleas Koch, NP  metoprolol succinate (TOPROL-XL) 25 MG 24 hr tablet TAKE 1 TABLET (25 MG TOTAL) BY MOUTH DAILY FOR HIGH BLOOD PRESSURE 11/12/20   Pleas Koch, NP  montelukast (SINGULAIR) 10 MG tablet TAKE 1 TABLET BY MOUTH EVERYDAY AT BEDTIME for allergies/asthma. 02/09/21    Pleas Koch, NP  rosuvastatin (CRESTOR) 5 MG tablet TAKE 1 TABLET EVERY EVENING FOR CHOLESTEROL 09/30/20   Pleas Koch, NP  tiotropium (SPIRIVA HANDIHALER) 18 MCG inhalation capsule Place 1 capsule (18 mcg total) into inhaler and inhale daily. 09/06/20   Pleas Koch, NP  tiZANidine (ZANAFLEX) 4 MG tablet Take 1-2 tablets (4-8 mg total) by mouth every 8 (eight) hours as needed for muscle spasms. 09/06/20   Pleas Koch, NP    Allergies    Coconut oil, Iodine, Shellfish allergy, and Shellfish-derived products  Review of Systems   Review of Systems  Constitutional: Negative for fever.  HENT: Negative for sore throat.   Eyes: Negative for redness.  Respiratory: Negative for cough.   Cardiovascular: Positive for palpitations. Negative for leg swelling.  Gastrointestinal: Negative for abdominal pain, diarrhea and vomiting.  Genitourinary: Negative for flank pain.  Musculoskeletal: Negative for back pain and neck pain.  Skin: Negative for rash.  Neurological: Negative for syncope and headaches.  Hematological: Does not bruise/bleed easily.  Psychiatric/Behavioral: Negative for confusion.    Physical Exam Updated Vital Signs BP 140/65 (BP Location: Right Arm)   Pulse 73   Temp 98.9 F (37.2 C) (Oral)   Resp 17   SpO2 99%   Physical Exam Vitals and nursing note reviewed.  Constitutional:      Appearance: Normal appearance. He is well-developed.  HENT:     Head: Atraumatic.     Nose: Nose normal.     Mouth/Throat:     Mouth: Mucous membranes are moist.     Pharynx: Oropharynx is clear.  Eyes:     General: No scleral icterus.    Conjunctiva/sclera: Conjunctivae normal.  Neck:     Trachea: No tracheal deviation.     Comments: Thyroid not grossly enlarged or tender.  Cardiovascular:     Rate and Rhythm: Normal rate and regular rhythm.     Pulses: Normal pulses.     Heart sounds: Normal heart sounds. No murmur heard. No friction rub. No gallop.    Pulmonary:     Effort: Pulmonary effort is normal. No accessory muscle usage or respiratory distress.     Breath sounds: Normal breath sounds.  Abdominal:     General: Bowel sounds are normal. There is no distension.     Palpations: Abdomen is soft.     Tenderness: There is no abdominal tenderness.  Genitourinary:    Comments: No cva tenderness. Musculoskeletal:        General: No swelling or tenderness.     Cervical back: Normal range of motion and neck supple. No rigidity.     Right lower leg: No edema.     Left lower leg: No edema.  Skin:    General: Skin is warm and dry.  Findings: No rash.  Neurological:     Mental Status: He is alert.     Comments: Alert, speech clear.   Psychiatric:        Mood and Affect: Mood normal.     ED Results / Procedures / Treatments   Labs (all labs ordered are listed, but only abnormal results are displayed) Results for orders placed or performed during the hospital encounter of 02/26/21  CBC  Result Value Ref Range   WBC 7.7 4.0 - 10.5 K/uL   RBC 4.59 4.22 - 5.81 MIL/uL   Hemoglobin 13.7 13.0 - 17.0 g/dL   HCT 42.6 39.0 - 52.0 %   MCV 92.8 80.0 - 100.0 fL   MCH 29.8 26.0 - 34.0 pg   MCHC 32.2 30.0 - 36.0 g/dL   RDW 13.1 11.5 - 15.5 %   Platelets 240 150 - 400 K/uL   nRBC 0.0 0.0 - 0.2 %  Basic metabolic panel  Result Value Ref Range   Sodium 135 135 - 145 mmol/L   Potassium 4.2 3.5 - 5.1 mmol/L   Chloride 98 98 - 111 mmol/L   CO2 27 22 - 32 mmol/L   Glucose, Bld 107 (H) 70 - 99 mg/dL   BUN 10 8 - 23 mg/dL   Creatinine, Ser 0.89 0.61 - 1.24 mg/dL   Calcium 9.0 8.9 - 10.3 mg/dL   GFR, Estimated >60 >60 mL/min   Anion gap 10 5 - 15  Troponin I (High Sensitivity)  Result Value Ref Range   Troponin I (High Sensitivity) 3 <18 ng/L    EKG EKG Interpretation  Date/Time:  Wednesday February 26 2021 11:22:36 EDT Ventricular Rate:  74 PR Interval:  132 QRS Duration: 94 QT Interval:  400 QTC Calculation: 444 R  Axis:   51 Text Interpretation: Normal sinus rhythm Normal ECG Confirmed by Lajean Saver [phone]) on 02/26/2021 11:31:55 AM   Radiology DG Chest Port 1 View  Result Date: 02/26/2021 CLINICAL DATA:  Short of breath with chest tightness this morning. EXAM: PORTABLE CHEST 1 VIEW COMPARISON:  08/18/2019 FINDINGS: Cardiac silhouette is normal in size. Normal mediastinal and hilar contours. Linear opacities are noted at the lung bases, greater on the left, consistent with atelectasis, new from the prior exam. Remainder of the lungs is clear. No pleural effusion or pneumothorax. Skeletal structures are grossly intact. IMPRESSION: 1. No acute cardiopulmonary disease. 2. Linear lung base opacities, greater on the left, consistent with atelectasis. Electronically Signed   By: Lajean Manes M.D.   On: 02/26/2021 12:15    Procedures Procedures   Medications Ordered in ED Medications - No data to display  ED Course  I have reviewed the triage vital signs and the nursing notes.  Pertinent labs & imaging results that were available during my care of the patient were reviewed by me and considered in my medical decision making (see chart for details).    MDM Rules/Calculators/A&P                         Iv ns. Continuous pulse ox and cardiac monitoring. Stat labs. Ecg.   Reviewed nursing notes and prior charts for additional history. Hx pvcs noted.   Labs reviewed/interpreted by me - chem normal. Trop normal.  CXR reviewed/interpreted by me - no pna.   Recheck pt, no chest pain or sob. Rare pvc on monitor, hx same.   Pt currently asymptomatic and appears stable for d/c.  rec cardiology f/u.  Return precautions provided.      Final Clinical Impression(s) / ED Diagnoses Final diagnoses:  None    Rx / DC Orders ED Discharge Orders    None       Lajean Saver, MD 02/26/21 1244

## 2021-02-27 NOTE — Progress Notes (Signed)
Cardiology Office Note    Date:  03/01/2021   ID:  Brian Mullen, DOB 1946-03-16, MRN [id number]  PCP:  Pleas Koch, NP  Cardiologist: Kate Sable, MD (Inactive)  --> Needs to switch to new MD  Chief Complaint  Patient presents with  . Follow-up    Recent Emergency Dept visit    History of Present Illness:    Brian Mullen is a 75 y.o. male with past medical history of asthma, HTN, HLD, Type 2 DM and palpitations who presents to the office today for follow-up from a recent Emergency Department visit.   He was examined by Ermalinda Barrios, PA-C in 11/2019 and reported having occasional palpitations but symptoms had improved with reduction in caffeine intake. He was continued on his current medications at that time.   In the interim, he presented to [facility] ED on 02/26/2021 for evaluation of palpitations and associated chest discomfort. Labs showed stable Hgb, platelets and electrolytes. Hs Troponin was negative and his EKG showed no acute ST changes. He did have occasional PVC's on telemetry but no significant arrhythmias. Was informed to follow-up with Cardiology as an outpatient.   In talking with the patient today, he reports a history of occasional palpitations but symptoms were more frequent the night before ED evaluation. Also reported intermittent dizziness and chest discomfort when the palpitations would occur. He reports symptoms have since improved. Unaware of any specific triggers. While he has been on Losartan for many years, he is concerned this is the culprit as he experienced palpitations with it in the past and symptoms resolved with being transitioned to Lisinopril.   No recent orthopnea, PND or pitting edema. Not very active at baseline secondary to chronic back pain. Uses a cane.     Past Medical History:  Diagnosis Date  . Allergy   . Asthma   . Diabetes mellitus without complication ([place])   . GERD (gastroesophageal reflux disease)   . Hypertension    . Palpitations    a. 10/2019: Monitor showing rare PVC's and no significant arrhythmias.     Past Surgical History:  Procedure Laterality Date  . ELBOW SURGERY    . KNEE SURGERY Right   . TONSILLECTOMY      Current Medications: Outpatient Medications Prior to Visit  Medication Sig Dispense Refill  . ACCU-CHEK AVIVA PLUS test strip USE AS INSTRUCTED TO TEST BLOOD SUGAR 2 TIMES DAILY 200 strip 0  . albuterol (VENTOLIN HFA) 108 (90 Base) MCG/ACT inhaler Inhale 2 puffs into the lungs every 4 (four) hours as needed for wheezing or shortness of breath. 18 g 0  . aspirin 81 MG tablet Take 81 mg by mouth daily.    . Blood Glucose Monitoring Suppl (ONE TOUCH ULTRA 2) w/Device KIT Use as instructed to blood sugar 2 times daily. 1 each 0  . fexofenadine (ALLEGRA) 180 MG tablet Take 180 mg by mouth daily.    . Lancets (ONETOUCH ULTRASOFT) lancets Use as instructed to test blood sugar 2 times daily. 100 each 5  . metFORMIN (GLUCOPHAGE) 500 MG tablet TAKE 2 TABLETS (1,000 MG TOTAL) BY MOUTH 2 (TWO) TIMES DAILY WITH A MEAL. 360 tablet 1  . metoprolol succinate (TOPROL-XL) 25 MG 24 hr tablet TAKE 1 TABLET (25 MG TOTAL) BY MOUTH DAILY FOR HIGH BLOOD PRESSURE 90 tablet 2  . montelukast (SINGULAIR) 10 MG tablet TAKE 1 TABLET BY MOUTH EVERYDAY AT BEDTIME for allergies/asthma. 90 tablet 0  . rosuvastatin (CRESTOR) 5 MG tablet TAKE 1  TABLET EVERY EVENING FOR CHOLESTEROL 90 tablet 1  . tiotropium (SPIRIVA HANDIHALER) 18 MCG inhalation capsule Place 1 capsule (18 mcg total) into inhaler and inhale daily. 30 capsule 2  . tiZANidine (ZANAFLEX) 4 MG tablet Take 1-2 tablets (4-8 mg total) by mouth every 8 (eight) hours as needed for muscle spasms. 60 tablet 0  . losartan (COZAAR) 25 MG tablet TAKE 1 TABLET EVERY DAY 90 tablet 1   No facility-administered medications prior to visit.     Allergies:   Coconut oil, Iodine, Shellfish allergy, and Shellfish-derived products   Social History   Socioeconomic  History  . Marital status: Married    Spouse name: Not on file  . Number of children: Not on file  . Years of education: Not on file  . Highest education level: Not on file  Occupational History  . Not on file  Tobacco Use  . Smoking status: Former Research scientist (life sciences)  . Smokeless tobacco: Never Used  Vaping Use  . Vaping Use: Never used  Substance and Sexual Activity  . Alcohol use: Yes    Alcohol/week: 1.0 - 2.0 standard drink    Types: 1 - 2 Standard drinks or equivalent per week    Comment: occ  . Drug use: No  . Sexual activity: Not on file  Other Topics Concern  . Not on file  Social History Narrative   Married.   No children   Retried. Worked as a Furniture conservator/restorer.   Enjoys working on [employer], yard work, gardening.   Social Determinants of Health   Financial Resource Strain: Not on file  Food Insecurity: Not on file  Transportation Needs: Not on file  Physical Activity: Not on file  Stress: Not on file  Social Connections: Not on file     Family History:  The patient's family history includes Diabetes in his maternal grandmother; Heart attack in his sister; Heart attack (age of onset: 66) in his mother; Pulmonary embolism (age of onset: 2) in his father.   Review of Systems:   Please see the history of present illness.     General:  No chills, fever, night sweats or weight changes.  Cardiovascular:  No exertional chest pain, dyspnea on exertion, edema, orthopnea, paroxysmal nocturnal dyspnea. Positive for palpitations.  Dermatological: No rash, lesions/masses Respiratory: No cough, dyspnea Urologic: No hematuria, dysuria Abdominal:   No nausea, vomiting, diarrhea, bright red blood per rectum, melena, or hematemesis Neurologic:  No visual changes, wkns, changes in mental status. All other systems reviewed and are otherwise negative except as noted above.   Physical Exam:    VS:  BP 132/60   Pulse 76   Ht _0  (1.753 m)   Wt 200 lb (90.7 kg)   SpO2 96%    BMI 29.53 kg/m    General: Well developed, well nourished,male appearing in no acute distress. Head: Normocephalic, atraumatic. Neck: No carotid bruits. JVD not elevated.  Lungs: Respirations regular and unlabored, without wheezes or rales.  Heart: Regular rate and rhythm. No S3 or S4.  No murmur, no rubs, or gallops appreciated. Abdomen: Appears non-distended. No obvious abdominal masses. Msk:  Strength and tone appear normal for age. No obvious joint deformities or effusions. Extremities: No clubbing or cyanosis. No pitting edema.  Distal pedal pulses are 2+ bilaterally. Neuro: Alert and oriented X 3. Moves all extremities spontaneously. No focal deficits noted. Psych:  Responds to questions appropriately with a normal affect. Skin: No rashes or lesions noted  Wt Readings from Last 3 Encounters:  02/28/21 200 lb (90.7 kg)  09/06/20 194 lb (88 kg)  03/19/20 179 lb 8 oz (81.4 kg)     Studies/Labs Reviewed:   EKG:  EKG is not ordered today. EKG from 02/26/2021 is reviewed and shows NSR, HR 74 with no acute ST changes.    Recent Labs: 03/06/2020: ALT 12 02/26/2021: BUN 10; Creatinine, Ser 0.89; Hemoglobin 13.7; Platelets 240; Potassium 4.2; Sodium 135   Lipid Panel    Component Value Date/Time   CHOL 134 03/06/2020 1000   TRIG 132.0 03/06/2020 1000   HDL 42.00 03/06/2020 1000   CHOLHDL 3 03/06/2020 1000   VLDL 26.4 03/06/2020 1000   LDLCALC 66 03/06/2020 1000   LDLCALC 51 02/11/2018 1532   LDLDIRECT 151.6 09/22/2013 0915    Additional studies/ records that were reviewed today include:   Echocardiogram: 10/2019 IMPRESSIONS    1. Left ventricular ejection fraction, by visual estimation, is 55 to  60%. The left ventricle has normal function. There is no left ventricular  hypertrophy.  2. The left ventricle has no regional wall motion abnormalities.  3. Global right ventricle has normal systolic function.The right  ventricular size is normal. No increase in right  ventricular wall  thickness.  4. Left atrial size was normal.  5. Right atrial size was normal.  6. Presence of pericardial fat pad.  7. The mitral valve is grossly normal. No evidence of mitral valve  regurgitation.  8. The tricuspid valve is grossly normal. Tricuspid valve regurgitation  is trivial.  9. The aortic valve is tricuspid. Aortic valve regurgitation is not  visualized.  10. The pulmonic valve was not well visualized. Pulmonic valve  regurgitation is not visualized.  11. TR signal is inadequate for assessing pulmonary artery systolic  pressure.  12. The inferior vena cava is normal in size with <50% respiratory  variability, suggesting right atrial pressure of 8 mmHg.   Event Monitor: 10/2019  Predominantly sinus rhythm, average heart rate 66 bpm. Isolated PVC. No significant arrhythmias.    Assessment:    1. Palpitations   2. PVC's (premature ventricular contractions)   3. Essential hypertension   4. Hyperlipidemia LDL goal <70      Plan:   In order of problems listed above:  1. Palpitations - He has a history of PVC's by prior monitor and recently experienced worsening symptoms which prompted him to go to the ED and evaluation at that time was reassuring.  - He thinks Losartan could be the culprit as outlined above and I reviewed with him this would be unlikely given the mechanism of action for the medication. Reports he did feel better on Lisinopril in the past, therefore will stop Losartan and switch to Lisinopril 77m daily. I encouraged him to reach out if symptoms do not improve as I would recommend to titrate Toprol-XL. Will continue current dosing for now of 283mdaily. We also reviewed the possibility of stress testing if recurrent chest pain but he declined this in the past by review of notes.   2. HTN -BP is well-controlled at 132/60 during today's visit. Continue Toprol-XL and will transition Losartan to Lisinopril as outlined above. I did  encourage him to follow his BP readings at home given the medication adjustment.   3. HLD - FLP in 02/2020 showed total cholesterol of 134, Triglycerides 132, HDL 42 and LDL 66. He remains on Crestor 33m433maily .     Medication Adjustments/Labs and Tests Ordered: Current  medicines are reviewed at length with the patient today.  Concerns regarding medicines are outlined above.  Medication changes, Labs and Tests ordered today are listed in the Patient Instructions below. Patient Instructions  Medication Instructions:  STOP Losartan   START Lisinopril 20 mg daily   *If you need a refill on your cardiac medications before your next appointment, please call your pharmacy*   Lab Work: None today If you have labs (blood work) drawn today and your tests are completely normal, you will receive your results only by: Marland Kitchen MyChart Message (if you have MyChart) OR . A paper copy in the mail If you have any lab test that is abnormal or we need to change your treatment, we will call you to review the results.   Testing/Procedures: None today   Follow-Up: At [facility], you and your health needs are our priority.  As part of our continuing mission to provide you with exceptional heart care, we have created designated Provider Care Teams.  These Care Teams include your primary Cardiologist (physician) and Advanced Practice Providers (APPs -  Physician Assistants and Nurse Practitioners) who all work together to provide you with the care you need, when you need it.  We recommend signing up for the patient portal called "MyChart".  Sign up information is provided on this After Visit Summary.  MyChart is used to connect with patients for Virtual Visits (Telemedicine).  Patients are able to view lab/test results, encounter notes, upcoming appointments, etc.  Non-urgent messages can be sent to your provider as well.   To learn more about what you can do with MyChart, go to [email or web address].     Your next appointment:   6 months  The format for your next appointment:   In Person  Provider:   You may see a cardiologist  or one of the following Advanced Practice Providers on your designated Care Team:    Bernerd Pho, PA-C       Other Instructions None         Signed, Erma Heritage, PA-C  03/01/2021 10:03 AM    [place]. [address] [place], [place] [address] Phone: [phone] Fax: [phone]

## 2021-02-28 ENCOUNTER — Encounter: Payer: Self-pay | Admitting: Student

## 2021-02-28 ENCOUNTER — Other Ambulatory Visit: Payer: Self-pay

## 2021-02-28 ENCOUNTER — Ambulatory Visit: Payer: Medicare HMO | Admitting: Student

## 2021-02-28 VITALS — BP 132/60 | HR 76 | Ht 69.0 in | Wt 200.0 lb

## 2021-02-28 DIAGNOSIS — I493 Ventricular premature depolarization: Secondary | ICD-10-CM

## 2021-02-28 DIAGNOSIS — E785 Hyperlipidemia, unspecified: Secondary | ICD-10-CM

## 2021-02-28 DIAGNOSIS — R002 Palpitations: Secondary | ICD-10-CM | POA: Diagnosis not present

## 2021-02-28 DIAGNOSIS — I10 Essential (primary) hypertension: Secondary | ICD-10-CM

## 2021-02-28 MED ORDER — LISINOPRIL 20 MG PO TABS: 20.0000 mg | ORAL_TABLET | Freq: Every day | ORAL | 3 refills | Status: DC

## 2021-02-28 NOTE — Patient Instructions (Signed)
Medication Instructions:  STOP Losartan   START Lisinopril 20 mg daily   *If you need a refill on your cardiac medications before your next appointment, please call your pharmacy*   Lab Work: None today If you have labs (blood work) drawn today and your tests are completely normal, you will receive your results only by: Marland Kitchen MyChart Message (if you have MyChart) OR . A paper copy in the mail If you have any lab test that is abnormal or we need to change your treatment, we will call you to review the results.   Testing/Procedures: None today   Follow-Up: At [facility], you and your health needs are our priority.  As part of our continuing mission to provide you with exceptional heart care, we have created designated Provider Care Teams.  These Care Teams include your primary Cardiologist (physician) and Advanced Practice Providers (APPs -  Physician Assistants and Nurse Practitioners) who all work together to provide you with the care you need, when you need it.  We recommend signing up for the patient portal called "MyChart".  Sign up information is provided on this After Visit Summary.  MyChart is used to connect with patients for Virtual Visits (Telemedicine).  Patients are able to view lab/test results, encounter notes, upcoming appointments, etc.  Non-urgent messages can be sent to your provider as well.   To learn more about what you can do with MyChart, go to [email or web address].    Your next appointment:   6 months  The format for your next appointment:   In Person  Provider:   You may see a cardiologist  or one of the following Advanced Practice Providers on your designated Care Team:    Randall An, [place]       Other Instructions None

## 2021-03-01 ENCOUNTER — Encounter: Payer: Self-pay | Admitting: Student

## 2021-03-04 ENCOUNTER — Encounter: Payer: Self-pay | Admitting: Primary Care

## 2021-03-04 ENCOUNTER — Ambulatory Visit (INDEPENDENT_AMBULATORY_CARE_PROVIDER_SITE_OTHER): Payer: Medicare HMO | Admitting: Primary Care

## 2021-03-04 ENCOUNTER — Other Ambulatory Visit: Payer: Self-pay

## 2021-03-04 VITALS — BP 116/52 | HR 71 | Temp 98.0°F | Ht 69.0 in | Wt 201.2 lb

## 2021-03-04 DIAGNOSIS — R002 Palpitations: Secondary | ICD-10-CM | POA: Diagnosis not present

## 2021-03-04 DIAGNOSIS — R0683 Snoring: Secondary | ICD-10-CM | POA: Diagnosis not present

## 2021-03-04 DIAGNOSIS — G479 Sleep disorder, unspecified: Secondary | ICD-10-CM

## 2021-03-04 NOTE — Patient Instructions (Signed)
Avoid caffeine after 12 pm. Avoid watching TV in bed. Get up and read a book if you cannot fall back asleep.  You will be contacted regarding your referral to pulmonology.  Please let us know if you have not been contacted within two weeks.   Continue the Toprol XL as prescribed.  It was a pleasure to see you today!

## 2021-03-04 NOTE — Progress Notes (Signed)
Subjective:    Patient ID: Brian Mullen, male    DOB: Jun 03, 1946, 75 y.o.   MRN: [id number]  HPI  Brian Mullen is a very pleasant 75 y.o. male with a history of atrial tachycardia, PVC's, hypertension, type 2 diabetes, tobacco abuse, COPD who presents today to discuss irregular heart beat and insomnia.   Evaluated at [facility] on 02/26/21 for symptoms of palpitations, chest tightness, shortness of breath that began earlier that day. Work up in the ED was without ACS or arhythmia, he did have occasional PVC, normal labs including troponin, chest xray without pneumonia.   Evaluated by cardiology last week for ED follow up. Losartan was changed to lisinopril 20 mg as he suspected losartan to be causing symptoms. Toprol XL was continued at 25 mg for now, but to increase to 50 mg if symptoms persisted. It was also recommended that he complete stress testing if symptoms persisted.  Today he endorses continued symptoms up until today, he added an extra half tablet of his Toprol XL to make 37.5 mg today and he's had no symptoms since. He continues to notice some shortness of breath, is not using his Spiriva or albuterol.   Chronic insomnia, is now sleeping about one-two hours per night which began about three days ago. He typically goes to bed around 8:30 pm, wakes around 10 pm-12 am, sometimes able to fall back asleep, sometimes will stay up for 2-3 hours. He will typically lay in bed when he cannot sleep. He does not typically nap during the days.   He mostly drinks decaff coffee and tea, does drink soda several days weekly during dinner. He watches TV in bed before going to sleep. His partner is here today who endorses that he snores lightly during the night, doesn't recall periods of apnea. He endorses daytime drowsiness, could nod off at anytime when resting (restaruants, riding in the car). Has never undergone a sleep study.   Review of Systems  Respiratory: Positive for shortness of breath.         Snoring   Cardiovascular: Positive for palpitations.       See HPI  Psychiatric/Behavioral: Positive for sleep disturbance.         Past Medical History:  Diagnosis Date  . Allergy   . Asthma   . Diabetes mellitus without complication ([facility])   . GERD (gastroesophageal reflux disease)   . Hypertension   . Palpitations    a. 10/2019: Monitor showing rare PVC's and no significant arrhythmias.     Social History   Socioeconomic History  . Marital status: Married    Spouse name: Not on file  . Number of children: Not on file  . Years of education: Not on file  . Highest education level: Not on file  Occupational History  . Not on file  Tobacco Use  . Smoking status: Former Games developer  . Smokeless tobacco: Never Used  Vaping Use  . Vaping Use: Never used  Substance and Sexual Activity  . Alcohol use: Yes    Alcohol/week: 1.0 - 2.0 standard drink    Types: 1 - 2 Standard drinks or equivalent per week    Comment: occ  . Drug use: No  . Sexual activity: Not on file  Other Topics Concern  . Not on file  Social History Narrative   Married.   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   Enjoys working on [employer], yard work, gardening.   Social  Determinants of Health   Financial Resource Strain: Not on file  Food Insecurity: Not on file  Transportation Needs: Not on file  Physical Activity: Not on file  Stress: Not on file  Social Connections: Not on file  Intimate Partner Violence: Not on file    Past Surgical History:  Procedure Laterality Date  . ELBOW SURGERY    . KNEE SURGERY Right   . TONSILLECTOMY      Family History  Problem Relation Age of Onset  . Pulmonary embolism Father 69       Deceased  . Heart attack Mother 78       Deceased  . Diabetes Maternal Grandmother   . Heart attack Sister     Allergies  Allergen Reactions  . Coconut Oil Anaphylaxis  . Iodine Anaphylaxis  . Shellfish Allergy Anaphylaxis  . Shellfish-Derived  Products Anaphylaxis    Current Outpatient Medications on File Prior to Visit  Medication Sig Dispense Refill  . ACCU-CHEK AVIVA PLUS test strip USE AS INSTRUCTED TO TEST BLOOD SUGAR 2 TIMES DAILY 200 strip 0  . albuterol (VENTOLIN HFA) 108 (90 Base) MCG/ACT inhaler Inhale 2 puffs into the lungs every 4 (four) hours as needed for wheezing or shortness of breath. 18 g 0  . aspirin 81 MG tablet Take 81 mg by mouth daily.    . Blood Glucose Monitoring Suppl (ONE TOUCH ULTRA 2) w/Device KIT Use as instructed to blood sugar 2 times daily. 1 each 0  . fexofenadine (ALLEGRA) 180 MG tablet Take 180 mg by mouth daily.    . Lancets (ONETOUCH ULTRASOFT) lancets Use as instructed to test blood sugar 2 times daily. 100 each 5  . lisinopril (ZESTRIL) 20 MG tablet Take 1 tablet (20 mg total) by mouth daily. 90 tablet 3  . metFORMIN (GLUCOPHAGE) 500 MG tablet TAKE 2 TABLETS (1,000 MG TOTAL) BY MOUTH 2 (TWO) TIMES DAILY WITH A MEAL. 360 tablet 1  . metoprolol succinate (TOPROL-XL) 25 MG 24 hr tablet TAKE 1 TABLET (25 MG TOTAL) BY MOUTH DAILY FOR HIGH BLOOD PRESSURE 90 tablet 2  . montelukast (SINGULAIR) 10 MG tablet TAKE 1 TABLET BY MOUTH EVERYDAY AT BEDTIME for allergies/asthma. 90 tablet 0  . rosuvastatin (CRESTOR) 5 MG tablet TAKE 1 TABLET EVERY EVENING FOR CHOLESTEROL 90 tablet 1  . tiotropium (SPIRIVA HANDIHALER) 18 MCG inhalation capsule Place 1 capsule (18 mcg total) into inhaler and inhale daily. 30 capsule 2  . tiZANidine (ZANAFLEX) 4 MG tablet Take 1-2 tablets (4-8 mg total) by mouth every 8 (eight) hours as needed for muscle spasms. 60 tablet 0   No current facility-administered medications on file prior to visit.    BP (!) 116/52   Pulse 71   Temp 98 F (36.7 C) (Temporal)   Ht $R'5\' 9"'Gr$  (1.753 m)   Wt 201 lb 4 oz (91.3 kg)   SpO2 97%   BMI 29.72 kg/m  Objective:   Physical Exam Cardiovascular:     Rate and Rhythm: Normal rate and regular rhythm.  Pulmonary:     Effort: Pulmonary effort is  normal.     Breath sounds: Normal breath sounds. No wheezing or rales.  Musculoskeletal:     Cervical back: Neck supple.  Skin:    General: Skin is warm and dry.  Neurological:     Mental Status: He is alert and oriented to person, place, and time.           Assessment & Plan:  This visit occurred during the SARS-CoV-2 public health emergency.  Safety protocols were in place, including screening questions prior to the visit, additional usage of staff PPE, and extensive cleaning of exam room while observing appropriate contact time as indicated for disinfecting solutions.

## 2021-03-04 NOTE — Assessment & Plan Note (Signed)
ED work up negative. Following with cardiology.  Continue Toprol XL 37.5 mg as instructed.

## 2021-03-04 NOTE — Assessment & Plan Note (Addendum)
Chronic, worse. Also with symptoms suspicious for sleep apnea.   Epworth sleepiness scale score of 4 today. Given symptoms, coupled with medical history, will refer to pulmonology for sleep study evaluation.   Discussed to stop caffeine after 12 pm, avoid watching TV prior to bed, read a book if unable to fall back asleep

## 2021-03-05 DIAGNOSIS — M9901 Segmental and somatic dysfunction of cervical region: Secondary | ICD-10-CM | POA: Diagnosis not present

## 2021-03-05 DIAGNOSIS — M9902 Segmental and somatic dysfunction of thoracic region: Secondary | ICD-10-CM | POA: Diagnosis not present

## 2021-03-05 DIAGNOSIS — M542 Cervicalgia: Secondary | ICD-10-CM | POA: Diagnosis not present

## 2021-03-05 DIAGNOSIS — M9903 Segmental and somatic dysfunction of lumbar region: Secondary | ICD-10-CM | POA: Diagnosis not present

## 2021-03-05 DIAGNOSIS — M546 Pain in thoracic spine: Secondary | ICD-10-CM | POA: Diagnosis not present

## 2021-03-05 DIAGNOSIS — M62830 Muscle spasm of back: Secondary | ICD-10-CM | POA: Diagnosis not present

## 2021-03-15 ENCOUNTER — Other Ambulatory Visit: Payer: Self-pay | Admitting: Primary Care

## 2021-03-15 DIAGNOSIS — R252 Cramp and spasm: Secondary | ICD-10-CM

## 2021-03-15 DIAGNOSIS — G8929 Other chronic pain: Secondary | ICD-10-CM

## 2021-03-28 ENCOUNTER — Ambulatory Visit (INDEPENDENT_AMBULATORY_CARE_PROVIDER_SITE_OTHER): Payer: Medicare HMO | Admitting: Primary Care

## 2021-03-28 ENCOUNTER — Other Ambulatory Visit: Payer: Self-pay

## 2021-03-28 ENCOUNTER — Encounter: Payer: Self-pay | Admitting: Primary Care

## 2021-03-28 VITALS — BP 130/78 | HR 97 | Temp 98.6°F | Ht 69.0 in | Wt 200.0 lb

## 2021-03-28 DIAGNOSIS — J4540 Moderate persistent asthma, uncomplicated: Secondary | ICD-10-CM | POA: Diagnosis not present

## 2021-03-28 DIAGNOSIS — Z125 Encounter for screening for malignant neoplasm of prostate: Secondary | ICD-10-CM | POA: Diagnosis not present

## 2021-03-28 DIAGNOSIS — Z0000 Encounter for general adult medical examination without abnormal findings: Secondary | ICD-10-CM | POA: Diagnosis not present

## 2021-03-28 DIAGNOSIS — R002 Palpitations: Secondary | ICD-10-CM | POA: Diagnosis not present

## 2021-03-28 DIAGNOSIS — Z87898 Personal history of other specified conditions: Secondary | ICD-10-CM | POA: Diagnosis not present

## 2021-03-28 DIAGNOSIS — I10 Essential (primary) hypertension: Secondary | ICD-10-CM | POA: Diagnosis not present

## 2021-03-28 DIAGNOSIS — E7800 Pure hypercholesterolemia, unspecified: Secondary | ICD-10-CM | POA: Diagnosis not present

## 2021-03-28 DIAGNOSIS — G479 Sleep disorder, unspecified: Secondary | ICD-10-CM | POA: Diagnosis not present

## 2021-03-28 DIAGNOSIS — G8929 Other chronic pain: Secondary | ICD-10-CM

## 2021-03-28 DIAGNOSIS — E119 Type 2 diabetes mellitus without complications: Secondary | ICD-10-CM

## 2021-03-28 DIAGNOSIS — M546 Pain in thoracic spine: Secondary | ICD-10-CM

## 2021-03-28 LAB — LIPID PANEL
Cholesterol: 124 mg/dL (ref 0–200)
HDL: 44.5 mg/dL (ref >39.00)
LDL Cholesterol: 59 mg/dL (ref 0–99)
NonHDL: 79.35
Total CHOL/HDL Ratio: 3
Triglycerides: 102 mg/dL (ref 0.0–149.0)
VLDL: 20.4 mg/dL (ref 0.0–40.0)

## 2021-03-28 LAB — HEPATIC FUNCTION PANEL
ALT: 11 U/L (ref 0–53)
AST: 13 U/L (ref 0–37)
Albumin: 4.3 g/dL (ref 3.5–5.2)
Alkaline Phosphatase: 47 U/L (ref 39–117)
Bilirubin, Direct: 0.1 mg/dL (ref 0.0–0.3)
Total Bilirubin: 0.5 mg/dL (ref 0.2–1.2)
Total Protein: 6.5 g/dL (ref 6.0–8.3)

## 2021-03-28 LAB — PSA, MEDICARE: PSA: 2.54 ng/ml (ref 0.10–4.00)

## 2021-03-28 LAB — HEMOGLOBIN A1C: Hgb A1c MFr Bld: 7.2 % — ABNORMAL HIGH (ref 4.6–6.5)

## 2021-03-28 NOTE — Assessment & Plan Note (Signed)
No recent use of Spiriva, uses albuterol seasonally during Spring and Fall. No wheezing on exam.  Continue PRN albuterol and Singulair 10 mg daily.

## 2021-03-28 NOTE — Assessment & Plan Note (Signed)
Improving with metoprolol succinate XL 25 mg 1 and 1/2 tablet daily.

## 2021-03-28 NOTE — Assessment & Plan Note (Signed)
Improved and doing well on PRN Tizanidine 4 mg. Continue same.  Encouraged regular activity and stretching.

## 2021-03-28 NOTE — Assessment & Plan Note (Signed)
Compliant to rosuvastatin 5 mg, continue same. Repeat lipid panel pending.  

## 2021-03-28 NOTE — Assessment & Plan Note (Signed)
Controlled and stable in the office today, compliant to metoprolol succinate XL 1 and 1/2 tablet daily, lisinopril 20 mg.   Suspect lisinopril could be contributing to a cough and tickle to the throat. Discussed to hold lisinopril for a week, monitor BP, and symptoms. He will update.   Continue metoprolol succinate XL 25 mg 1 and 1/2 tablet daily.

## 2021-03-28 NOTE — Patient Instructions (Addendum)
Hold your lisinopril for blood pressure for about one week.   Monitor your blood pressure, notify me if you see readings consistently at or above 140/90.  Continue metoprolol succinate 25 mg, 1 and 1/2 tablet daily.  You are due for Cologuard in November.  Your metformin could be contributing to the bowel urgency.  Please schedule a follow up appointment in 6 months for diabetes check.  It was a pleasure to see you today!   Preventive Care 75 Years and Older, Male Preventive care refers to lifestyle choices and visits with your health care provider that can promote health and wellness. This includes:  A yearly physical exam. This is also called an annual wellness visit.  Regular dental and eye exams.  Immunizations.  Screening for certain conditions.  Healthy lifestyle choices, such as: ? Eating a healthy diet. ? Getting regular exercise. ? Not using drugs or products that contain nicotine and tobacco. ? Limiting alcohol use. What can I expect for my preventive care visit? Physical exam Your health care provider will check your:  Height and weight. These may be used to calculate your BMI (body mass index). BMI is a measurement that tells if you are at a healthy weight.  Heart rate and blood pressure.  Body temperature.  Skin for abnormal spots. Counseling Your health care provider may ask you questions about your:  Past medical problems.  Family's medical history.  Alcohol, tobacco, and drug use.  Emotional well-being.  Home life and relationship well-being.  Sexual activity.  Diet, exercise, and sleep habits.  History of falls.  Memory and ability to understand (cognition).  Work and work Astronomer.  Access to firearms. What immunizations do I need? Vaccines are usually given at various ages, according to a schedule. Your health care provider will recommend vaccines for you based on your age, medical history, and lifestyle or other factors, such  as travel or where you work.   What tests do I need? Blood tests  Lipid and cholesterol levels. These may be checked every 5 years, or more often depending on your overall health.  Hepatitis C test.  Hepatitis B test. Screening  Lung cancer screening. You may have this screening every year starting at age 30 if you have a 30-pack-year history of smoking and currently smoke or have quit within the past 15 years.  Colorectal cancer screening. ? All adults should have this screening starting at age 46 and continuing until age 32. ? Your health care provider may recommend screening at age 67 if you are at increased risk. ? You will have tests every 1-10 years, depending on your results and the type of screening test.  Prostate cancer screening. Recommendations will vary depending on your family history and other risks.  Genital exam to check for testicular cancer or hernias.  Diabetes screening. ? This is done by checking your blood sugar (glucose) after you have not eaten for a while (fasting). ? You may have this done every 1-3 years.  Abdominal aortic aneurysm (AAA) screening. You may need this if you are a current or former smoker.  STD (sexually transmitted disease) testing, if you are at risk. Follow these instructions at home: Eating and drinking  Eat a diet that includes fresh fruits and vegetables, whole grains, lean protein, and low-fat dairy products. Limit your intake of foods with high amounts of sugar, saturated fats, and salt.  Take vitamin and mineral supplements as recommended by your health care provider.  Do not drink  alcohol if your health care provider tells you not to drink.  If you drink alcohol: ? Limit how much you have to 0-2 drinks a day. ? Be aware of how much alcohol is in your drink. In the [place] one drink equals one 12 oz bottle of beer (355 mL), one 5 oz glass of wine (148 mL), or one 1 oz glass of hard liquor (44 mL).   Lifestyle  Take daily  care of your teeth and gums. Brush your teeth every morning and night with fluoride toothpaste. Floss one time each day.  Stay active. Exercise for at least 30 minutes 5 or more days each week.  Do not use any products that contain nicotine or tobacco, such as cigarettes, e-cigarettes, and chewing tobacco. If you need help quitting, ask your health care provider.  Do not use drugs.  If you are sexually active, practice safe sex. Use a condom or other form of protection to prevent STIs (sexually transmitted infections).  Talk with your health care provider about taking a low-dose aspirin or statin.  Find healthy ways to cope with stress, such as: ? Meditation, yoga, or listening to music. ? Journaling. ? Talking to a trusted person. ? Spending time with friends and family. Safety  Always wear your seat belt while driving or riding in a vehicle.  Do not drive: ? If you have been drinking alcohol. Do not ride with someone who has been drinking. ? When you are tired or distracted. ? While texting.  Wear a helmet and other protective equipment during sports activities.  If you have firearms in your house, make sure you follow all gun safety procedures. What's next?  Visit your health care provider once a year for an annual wellness visit.  Ask your health care provider how often you should have your eyes and teeth checked.  Stay up to date on all vaccines. This information is not intended to replace advice given to you by your health care provider. Make sure you discuss any questions you have with your health care provider. Document Revised: 08/01/2019 Document Reviewed: 10/27/2018 Elsevier Patient Education  2021 [employer].

## 2021-03-28 NOTE — Assessment & Plan Note (Signed)
Sleep study pending 

## 2021-03-28 NOTE — Progress Notes (Signed)
Subjective:    Patient ID: Brian Mullen, male    DOB: 1946-10-29, 75 y.o.   MRN: [id number]  HPI  Brian Mullen is a very pleasant 75 y.o. male who presents today for complete physical.  He would like to note a dry, tickle to his throat with a cough. He was recently switched from losartan to lisinopril per cardiology for palpitations. The patient believed his palpitations to be secondary to losartan, had been on lisinopril in the past and did well. He's noted an improvement in palpitations since increasing his dose of metoprolol succinate 25 mg to 1 and 1/2 tablet daily.  Immunizations: -Tetanus: 2014 -Influenza: Did not complete last season -Covid-19: Has not completed -Shingles: Zostavax in 2018 -Pneumonia: Prevnar in 2014, pneumovax in 2020  Diet: He endorses a poor diet Exercise: No regular exercise   Eye exam: Completes annually  Dental exam: No recent exam  Colonoscopy: Cologuard in November 2019 PSA: Due   BP Readings from Last 3 Encounters:  03/28/21 130/78  03/04/21 (!) 116/52  02/28/21 132/60    Wt Readings from Last 3 Encounters:  03/28/21 200 lb (90.7 kg)  03/04/21 201 lb 4 oz (91.3 kg)  02/28/21 200 lb (90.7 kg)      Review of Systems  Constitutional: Negative for unexpected weight change.  HENT: Positive for postnasal drip. Negative for rhinorrhea.   Eyes: Negative for visual disturbance.  Respiratory: Positive for cough. Negative for shortness of breath.   Cardiovascular: Negative for chest pain.  Gastrointestinal: Negative for constipation and diarrhea.  Genitourinary: Negative for difficulty urinating.  Musculoskeletal: Positive for arthralgias and back pain.  Skin: Negative for rash.  Allergic/Immunologic: Positive for environmental allergies.  Neurological: Negative for dizziness, numbness and headaches.  Psychiatric/Behavioral: The patient is not nervous/anxious.          Past Medical History:  Diagnosis Date  . Allergy   . Asthma    . Diabetes mellitus without complication ([facility])   . GERD (gastroesophageal reflux disease)   . Hypertension   . Palpitations    a. 10/2019: Monitor showing rare PVC's and no significant arrhythmias.     Social History   Socioeconomic History  . Marital status: Married    Spouse name: Not on file  . Number of children: Not on file  . Years of education: Not on file  . Highest education level: Not on file  Occupational History  . Not on file  Tobacco Use  . Smoking status: Former Research scientist (life sciences)  . Smokeless tobacco: Never Used  Vaping Use  . Vaping Use: Never used  Substance and Sexual Activity  . Alcohol use: Yes    Alcohol/week: 1.0 - 2.0 standard drink    Types: 1 - 2 Standard drinks or equivalent per week    Comment: occ  . Drug use: No  . Sexual activity: Not on file  Other Topics Concern  . Not on file  Social History Narrative   Married.   No children   Retried. Worked as a Furniture conservator/restorer.   Enjoys working on [employer], yard work, gardening.   Social Determinants of Health   Financial Resource Strain: Not on file  Food Insecurity: Not on file  Transportation Needs: Not on file  Physical Activity: Not on file  Stress: Not on file  Social Connections: Not on file  Intimate Partner Violence: Not on file    Past Surgical History:  Procedure Laterality Date  . ELBOW SURGERY    .  KNEE SURGERY Right   . TONSILLECTOMY      Family History  Problem Relation Age of Onset  . Pulmonary embolism Father 19       Deceased  . Heart attack Mother 77       Deceased  . Diabetes Maternal Grandmother   . Heart attack Sister     Allergies  Allergen Reactions  . Coconut Oil Anaphylaxis  . Iodine Anaphylaxis  . Shellfish Allergy Anaphylaxis  . Shellfish-Derived Products Anaphylaxis    Current Outpatient Medications on File Prior to Visit  Medication Sig Dispense Refill  . ACCU-CHEK AVIVA PLUS test strip USE AS INSTRUCTED TO TEST BLOOD SUGAR 2 TIMES DAILY 200  strip 0  . albuterol (VENTOLIN HFA) 108 (90 Base) MCG/ACT inhaler Inhale 2 puffs into the lungs every 4 (four) hours as needed for wheezing or shortness of breath. 18 g 0  . aspirin 81 MG tablet Take 81 mg by mouth daily.    . Blood Glucose Monitoring Suppl (ONE TOUCH ULTRA 2) w/Device KIT Use as instructed to blood sugar 2 times daily. 1 each 0  . fexofenadine (ALLEGRA) 180 MG tablet Take 180 mg by mouth daily.    . Lancets (ONETOUCH ULTRASOFT) lancets Use as instructed to test blood sugar 2 times daily. 100 each 5  . lisinopril (ZESTRIL) 20 MG tablet Take 1 tablet (20 mg total) by mouth daily. 90 tablet 3  . metFORMIN (GLUCOPHAGE) 500 MG tablet TAKE 2 TABLETS (1,000 MG TOTAL) BY MOUTH 2 (TWO) TIMES DAILY WITH A MEAL. 360 tablet 1  . metoprolol succinate (TOPROL-XL) 25 MG 24 hr tablet TAKE 1 TABLET (25 MG TOTAL) BY MOUTH DAILY FOR HIGH BLOOD PRESSURE (Patient taking differently: Take 25 mg by mouth daily. for high blood pressure 1 1/2 tab daily) 90 tablet 2  . montelukast (SINGULAIR) 10 MG tablet TAKE 1 TABLET BY MOUTH EVERYDAY AT BEDTIME for allergies/asthma. 90 tablet 0  . rosuvastatin (CRESTOR) 5 MG tablet TAKE 1 TABLET EVERY EVENING FOR CHOLESTEROL 90 tablet 1  . tiotropium (SPIRIVA HANDIHALER) 18 MCG inhalation capsule Place 1 capsule (18 mcg total) into inhaler and inhale daily. 30 capsule 2  . tiZANidine (ZANAFLEX) 4 MG tablet TAKE 1 TABLET (4 MG TOTAL) BY MOUTH AT BEDTIME AS NEEDED FOR MUSCLE SPASMS. 60 tablet 0   No current facility-administered medications on file prior to visit.    BP 130/78   Pulse 97   Temp 98.6 F (37 C) (Temporal)   Ht $R'5\' 9"'IV$  (1.753 m)   Wt 200 lb (90.7 kg)   SpO2 98%   BMI 29.53 kg/m  Objective:   Physical Exam HENT:     Right Ear: Tympanic membrane and ear canal normal.     Left Ear: Tympanic membrane and ear canal normal.     Nose: Nose normal.     Right Sinus: No maxillary sinus tenderness or frontal sinus tenderness.     Left Sinus: No maxillary  sinus tenderness or frontal sinus tenderness.  Eyes:     Conjunctiva/sclera: Conjunctivae normal.     Pupils: Pupils are equal, round, and reactive to light.  Neck:     Thyroid: No thyromegaly.     Vascular: No carotid bruit.  Cardiovascular:     Rate and Rhythm: Normal rate and regular rhythm.     Heart sounds: Normal heart sounds.  Pulmonary:     Effort: Pulmonary effort is normal.     Breath sounds: Normal breath sounds. No wheezing or rales.  Abdominal:     General: Bowel sounds are normal.     Palpations: Abdomen is soft.     Tenderness: There is no abdominal tenderness.  Musculoskeletal:        General: Normal range of motion.     Cervical back: Neck supple.  Skin:    General: Skin is warm and dry.  Neurological:     Mental Status: He is alert and oriented to person, place, and time.     Cranial Nerves: No cranial nerve deficit.     Deep Tendon Reflexes: Reflexes are normal and symmetric.  Psychiatric:        Mood and Affect: Mood normal.           Assessment & Plan:      This visit occurred during the SARS-CoV-2 public health emergency.  Safety protocols were in place, including screening questions prior to the visit, additional usage of staff PPE, and extensive cleaning of exam room while observing appropriate contact time as indicated for disinfecting solutions.

## 2021-03-28 NOTE — Assessment & Plan Note (Signed)
Repeat A1C pending.  Continue Metformin 1000 mg BID. He does express some fecal urgency, discussed that metformin could be contributing.  Eye exam UTD. Managed on ACE and statin.

## 2021-03-28 NOTE — Assessment & Plan Note (Signed)
Immunizations UTD. Cologuard UTD, due in November 2022, discussed with patient.  PSA due and pending.  Discussed the importance of a healthy diet and regular exercise in order for weight loss, and to reduce the risk of any potential medical problems.  Exam today stable. Labs pending

## 2021-03-28 NOTE — Assessment & Plan Note (Addendum)
Improving with metoprolol succinate XL 25 mg, 1 and 1/2 tablet daily. Continue same. Follows with cardiology.   Do not believe that lisinopril or losartan have much to do with his palpitations. Given what sounds to be an ACE-I induced cough we will hold his lisinopril and he will update.

## 2021-04-07 ENCOUNTER — Ambulatory Visit: Payer: Medicare HMO | Admitting: Pulmonary Disease

## 2021-04-07 ENCOUNTER — Encounter: Payer: Self-pay | Admitting: Pulmonary Disease

## 2021-04-07 ENCOUNTER — Other Ambulatory Visit: Payer: Self-pay

## 2021-04-07 DIAGNOSIS — F5104 Psychophysiologic insomnia: Secondary | ICD-10-CM

## 2021-04-07 NOTE — Patient Instructions (Signed)
Rules of sleep hygiene were discussed  - light exercise -avoid caffeinated beverages after noon - no more than 20 mins staying awake in bed, if not asleep, get out of bed & reading or light music - No TV or computer games at bedtime. - Try later bedtime of 10 pm -OK to trial melatonin 5mg  around 8 p

## 2021-04-07 NOTE — Assessment & Plan Note (Signed)
Brian Mullen's problem seems to be related to sleep fragmentation and frequent nocturnal awakenings and insomnia of sleep maintenance.  He has a long sleep history of waking up at 3 AM for work and thought this may be related to circadian disorder complicated by an elderly sleep pattern with frequent awakenings.  He does not seem to have restless legs and there are no red flags to indicate OSA as the cause of his awakenings.  He does have poor sleep hygiene and today we focused mostly on these issues.  I gave him some tricks for sleep consolidation and to try to avoid using medications that may lead to complications in the elderly  Rules of sleep hygiene were discussed  - light exercise -avoid caffeinated beverages after noon - no more than 20 mins staying awake in bed, if not asleep, get out of bed & reading or light music - No TV or computer games at bedtime. - Try later bedtime of 10 pm -OK to trial melatonin 5mg  around 8 p   I do not think that sleep study is necessary at this time.  Certainly willing to consider if he develops symptoms indicative of OSA

## 2021-04-07 NOTE — Progress Notes (Signed)
Subjective:    Patient ID: Brian Mullen, male    DOB: 1946-07-17, 75 y.o.   MRN: [id number]  HPI  Brian Mullen is a 75 year old transplant from [place], [place] who presents for evaluation of sleep disturbance in the form of frequent nocturnal awakenings. He worked until age 61 with an early wake up time of 3 AM.  His wife passed 4 years ago.  He denies sadness of mood.  His main complaint is that he rarely sleeps through the night.  He reports frequent nocturnal awakenings sometimes for 1 to 2 hours.  He lives with his partner Brian Mullen who is 20 years younger than him was noted soft snoring.  No bed partner history is available today. Nipple sleepiness score is 2.  Bedtime is between 830 and 9:30 PM, TV stays on through the night, sleep latency is variable from minutes to 1 hour.  He sleeps on his right side with 1 pillow, reports 2-4 nocturnal awakenings every couple of hours and sometimes he can have a long after awakenings.  Reports nocturia and on average he is up by 3 AM sometimes rarely he will sleep up to 7 AM. He is tired during the day. He has tried Benadryl and NyQuil in the past but this leaves him with a fog in his brain There is no history suggestive of cataplexy, sleep paralysis or parasomnias    Past Medical History:  Diagnosis Date  . Allergy   . Asthma   . Diabetes mellitus without complication ([facility])   . GERD (gastroesophageal reflux disease)   . Hypertension   . Palpitations    a. 10/2019: Monitor showing rare PVC's and no significant arrhythmias.     Past Surgical History:  Procedure Laterality Date  . ELBOW SURGERY    . KNEE SURGERY Right   . TONSILLECTOMY      Allergies  Allergen Reactions  . Coconut Oil Anaphylaxis  . Iodine Anaphylaxis  . Shellfish Allergy Anaphylaxis  . Shellfish-Derived Products Anaphylaxis   Social Connections: Not on file    Family History  Problem Relation Age of Onset  . Pulmonary embolism Father 110       Deceased  . Heart  attack Mother 72       Deceased  . Diabetes Maternal Grandmother   . Heart attack Sister     Review of Systems Constitutional: negative for anorexia, fevers and sweats  Eyes: negative for irritation, redness and visual disturbance  Ears, nose, mouth, throat, and face: negative for earaches, epistaxis, nasal congestion and sore throat  Respiratory: negative for cough, dyspnea on exertion, sputum and wheezing  Cardiovascular: negative for chest pain, dyspnea, lower extremity edema, orthopnea, palpitations and syncope  Gastrointestinal: negative for abdominal pain, constipation, diarrhea, melena, nausea and vomiting  Genitourinary:negative for dysuria, frequency and hematuria  Hematologic/lymphatic: negative for bleeding, easy bruising and lymphadenopathy  Musculoskeletal:negative for arthralgias, muscle weakness and stiff joints  Neurological: negative for coordination problems, gait problems, headaches and weakness  Endocrine: negative for diabetic symptoms including polydipsia, polyuria and weight loss     Objective:   Physical Exam  Gen. Pleasant, elderly,well-nourished, in no distress, normal affect ENT - no pallor,icterus, no post nasal drip Neck: No JVD, no thyromegaly, no carotid bruits Lungs: no use of accessory muscles, no dullness to percussion, clear without rales or rhonchi  Cardiovascular: Rhythm regular, heart sounds  normal, no murmurs or gallops, no peripheral edema Abdomen: soft and non-tender, no hepatosplenomegaly, BS normal. Musculoskeletal: No deformities, no cyanosis  or clubbing Neuro:  alert, non focal       Assessment & Plan:

## 2021-04-10 ENCOUNTER — Other Ambulatory Visit: Payer: Self-pay | Admitting: Primary Care

## 2021-04-10 DIAGNOSIS — E119 Type 2 diabetes mellitus without complications: Secondary | ICD-10-CM

## 2021-04-15 ENCOUNTER — Telehealth: Payer: Self-pay | Admitting: *Deleted

## 2021-04-15 NOTE — Telephone Encounter (Signed)
Patient called stating that he has had a dry cough for a while and was told to stop his Lisinopril because it can cause a cough. Patient stated that he continues to have the dry cough since stopping the Lisinopril and it has gotten worse. Patient stated that he seems to have a dry spot in his throat. Patient stated that nose spray and a cough drop seems to help the cough. Patient wanted to check back with Mayra Reel NP before starting back on the Lisinopril since he is still having the cough.

## 2021-04-15 NOTE — Telephone Encounter (Signed)
I still don't recommend he resume lisinopril, would he be willing to switch to losartan 50 mg? This one helps to protect the kidneys from diabetes and doesn't tend to cause a cough.  If so the okay to send in losartan 50 mg, take 1 tablet by mouth once daily for blood pressure.  #90, 3 refills  BP Readings from Last 3 Encounters:  04/07/21 (!) 142/72  03/28/21 130/78  03/04/21 (!) 116/52

## 2021-04-16 ENCOUNTER — Other Ambulatory Visit: Payer: Self-pay | Admitting: Primary Care

## 2021-04-16 ENCOUNTER — Other Ambulatory Visit: Payer: Self-pay

## 2021-04-16 DIAGNOSIS — J4540 Moderate persistent asthma, uncomplicated: Secondary | ICD-10-CM

## 2021-04-16 MED ORDER — LOSARTAN POTASSIUM 50 MG PO TABS: 50.0000 mg | ORAL_TABLET | Freq: Every day | ORAL | 0 refills | Status: DC

## 2021-04-16 MED ORDER — LOSARTAN POTASSIUM 50 MG PO TABS
50.0000 mg | ORAL_TABLET | Freq: Every day | ORAL | 3 refills | Status: DC
Start: 2021-04-16 — End: 2021-05-20

## 2021-04-16 NOTE — Telephone Encounter (Signed)
Left message to return call to our office.  

## 2021-04-16 NOTE — Telephone Encounter (Signed)
Patient called back would like to start meds have called in to mail order as instructed in message.

## 2021-04-17 ENCOUNTER — Telehealth: Payer: Self-pay | Admitting: Primary Care

## 2021-04-17 NOTE — Telephone Encounter (Signed)
[facility] called in wanted to know if we received a refill request of albuterol inhaler. They sent it in by fax  on 5/31 and e-scribed on 5/26.

## 2021-04-17 NOTE — Telephone Encounter (Signed)
Has been received and faxed back today

## 2021-05-01 NOTE — Addendum Note (Signed)
Addended by: Doreene Nest on: 05/01/2021 01:37 PM   Modules accepted: Orders

## 2021-05-01 NOTE — Telephone Encounter (Signed)
Patient states that he has been taking the Spiriva, before bed. He stared back on that about 3 weeks ago. He is taking Allegra daily.

## 2021-05-01 NOTE — Telephone Encounter (Signed)
Pt called back and left a message on triage line stating even after stopping the lisinopril, he is still having a dry, non-productive cough. Asking what he should do. Please advise at [phone].

## 2021-05-01 NOTE — Telephone Encounter (Signed)
If he's doing all of those things then I recommend we see him back for an acute visit of his ongoing symptoms.  We may need some additional imaging/treatment.

## 2021-05-01 NOTE — Telephone Encounter (Signed)
Other common causes of chronic cough include:  COPD from smoking, allergies, and heartburn.  If any SOB, coughing fits then recommend he resume Spiriva inhaler.  If he is waking during the night coughing OR has heartburn symptoms then recommend a trial of OTC Pepcid 20 mg.   If throat drainage with cough then recommend Singulair and Allegra, not sure if he's taking either of these right now.

## 2021-05-02 NOTE — Telephone Encounter (Signed)
Left message to return call to our office.  

## 2021-05-07 ENCOUNTER — Telehealth: Payer: Self-pay

## 2021-05-07 NOTE — Telephone Encounter (Signed)
Patient states he received a called from our office around 10:30 am today but I do not see anything in the chart about this. I asked patient to check his voicemail because we always leave a message unless it was an automated system calling in regards to something but I do not know. I advised patient I would send a message back to the CMA to see if they have any notes on the patient and maybe was not documented but I asked patient to call us back if he figures out who it was that called by the voicemail message.

## 2021-05-07 NOTE — Telephone Encounter (Signed)
Left message to return call to our office.  

## 2021-05-13 NOTE — Telephone Encounter (Signed)
Noted, I made my recommendations, the choice to come in for evaluation as his, we will see him when he is ready.

## 2021-05-13 NOTE — Telephone Encounter (Signed)
Brian Mullen see message and advise if anything further needs to be done.

## 2021-05-13 NOTE — Telephone Encounter (Signed)
Spoke to pt told him calling to follow up on symptoms, K ate said if symptoms are on going we may need to order additional imaging/treatment. Pt said he still has a cough but is productive now and expectorating clear sputum. Denies fever or chills. Pt also having leg cramps when on his feet a lot but said the Tizanidine is helping taking as needed. Told pt if symptoms worsen please call the office to schedule an appt. Pt verbalized understanding.

## 2021-05-15 ENCOUNTER — Telehealth: Payer: Self-pay

## 2021-05-15 NOTE — Telephone Encounter (Signed)
Called patient reviewed all information and repeated back to me. Will call if any questions.  ? ?

## 2021-05-15 NOTE — Telephone Encounter (Signed)
[facility] Day - Client TELEPHONE ADVICE RECORD [facility] Patient Name: Brian Mullen [place] Gender: Male DOB: June 24, 1946 Age: 75 Y 4 M 18 D Return Phone Number: [phone] (Primary), [phone] (Secondary) Address[place] [address] Client [facility] Day - Client Client Site [facility] - Day Physician [name] - NP Contact Type Call Who Is Calling Patient / Member / Family / Caregiver Call Type Triage / Clinical Relationship To Patient Self Return Phone Number [phone] (Primary) Chief Complaint Dizziness Reason for Call Symptomatic / Request for Health Information Initial Comment Office is transferring pt with dizziness, fatigue, wet/dry cough. Dizziness has worsened over a week. Has appt next TUE (soonest). GOTO Facility Not Listed UC nearby Translation No Nurse Assessment Nurse: Lily Kocher, RN, Adriana Date/Time (Eastern Time): 05/15/2021 11:39:11 AM Confirm and document reason for call. If symptomatic, describe symptoms. ---pt reports exhaustion, and dizziness. also cough for 4-6 weeks, was dry, then became productive. sounds like rattling. pt is dizzy now. sx onset 4-6 weeks, was intermittent. worse with movement. today has been steady all morning. Does the patient have any new or worsening symptoms? ---Yes Will a triage be completed? ---Yes Related visit to physician within the last 2 weeks? ---No Does the PT have any chronic conditions? (i.e. diabetes, asthma, this includes High risk factors for pregnancy, etc.) ---Yes List chronic conditions. ---asthma arrhythmia diabetes high cholesterol on daily allergy meds gerd Is this a behavioral health or substance abuse call? ---No Guidelines Guideline Title Affirmed Question Affirmed Notes Nurse Date/Time (Eastern Time) Dizziness - Lightheadedness SEVERE dizziness (e.g., unable to stand, requires support to  walk, feels like passing out now) Lily Kocher, Charity fundraiser, Ricki Rodriguez 05/15/2021 11:44:14 AM PLEASE NOTE: All timestamps contained within this report are represented as [place] Standard Time. CONFIDENTIALTY NOTICE: This fax transmission is intended only for the addressee. It contains information that is legally privileged, confidential or otherwise protected from use or disclosure. If you are not the intended recipient, you are strictly prohibited from reviewing, disclosing, copying using or disseminating any of this information or taking any action in reliance on or regarding this information. If you have received this fax in error, please notify us immediately by telephone so that we can arrange for its return to [place]. Phone: [phone], Toll-Free: [phone], Fax: [phone] Page: 2 of 2 Call Id: [id number] Disp. Time [name] Time) Disposition Final User 05/15/2021 11:49:59 AM Go to ED Now (or PCP triage) Yes Lily Kocher, RN, Adriana Caller Disagree/Comply Comply Caller Understands Yes PreDisposition Call Doctor Care Advice Given Per Guideline GO TO ED NOW (OR PCP TRIAGE): CARE ADVICE given per Dizziness (Adult) guideline. ANOTHER ADULT SHOULD DRIVE: * It is better and safer if another adult drives instead of you. Referrals GO TO FACILITY OTHER - SPECIFY

## 2021-05-15 NOTE — Telephone Encounter (Signed)
Noted, he was reporting this cough during his last visit a few months ago.  We will evaluate him as scheduled unless he is seen elsewhere sooner.  Will need x-ray or CT chest.

## 2021-05-15 NOTE — Telephone Encounter (Signed)
I spoke with pt; pt is not sure if he is going to go to UC or ED pt is not at home right now and cannot ck BP. Pt said if he gets worse he will go to [facility] or ED otherwise he will keep the appt he already has with Mayra Reel NP on 05/20/21.pt said he has had cough on and off for weeks. Pt has occasional SOB on and off but pt thinks that is due to time of year it is.  No other covid symptoms and no CP. UC & ED precautions given and pt voiced understanding. Sending note to Allayne Gitelman NP and [facility] CMA.

## 2021-05-20 ENCOUNTER — Encounter: Payer: Self-pay | Admitting: Primary Care

## 2021-05-20 ENCOUNTER — Ambulatory Visit (INDEPENDENT_AMBULATORY_CARE_PROVIDER_SITE_OTHER)
Admission: RE | Admit: 2021-05-20 | Discharge: 2021-05-20 | Disposition: A | Discharge status: Home / Self Care | Payer: Medicare HMO | Source: Ambulatory Visit | Attending: Primary Care | Admitting: Primary Care

## 2021-05-20 ENCOUNTER — Other Ambulatory Visit: Payer: Self-pay

## 2021-05-20 ENCOUNTER — Ambulatory Visit (INDEPENDENT_AMBULATORY_CARE_PROVIDER_SITE_OTHER): Payer: Medicare HMO | Admitting: Primary Care

## 2021-05-20 VITALS — BP 116/68 | HR 70 | Temp 98.7°F | Ht 68.0 in | Wt 199.4 lb

## 2021-05-20 DIAGNOSIS — R053 Chronic cough: Secondary | ICD-10-CM

## 2021-05-20 DIAGNOSIS — R42 Dizziness and giddiness: Secondary | ICD-10-CM | POA: Insufficient documentation

## 2021-05-20 DIAGNOSIS — H938X3 Other specified disorders of ear, bilateral: Secondary | ICD-10-CM | POA: Insufficient documentation

## 2021-05-20 DIAGNOSIS — I10 Essential (primary) hypertension: Secondary | ICD-10-CM | POA: Diagnosis not present

## 2021-05-20 DIAGNOSIS — R059 Cough, unspecified: Secondary | ICD-10-CM | POA: Diagnosis not present

## 2021-05-20 HISTORY — DX: Chronic cough: R05.3

## 2021-05-20 MED ORDER — AMLODIPINE BESYLATE 5 MG PO TABS: 5.0000 mg | ORAL_TABLET | Freq: Every day | ORAL | 0 refills | Status: DC

## 2021-05-20 MED ORDER — FLUTICASONE PROPIONATE 50 MCG/ACT NA SUSP: 1.0000 | Freq: Two times a day (BID) | NASAL | 0 refills | Status: DC

## 2021-05-20 NOTE — Assessment & Plan Note (Signed)
Could be contributing to dizziness. Trial of Flonase sent to pharmacy. He will update.  Exam today without cerumen impaction or infection.

## 2021-05-20 NOTE — Assessment & Plan Note (Signed)
Lungs clear, he doesn't appear acutely ill, doesn't appear to be asthma, could possibly be GERD but does take omeprazole 40 mg.  Stop losartan, start amlodipine. Continue omeprazole 40 mg. Checking chest xray today. Add Flonase daily.  Consider switching inhalers from Spiriva to Symbicort. He will update.

## 2021-05-20 NOTE — Progress Notes (Signed)
Subjective:    Patient ID: Brian Mullen, male    DOB: 1946-05-03, 75 y.o.   MRN: [id number]  HPI  Brian Mullen is a very pleasant 75 y.o. male with a history of asthma, hypertension, type 2 diabetes, tobacco abuse who presents today to discuss dizziness and cough.  Dizziness began a few weeks ago, occurring daily at the time, last week was worst. Occurs with positional changes, rising and turning after bending forward, rising slowly. Last week his dizziness occurred when driving, had to pull over on the side of the road. Was once evaluated by ENT for tinnitus, no recent visit. He has tried Dramamine with little improvement. He doesn't feel as though the room is spinning, he feels off balance.   Constant dry cough for 3+ months, changed to a "productive cough" about 2-3 weeks ago. Since then he's noticed a dry through with cough with a dry sensation to the right submandibular region, this is where cough originates. He  no improvement with drinking anything, but does improve with nasal spray or cough drop. Every morning he coughs up sputum, is better throughout the day.   He does experience belching at night. Doesn't wake during the night coughing. He is taking omeprazole 40 mg (two 20 mg capsules) daily.   Currently managed on Spiriva inhaler, Allegra 180 mg, Singulair 10 mg, albuterol PRN. Previously managed on lisinopril for hypertension but this was changed to losartan about 2 months ago due to cough, his cough has become less frequent since this change. Chest xray completed in April 2022 which revealed linear lung base opacities, greater on left, consistent with atelectasis.   Evaluated by pulmonology in late May 2022 for symptoms of nocturnal awakenings and insomnia, was not determined to have OSA, did not undergo sleep study.   Used Spiriva for about one month, helped SOB and wheezing, uses albuterol during seasonal changes, improves wheezing but no improvement with cough.   Review of  Systems  Constitutional:  Negative for chills, fever and unexpected weight change.  Respiratory:  Positive for cough, shortness of breath and wheezing.   Neurological:  Positive for dizziness.        Past Medical History:  Diagnosis Date   Allergy    Asthma    Diabetes mellitus without complication (HCC)    GERD (gastroesophageal reflux disease)    Hypertension    Palpitations    a. 10/2019: Monitor showing rare PVC's and no significant arrhythmias.     Social History   Socioeconomic History   Marital status: Married    Spouse name: Not on file   Number of children: Not on file   Years of education: Not on file   Highest education level: Not on file  Occupational History   Not on file  Tobacco Use   Smoking status: Former    Packs/day: 4.00    Years: 12.00    Pack years: 48.00    Types: Cigarettes    Quit date: 30    Years since quitting: 46.5   Smokeless tobacco: Never   Tobacco comments:    used to smoke Lucky's  Vaping Use   Vaping Use: Never used  Substance and Sexual Activity   Alcohol use: Yes    Alcohol/week: 1.0 - 2.0 standard drink    Types: 1 - 2 Standard drinks or equivalent per week    Comment: occ   Drug use: No   Sexual activity: Not on file  Other Topics Concern   Not  on file  Social History Narrative   Married.   No children   Retried. Worked as a Furniture conservator/restorer.   Enjoys working on [employer], yard work, gardening.   Social Determinants of Health   Financial Resource Strain: Not on file  Food Insecurity: Not on file  Transportation Needs: Not on file  Physical Activity: Not on file  Stress: Not on file  Social Connections: Not on file  Intimate Partner Violence: Not on file    Past Surgical History:  Procedure Laterality Date   ELBOW SURGERY     KNEE SURGERY Right    TONSILLECTOMY      Family History  Problem Relation Age of Onset   Pulmonary embolism Father 45       Deceased   Heart attack Mother 67        Deceased   Diabetes Maternal Grandmother    Heart attack Sister     Allergies  Allergen Reactions   Coconut Oil Anaphylaxis   Iodine Anaphylaxis   Shellfish Allergy Anaphylaxis   Shellfish-Derived Products Anaphylaxis    Current Outpatient Medications on File Prior to Visit  Medication Sig Dispense Refill   ACCU-CHEK AVIVA PLUS test strip USE AS INSTRUCTED TO TEST BLOOD SUGAR 2 TIMES DAILY 200 strip 0   albuterol (VENTOLIN HFA) 108 (90 Base) MCG/ACT inhaler Inhale 2 puffs into the lungs every 4 (four) hours as needed for wheezing or shortness of breath. 18 g 0   aspirin 81 MG tablet Take 81 mg by mouth daily.     Blood Glucose Monitoring Suppl (ONE TOUCH ULTRA 2) w/Device KIT Use as instructed to blood sugar 2 times daily. 1 each 0   fexofenadine (ALLEGRA) 180 MG tablet Take 180 mg by mouth daily.     Lancets (ONETOUCH ULTRASOFT) lancets Use as instructed to test blood sugar 2 times daily. 100 each 5   losartan (COZAAR) 50 MG tablet Take 1 tablet (50 mg total) by mouth daily. For blood pressure 90 tablet 3   metFORMIN (GLUCOPHAGE) 500 MG tablet Take 2 tablets (1,000 mg total) by mouth 2 (two) times daily with a meal. For diabetes 360 tablet 3   metoprolol succinate (TOPROL-XL) 25 MG 24 hr tablet TAKE 1 TABLET (25 MG TOTAL) BY MOUTH DAILY FOR HIGH BLOOD PRESSURE 90 tablet 2   montelukast (SINGULAIR) 10 MG tablet TAKE 1 TABLET EVERY DAY AT BEDTIME FOR ALLERGIES / ASTHMA 90 tablet 3   omeprazole (PRILOSEC) 20 MG capsule Take 40 mg by mouth daily.     rosuvastatin (CRESTOR) 5 MG tablet TAKE 1 TABLET EVERY EVENING FOR CHOLESTEROL 90 tablet 1   tiotropium (SPIRIVA HANDIHALER) 18 MCG inhalation capsule Place 1 capsule (18 mcg total) into inhaler and inhale daily. 30 capsule 2   tiZANidine (ZANAFLEX) 4 MG tablet TAKE 1 TABLET (4 MG TOTAL) BY MOUTH AT BEDTIME AS NEEDED FOR MUSCLE SPASMS. 60 tablet 0   No current facility-administered medications on file prior to visit.    BP 116/68   Pulse  70   Temp 98.7 F (37.1 C) (Temporal)   Ht 5' 8"  (1.727 m)   Wt 199 lb 6.4 oz (90.4 kg)   SpO2 96%   BMI 30.32 kg/m  Objective:   Physical Exam Eyes:     Extraocular Movements: Extraocular movements intact.  Cardiovascular:     Rate and Rhythm: Normal rate and regular rhythm.  Pulmonary:     Effort: Pulmonary effort is normal.  Breath sounds: Normal breath sounds. No wheezing or rales.  Musculoskeletal:     Cervical back: Neck supple.  Skin:    General: Skin is warm and dry.  Neurological:     Mental Status: He is alert and oriented to person, place, and time.     Cranial Nerves: No cranial nerve deficit.          Assessment & Plan:      This visit occurred during the SARS-CoV-2 public health emergency.  Safety protocols were in place, including screening questions prior to the visit, additional usage of staff PPE, and extensive cleaning of exam room while observing appropriate contact time as indicated for disinfecting solutions.

## 2021-05-20 NOTE — Patient Instructions (Signed)
Stop taking losartan 50 mg for blood pressure.  Start taking amlodipine 5 mg for blood pressure.  Complete xray(s) prior to leaving today. I will notify you of your results once received.  Try Meclizine 12.5 mg every 8 hours as needed for dizziness.   Please update me as discussed.

## 2021-05-20 NOTE — Assessment & Plan Note (Signed)
Acute for several weeks, intermittent, less frequent now.   Negative orthostatic vitals. Labs from last visit grossly benign.  Could be inner ear, will trial Flonase. Also discussed Meclizine use OTC.  He will update.

## 2021-05-20 NOTE — Assessment & Plan Note (Signed)
Controlled on losartan 50 mg, cough is less frequent since switching from lisinopril to losartan.   Given that cough remains, will discontinue losartan 50 mg and try amlodipine 5 mg. He will update.   Negative orthostatic vitals today.

## 2021-06-26 ENCOUNTER — Telehealth: Payer: Self-pay

## 2021-06-26 ENCOUNTER — Other Ambulatory Visit: Payer: Self-pay | Admitting: Primary Care

## 2021-06-26 DIAGNOSIS — E7800 Pure hypercholesterolemia, unspecified: Secondary | ICD-10-CM

## 2021-06-26 NOTE — Telephone Encounter (Signed)
Vm from pt stating he was bitten by something, about 1 wk ago, while working in the yard.  Area is itchy, swollen and is now firm.  He's requesting something be sent to pharmacy.  Plz advise.   Lvm asking pt to call back.  May need OV for eval.

## 2021-06-26 NOTE — Telephone Encounter (Signed)
Left message to return call to our office.  

## 2021-06-27 ENCOUNTER — Other Ambulatory Visit: Payer: Self-pay

## 2021-06-27 ENCOUNTER — Encounter: Payer: Self-pay | Admitting: Nurse Practitioner

## 2021-06-27 ENCOUNTER — Ambulatory Visit (INDEPENDENT_AMBULATORY_CARE_PROVIDER_SITE_OTHER): Payer: Medicare HMO | Admitting: Nurse Practitioner

## 2021-06-27 VITALS — BP 102/60 | HR 76 | Temp 98.3°F | Resp 16 | Ht 69.0 in | Wt 203.5 lb

## 2021-06-27 DIAGNOSIS — L03114 Cellulitis of left upper limb: Secondary | ICD-10-CM

## 2021-06-27 DIAGNOSIS — G479 Sleep disorder, unspecified: Secondary | ICD-10-CM | POA: Diagnosis not present

## 2021-06-27 DIAGNOSIS — F5104 Psychophysiologic insomnia: Secondary | ICD-10-CM

## 2021-06-27 HISTORY — DX: Cellulitis of left upper limb: L03.114

## 2021-06-27 MED ORDER — DOXYCYCLINE HYCLATE 100 MG PO TABS: 100.0000 mg | ORAL_TABLET | Freq: Two times a day (BID) | ORAL | 0 refills | Status: DC

## 2021-06-27 NOTE — Patient Instructions (Signed)
Monitor the redness on your arm. If it is not improving or gets worse in the next two day been re-evaluated.

## 2021-06-27 NOTE — Assessment & Plan Note (Signed)
Patient still having sleep related issues.  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  He has been retired for some times but still has similar schedule.  States that he can go to sleep around 730 to 8:30 PM and will be up several times throughout the night.  Sounds like he is waking up not that he has to use the restroom or any other stimulus.  Did discuss sleep hygiene inclusive of reducing screen time at bedtime or in the bedroom, structured sleep schedule, and avoiding large amounts of fluid and caffeine in the evening time.  Upon further investigation patient states he does not have difficulty going to sleep but does have difficulty staying asleep or going back to sleep once he is woken up.  He has tried melatonin.  We discussed using melatonin at a different schedule.  We will not use melatonin at onset of sleep.  But if he wakes up early enough he can try 1 mg up to 3 mg of melatonin to try to go back to sleep.  He acknowledged

## 2021-06-27 NOTE — Telephone Encounter (Signed)
All visits up to date and labs. Will send in refill today.

## 2021-06-27 NOTE — Assessment & Plan Note (Signed)
Patient states started approximately 2 weeks ago.  Over the past several days he has noticed erythema and edema.  Spouse at bedside states yesterday she noticed the erythema and edema and told me to be seen by provider.  Suspected cellulitis given presentation.  Did drawl borders for at home monitoring of redness.  Will start doxycycline 100 mg twice daily.  Sent to pharmacy of choice.  Instructions given if it does not improve or worsens outside of borders within the next 2 days and need to be reevaluated.  Patient and spouse acknowledged.

## 2021-06-27 NOTE — Telephone Encounter (Signed)
Patient saw Audria Nine today for this issue

## 2021-06-27 NOTE — Progress Notes (Signed)
Acute Office Visit  Subjective:    Patient ID: Brian Mullen, male    DOB: [birth year], 75 y.o.   MRN: [id number]  Chief Complaint  Patient presents with   Insect Bite    Back of left lower arm. Red, pain, swelling, itching present.Noticed it about 2 weeks, worsening since yesterday. No fever. Has tried Calamine lotion, Cortisone 10, Bactin, vinegar. This did not help.    HPI Patient is in today for bite/rash  States started 2 weeks. Has been itching the entire time. Spouse states that it was swollen since yesterday. He states it has been swollen for a couple days. Itching then started hurting yesterday to the touch. Has tried Calamine lotion, hydrocortisone cream, Bactine, vinegar without relief  Past Medical History:  Diagnosis Date   Allergy    Asthma    Diabetes mellitus without complication (HCC)    GERD (gastroesophageal reflux disease)    Hypertension    Palpitations    a. 10/2019: Monitor showing rare PVC's and no significant arrhythmias.     Past Surgical History:  Procedure Laterality Date   ELBOW SURGERY     KNEE SURGERY Right    TONSILLECTOMY      Family History  Problem Relation Age of Onset   Pulmonary embolism Father 88       Deceased   Heart attack Mother 24       Deceased   Diabetes Maternal Grandmother    Heart attack Sister     Social History   Socioeconomic History   Marital status: Married    Spouse name: Not on file   Number of children: Not on file   Years of education: Not on file   Highest education level: Not on file  Occupational History   Not on file  Tobacco Use   Smoking status: Former    Packs/day: 4.00    Years: 12.00    Pack years: 48.00    Types: Cigarettes    Quit date: 1976    Years since quitting: 46.6   Smokeless tobacco: Never   Tobacco comments:    used to smoke Lucky's  Vaping Use   Vaping Use: Never used  Substance and Sexual Activity   Alcohol use: Yes    Alcohol/week: 1.0 - 2.0 standard drink    Types:  1 - 2 Standard drinks or equivalent per week    Comment: occ   Drug use: No   Sexual activity: Not on file  Other Topics Concern   Not on file  Social History Narrative   Married.   No children   Retried. Worked as a Furniture conservator/restorer.   Enjoys working on [employer], yard work, gardening.   Social Determinants of Health   Financial Resource Strain: Not on file  Food Insecurity: Not on file  Transportation Needs: Not on file  Physical Activity: Not on file  Stress: Not on file  Social Connections: Not on file  Intimate Partner Violence: Not on file    Outpatient Medications Prior to Visit  Medication Sig Dispense Refill   ACCU-CHEK AVIVA PLUS test strip USE AS INSTRUCTED TO TEST BLOOD SUGAR 2 TIMES DAILY 200 strip 0   albuterol (VENTOLIN HFA) 108 (90 Base) MCG/ACT inhaler Inhale 2 puffs into the lungs every 4 (four) hours as needed for wheezing or shortness of breath. 18 g 0   amLODipine (NORVASC) 5 MG tablet Take 1 tablet (5 mg total) by mouth daily. For blood pressure 90 tablet  0   aspirin 81 MG tablet Take 81 mg by mouth daily.     Blood Glucose Monitoring Suppl (ONE TOUCH ULTRA 2) w/Device KIT Use as instructed to blood sugar 2 times daily. 1 each 0   fexofenadine (ALLEGRA) 180 MG tablet Take 180 mg by mouth daily.     fluticasone (FLONASE) 50 MCG/ACT nasal spray Place 1 spray into both nostrils 2 (two) times daily. 48 g 0   Lancets (ONETOUCH ULTRASOFT) lancets Use as instructed to test blood sugar 2 times daily. 100 each 5   metFORMIN (GLUCOPHAGE) 500 MG tablet Take 2 tablets (1,000 mg total) by mouth 2 (two) times daily with a meal. For diabetes 360 tablet 3   metoprolol succinate (TOPROL-XL) 25 MG 24 hr tablet TAKE 1 TABLET (25 MG TOTAL) BY MOUTH DAILY FOR HIGH BLOOD PRESSURE 90 tablet 2   montelukast (SINGULAIR) 10 MG tablet TAKE 1 TABLET EVERY DAY AT BEDTIME FOR ALLERGIES / ASTHMA 90 tablet 3   omeprazole (PRILOSEC) 20 MG capsule Take 40 mg by mouth daily.      rosuvastatin (CRESTOR) 5 MG tablet TAKE 1 TABLET EVERY EVENING FOR CHOLESTEROL 90 tablet 1   tiotropium (SPIRIVA HANDIHALER) 18 MCG inhalation capsule Place 1 capsule (18 mcg total) into inhaler and inhale daily. 30 capsule 2   tiZANidine (ZANAFLEX) 4 MG tablet TAKE 1 TABLET (4 MG TOTAL) BY MOUTH AT BEDTIME AS NEEDED FOR MUSCLE SPASMS. 60 tablet 0   No facility-administered medications prior to visit.    Allergies  Allergen Reactions   Coconut Oil Anaphylaxis   Iodine Anaphylaxis   Shellfish Allergy Anaphylaxis   Shellfish-Derived Products Anaphylaxis    Review of Systems  Constitutional:  Negative for chills and fever.  Respiratory:  Positive for shortness of breath. Negative for wheezing.   Cardiovascular:  Negative for chest pain.  Gastrointestinal:  Negative for diarrhea, nausea and vomiting.  Skin:  Positive for color change. Negative for rash.  Psychiatric/Behavioral:         Trouble staying asleep      Objective:    Physical Exam Vitals and nursing note reviewed.  Constitutional:      Appearance: He is obese.  Cardiovascular:     Rate and Rhythm: Normal rate and regular rhythm.  Pulmonary:     Effort: Pulmonary effort is normal.     Breath sounds: Normal breath sounds.  Abdominal:     General: Bowel sounds are normal.  Skin:    General: Skin is warm.     Findings: Erythema and lesion present.          Comments: Erythema and edmea to left lower posterolateral forearm.  Neurological:     Mental Status: He is alert.  Psychiatric:        Mood and Affect: Mood normal.        Behavior: Behavior normal.        Thought Content: Thought content normal.        Judgment: Judgment normal.    BP 102/60   Pulse 76   Temp 98.3 F (36.8 C)   Resp 16   Ht 5' 9"  (1.753 m)   Wt 203 lb 8 oz (92.3 kg)   SpO2 98%   BMI 30.05 kg/m  Wt Readings from Last 3 Encounters:  06/27/21 203 lb 8 oz (92.3 kg)  05/20/21 199 lb 6.4 oz (90.4 kg)  04/07/21 200 lb 6.4 oz (90.9 kg)     Health Maintenance Due  Topic Date Due  Zoster Vaccines- Shingrix (1 of 2) Never done   URINE MICROALBUMIN  11/30/2017   OPHTHALMOLOGY EXAM  12/18/2019   INFLUENZA VACCINE  06/16/2021    There are no preventive care reminders to display for this patient.   Lab Results  Component Value Date   TSH 1.80 07/13/2018   Lab Results  Component Value Date   WBC 7.7 02/26/2021   HGB 13.7 02/26/2021   HCT 42.6 02/26/2021   MCV 92.8 02/26/2021   PLT 240 02/26/2021   Lab Results  Component Value Date   NA 135 02/26/2021   K 4.2 02/26/2021   CO2 27 02/26/2021   GLUCOSE 107 (H) 02/26/2021   BUN 10 02/26/2021   CREATININE 0.89 02/26/2021   BILITOT 0.5 03/28/2021   ALKPHOS 47 03/28/2021   AST 13 03/28/2021   ALT 11 03/28/2021   PROT 6.5 03/28/2021   ALBUMIN 4.3 03/28/2021   CALCIUM 9.0 02/26/2021   ANIONGAP 10 02/26/2021   GFR 83.52 03/06/2020   Lab Results  Component Value Date   CHOL 124 03/28/2021   Lab Results  Component Value Date   HDL 44.50 03/28/2021   Lab Results  Component Value Date   LDLCALC 59 03/28/2021   Lab Results  Component Value Date   TRIG 102.0 03/28/2021   Lab Results  Component Value Date   CHOLHDL 3 03/28/2021   Lab Results  Component Value Date   HGBA1C 7.2 (H) 03/28/2021       Assessment & Plan:   Problem List Items Addressed This Visit       Other   Insomnia, psychophysiological    Patient still having sleep related issues.  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  He has been retired for some times but still has similar schedule.  States that he can go to sleep around 730 to 8:30 PM and will be up several times throughout the night.  Sounds like he is waking up not that he has to use the restroom or any other stimulus.  Did discuss sleep hygiene inclusive of reducing screen time at bedtime or in the bedroom, structured sleep schedule, and  avoiding large amounts of fluid and caffeine in the evening time.  Upon further investigation patient states he does not have difficulty going to sleep but does have difficulty staying asleep or going back to sleep once he is woken up.  He has tried melatonin.  We discussed using melatonin at a different schedule.  We will not use melatonin at onset of sleep.  But if he wakes up early enough he can try 1 mg up to 3 mg of melatonin to try to go back to sleep.  He acknowledged      Cellulitis of left upper extremity - Primary    Patient states started approximately 2 weeks ago.  Over the past several days he has noticed erythema and edema.  Spouse at bedside states yesterday she noticed the erythema and edema and told me to be seen by provider.  Suspected cellulitis given presentation.  Did drawl borders for at home monitoring of redness.  Will start doxycycline 100 mg twice daily.  Sent to pharmacy of choice.  Instructions given if it does not improve or worsens outside of borders within the next 2 days and need to be reevaluated.  Patient and spouse acknowledged.  Relevant Medications   doxycycline (VIBRA-TABS) 100 MG tablet   RESOLVED: Sleep disturbance     No orders of the defined types were placed in this encounter.  This visit occurred during the SARS-CoV-2 public health emergency.  Safety protocols were in place, including screening questions prior to the visit, additional usage of staff PPE, and extensive cleaning of exam room while observing appropriate contact time as indicated for disinfecting solutions.   Romilda Garret, NP

## 2021-07-11 ENCOUNTER — Other Ambulatory Visit: Payer: Self-pay

## 2021-07-11 DIAGNOSIS — E119 Type 2 diabetes mellitus without complications: Secondary | ICD-10-CM

## 2021-07-11 MED ORDER — BD SWAB SINGLE USE REGULAR PADS: MEDICATED_PAD | 3 refills | Status: DC

## 2021-07-11 NOTE — Telephone Encounter (Signed)
Refill request received for swabs ok to refill?

## 2021-07-20 ENCOUNTER — Other Ambulatory Visit: Payer: Self-pay | Admitting: Primary Care

## 2021-07-20 DIAGNOSIS — J4540 Moderate persistent asthma, uncomplicated: Secondary | ICD-10-CM

## 2021-07-20 DIAGNOSIS — I10 Essential (primary) hypertension: Secondary | ICD-10-CM

## 2021-08-06 LAB — HM DIABETES EYE EXAM

## 2021-08-14 ENCOUNTER — Encounter: Payer: Self-pay | Admitting: Primary Care

## 2021-09-01 NOTE — Progress Notes (Signed)
Cardiology Office Note:    Date:  09/02/2021   ID:  Brian Mullen, DOB 23-May-1946, MRN [id number]  PCP:  Pleas Koch, NP   [facility] Providers Cardiologist:  Werner Lean, MD { Click to update primary MD,subspecialty MD or APP then REFRESH:1}    Referring MD: Pleas Koch, NP   CC: Establish with new Cardiologist [place]  History of Present Illness:    Brian Mullen is a 75 y.o. male with a hx of HLD with DM, Mild intermittent asthma, and occasional PVCs.  Seen 02/28/21 with palpitations and associated chest pain.  Has transition from losartan to lisinopril with improvement of symptoms.  Seen 09/02/21.  Patient notes that he is doing pretty good.  In April had some chest discomfort and burning.  Was attributed to dehyrdation.  Stays more hydrated and this has resolved. Notes he has had significant decrease in coffee (now takes half-caf coffee).  Has been working on his deck and fence with no issues outside of leg pain.    NO further chest pain.  Has seasonal breathing issues related to his asthma (usually fall and spring).  His breathing is similar to seasons past.  Leg swelling occurs mainly with prolonged standing but has a little bit of baseline.  Palpitations only if he increases.  No PND/orthopnea, bendopnea, no weight gain. No leg claudication.  Past Medical History:  Diagnosis Date   Allergy    Asthma    Diabetes mellitus without complication (HCC)    GERD (gastroesophageal reflux disease)    Hypertension    Palpitations    a. 10/2019: Monitor showing rare PVC's and no significant arrhythmias.     Past Surgical History:  Procedure Laterality Date   ELBOW SURGERY     KNEE SURGERY Right    TONSILLECTOMY      Current Medications: Current Meds  Medication Sig   ACCU-CHEK AVIVA PLUS test strip USE AS INSTRUCTED TO TEST BLOOD SUGAR 2 TIMES DAILY   albuterol (VENTOLIN HFA) 108 (90 Base) MCG/ACT inhaler INHALE 1 TO 2 PUFFS EVERY 4 TO 6 HOURS  AS NEEDED   Alcohol Swabs (B-D SINGLE USE SWABS REGULAR) PADS Use to check blood sugars.   amLODipine (NORVASC) 5 MG tablet TAKE 1 TABLET EVERY DAY FOR BLOOD PRESSURE   aspirin 81 MG tablet Take 81 mg by mouth daily.   Blood Glucose Monitoring Suppl (ONE TOUCH ULTRA 2) w/Device KIT Use as instructed to blood sugar 2 times daily.   doxycycline (VIBRA-TABS) 100 MG tablet Take 1 tablet (100 mg total) by mouth 2 (two) times daily.   fexofenadine (ALLEGRA) 180 MG tablet Take 180 mg by mouth daily.   fluticasone (FLONASE) 50 MCG/ACT nasal spray Place 1 spray into both nostrils 2 (two) times daily.   Lancets (ONETOUCH ULTRASOFT) lancets Use as instructed to test blood sugar 2 times daily.   metFORMIN (GLUCOPHAGE) 500 MG tablet Take 2 tablets (1,000 mg total) by mouth 2 (two) times daily with a meal. For diabetes   metoprolol succinate (TOPROL-XL) 25 MG 24 hr tablet TAKE 1 TABLET (25 MG TOTAL) BY MOUTH DAILY FOR HIGH BLOOD PRESSURE   montelukast (SINGULAIR) 10 MG tablet TAKE 1 TABLET EVERY DAY AT BEDTIME FOR ALLERGIES / ASTHMA   omeprazole (PRILOSEC) 20 MG capsule Take 40 mg by mouth daily.   rosuvastatin (CRESTOR) 5 MG tablet TAKE 1 TABLET EVERY EVENING FOR CHOLESTEROL   tiotropium (SPIRIVA HANDIHALER) 18 MCG inhalation capsule Place 1 capsule (18 mcg total) into inhaler  and inhale daily.   tiZANidine (ZANAFLEX) 4 MG tablet TAKE 1 TABLET (4 MG TOTAL) BY MOUTH AT BEDTIME AS NEEDED FOR MUSCLE SPASMS.     Allergies:   Coconut oil, Iodine, Shellfish allergy, and Shellfish-derived products   Social History   Socioeconomic History   Marital status: Married    Spouse name: Not on file   Number of children: Not on file   Years of education: Not on file   Highest education level: Not on file  Occupational History   Not on file  Tobacco Use   Smoking status: Former    Packs/day: 4.00    Years: 12.00    Pack years: 48.00    Types: Cigarettes    Quit date: 80    Years since quitting: 46.8    Smokeless tobacco: Never   Tobacco comments:    used to smoke Lucky's  Vaping Use   Vaping Use: Never used  Substance and Sexual Activity   Alcohol use: Yes    Alcohol/week: 1.0 - 2.0 standard drink    Types: 1 - 2 Standard drinks or equivalent per week    Comment: occ   Drug use: No   Sexual activity: Not on file  Other Topics Concern   Not on file  Social History Narrative   Married.   No children   Retried. Worked as a Furniture conservator/restorer.   Enjoys working on [employer], yard work, gardening.   Social Determinants of Health   Financial Resource Strain: Not on file  Food Insecurity: Not on file  Transportation Needs: Not on file  Physical Activity: Not on file  Stress: Not on file  Social Connections: Not on file    Family History: The patient's family history includes Diabetes in his maternal grandmother; Heart attack in his sister; Heart attack (age of onset: 13) in his mother; Pulmonary embolism (age of onset: 45) in his father.  ROS:   Please see the history of present illness.     All other systems reviewed and are negative.  EKGs/Labs/Other Studies Reviewed:    The following studies were reviewed today:  EKG:  EKG is  ordered today.  The ekg ordered today demonstrates  09/02/21: SR 71 WNL no PVCs  Cardiac Event Monitoring: Date: 09/29/2019 Results: Isolated PVCs  Transthoracic Echocardiogram: Date: 10/31/2019 Results:  1. Left ventricular ejection fraction, by visual estimation, is 55 to  60%. The left ventricle has normal function. There is no left ventricular  hypertrophy.   2. The left ventricle has no regional wall motion abnormalities.   3. Global right ventricle has normal systolic function.The right  ventricular size is normal. No increase in right ventricular wall  thickness.   4. Left atrial size was normal.   5. Right atrial size was normal.   6. Presence of pericardial fat pad.   7. The mitral valve is grossly normal. No evidence of  mitral valve  regurgitation.   8. The tricuspid valve is grossly normal. Tricuspid valve regurgitation  is trivial.   9. The aortic valve is tricuspid. Aortic valve regurgitation is not  visualized.  10. The pulmonic valve was not well visualized. Pulmonic valve  regurgitation is not visualized.  11. TR signal is inadequate for assessing pulmonary artery systolic  pressure.  12. The inferior vena cava is normal in size with <50% respiratory  variability, suggesting right atrial pressure of 8 mmHg.     Recent Labs: 02/26/2021: BUN 10; Creatinine, Ser 0.89; Hemoglobin  13.7; Platelets 240; Potassium 4.2; Sodium 135 03/28/2021: ALT 11  Recent Lipid Panel    Component Value Date/Time   CHOL 124 03/28/2021 1205   TRIG 102.0 03/28/2021 1205   HDL 44.50 03/28/2021 1205   CHOLHDL 3 03/28/2021 1205   VLDL 20.4 03/28/2021 1205   LDLCALC 59 03/28/2021 1205   LDLCALC 51 02/11/2018 1532   LDLDIRECT 151.6 09/22/2013 [facility]  or Outside Clinic Studies (OSH):  Date: 03/28/21 Cholesterol 124 HDL 44 LDL 59 Tgs 102 Creatinine 0.89 TSH 1.8  Physical Exam:    VS:  BP 122/62   Pulse 71   Ht 5' 9" (1.753 m)   Wt 200 lb (90.7 kg)   SpO2 96%   BMI 29.53 kg/m     Wt Readings from Last 3 Encounters:  09/02/21 200 lb (90.7 kg)  06/27/21 203 lb 8 oz (92.3 kg)  05/20/21 199 lb 6.4 oz (90.4 kg)     GEN:  Well nourished, well developed in no acute distress HEENT: R Teal's Sign  NECK: No JVD LYMPHATICS: No lymphadenopathy CARDIAC: RRR, no murmurs, rubs, gallops RESPIRATORY:  Clear to auscultation without rales, wheezing or rhonchi  ABDOMEN: Soft, non-tender, non-distended MUSCULOSKELETAL: Trace R leg, +1 L leg;  No deformity  SKIN: Warm and dry NEUROLOGIC:  Alert and oriented x 3 PSYCHIATRIC:  Normal affect   ASSESSMENT:    1. Leg edema   2. Palpitations   3. PVC's (premature ventricular contractions)   4. Hypertension associated with diabetes ([place])   5.  Hyperlipidemia associated with type 2 diabetes mellitus ([place])    PLAN:    LE swelling PVCs - resolved on succinate 25 mg PO daily HTN with DM HLD with DM  with LDL of 59 on rosuvastatin 5 mg PO daily - patient denies history of HTN and notes that the DM indication is the reason for his prior ACE, ARB (caused CP) - will start with compression stockings - if no improvement and still has LE swelling (new since working on his deck and since starting norvasc) can do trial off norvasc with amb BP checks - if persistent despite this we will get a BNP and BMP  Planned for 02/13/22 f/u   Medication Adjustments/Labs and Tests Ordered: Current medicines are reviewed at length with the patient today.  Concerns regarding medicines are outlined above.  Orders Placed This Encounter  Procedures   EKG 12-Lead   No orders of the defined types were placed in this encounter.   Patient Instructions  Medication Instructions:  Your physician recommends that you continue on your current medications as directed. Please refer to the Current Medication list given to you today.  *If you need a refill on your cardiac medications before your next appointment, please call your pharmacy*   Lab Work: NONE If you have labs (blood work) drawn today and your tests are completely normal, you will receive your results only by: [place] (if you have MyChart) OR A paper copy in the mail If you have any lab test that is abnormal or we need to change your treatment, we will call you to review the results.   Testing/Procedures: NONE   Follow-Up: At [facility], you and your health needs are our priority.  As part of our continuing mission to provide you with exceptional heart care, we have created designated Provider Care Teams.  These Care Teams include your primary Cardiologist (physician) and Advanced Practice Providers (APPs -  Physician Assistants and  Nurse Practitioners) who all work together to  provide you with the care you need, when you need it.    Your next appointment:   5-6 month(s)  The format for your next appointment:   In Person  Provider:   Rudean Haskell, MD   Other Instructions Please check your BP and call in readings or send in a log through My Chart.     Signed, Werner Lean, MD  09/02/2021 11:08 AM    [place]

## 2021-09-02 ENCOUNTER — Encounter: Payer: Self-pay | Admitting: Internal Medicine

## 2021-09-02 ENCOUNTER — Ambulatory Visit: Payer: Medicare HMO | Admitting: Internal Medicine

## 2021-09-02 ENCOUNTER — Other Ambulatory Visit: Payer: Self-pay

## 2021-09-02 VITALS — BP 122/62 | HR 71 | Ht 69.0 in | Wt 200.0 lb

## 2021-09-02 DIAGNOSIS — R600 Localized edema: Secondary | ICD-10-CM | POA: Insufficient documentation

## 2021-09-02 DIAGNOSIS — E785 Hyperlipidemia, unspecified: Secondary | ICD-10-CM

## 2021-09-02 DIAGNOSIS — I493 Ventricular premature depolarization: Secondary | ICD-10-CM | POA: Diagnosis not present

## 2021-09-02 DIAGNOSIS — E1159 Type 2 diabetes mellitus with other circulatory complications: Secondary | ICD-10-CM

## 2021-09-02 DIAGNOSIS — J4540 Moderate persistent asthma, uncomplicated: Secondary | ICD-10-CM | POA: Diagnosis not present

## 2021-09-02 DIAGNOSIS — E1169 Type 2 diabetes mellitus with other specified complication: Secondary | ICD-10-CM | POA: Diagnosis not present

## 2021-09-02 DIAGNOSIS — R002 Palpitations: Secondary | ICD-10-CM

## 2021-09-02 DIAGNOSIS — I152 Hypertension secondary to endocrine disorders: Secondary | ICD-10-CM

## 2021-09-02 NOTE — Patient Instructions (Addendum)
Medication Instructions:  Your physician recommends that you continue on your current medications as directed. Please refer to the Current Medication list given to you today.  *If you need a refill on your cardiac medications before your next appointment, please call your pharmacy*   Lab Work: NONE If you have labs (blood work) drawn today and your tests are completely normal, you will receive your results only by: MyChart Message (if you have MyChart) OR A paper copy in the mail If you have any lab test that is abnormal or we need to change your treatment, we will call you to review the results.   Testing/Procedures: NONE   Follow-Up: At [facility], you and your health needs are our priority.  As part of our continuing mission to provide you with exceptional heart care, we have created designated Provider Care Teams.  These Care Teams include your primary Cardiologist (physician) and Advanced Practice Providers (APPs -  Physician Assistants and Nurse Practitioners) who all work together to provide you with the care you need, when you need it.    Your next appointment:   5-6 month(s)  The format for your next appointment:   In Person  Provider:   Riley Lam, MD   Other Instructions Please check your BP and call in readings or send in a log through My Chart.

## 2021-09-12 ENCOUNTER — Other Ambulatory Visit: Payer: Self-pay | Admitting: Primary Care

## 2021-09-12 DIAGNOSIS — I10 Essential (primary) hypertension: Secondary | ICD-10-CM

## 2021-09-23 DIAGNOSIS — E1165 Type 2 diabetes mellitus with hyperglycemia: Secondary | ICD-10-CM | POA: Diagnosis not present

## 2021-09-30 ENCOUNTER — Ambulatory Visit (INDEPENDENT_AMBULATORY_CARE_PROVIDER_SITE_OTHER): Payer: Medicare HMO | Admitting: Primary Care

## 2021-09-30 ENCOUNTER — Encounter: Payer: Self-pay | Admitting: Primary Care

## 2021-09-30 ENCOUNTER — Ambulatory Visit: Payer: Medicare HMO | Admitting: Primary Care

## 2021-09-30 ENCOUNTER — Other Ambulatory Visit: Payer: Self-pay

## 2021-09-30 VITALS — BP 124/62 | HR 70 | Temp 98.6°F | Ht 69.0 in | Wt 204.0 lb

## 2021-09-30 DIAGNOSIS — M25561 Pain in right knee: Secondary | ICD-10-CM | POA: Diagnosis not present

## 2021-09-30 DIAGNOSIS — R600 Localized edema: Secondary | ICD-10-CM | POA: Diagnosis not present

## 2021-09-30 DIAGNOSIS — E119 Type 2 diabetes mellitus without complications: Secondary | ICD-10-CM

## 2021-09-30 DIAGNOSIS — R42 Dizziness and giddiness: Secondary | ICD-10-CM | POA: Diagnosis not present

## 2021-09-30 DIAGNOSIS — M25562 Pain in left knee: Secondary | ICD-10-CM

## 2021-09-30 DIAGNOSIS — Z23 Encounter for immunization: Secondary | ICD-10-CM | POA: Diagnosis not present

## 2021-09-30 DIAGNOSIS — G8929 Other chronic pain: Secondary | ICD-10-CM | POA: Diagnosis not present

## 2021-09-30 DIAGNOSIS — E1165 Type 2 diabetes mellitus with hyperglycemia: Secondary | ICD-10-CM | POA: Diagnosis not present

## 2021-09-30 DIAGNOSIS — J449 Chronic obstructive pulmonary disease, unspecified: Secondary | ICD-10-CM | POA: Diagnosis not present

## 2021-09-30 LAB — POCT GLYCOSYLATED HEMOGLOBIN (HGB A1C): Hemoglobin A1C: 7.4 % — AB (ref 4.0–5.6)

## 2021-09-30 LAB — MICROALBUMIN / CREATININE URINE RATIO
Creatinine,U: 33.7 mg/dL
Microalb Creat Ratio: 2.1 mg/g (ref 0.0–30.0)
Microalb, Ur: <0.7 mg/dL (ref 0.0–1.9)

## 2021-09-30 NOTE — Progress Notes (Signed)
Established Patient Office Visit  Subjective:  Patient ID: Brian Mullen, male    DOB: 03-25-1946  Age: 75 y.o. MRN: [id number]  CC: No chief complaint on file.   HPI Brian Mullen presents for diabetes follow up and to discuss chronic dizziness.  Current diabetes medications include 1000 mg Metformin twice daily. He is adherent to this dose.    He is checking his/her blood glucose once daily in the mornings and is getting readings of 131-146.  Last A1C: 7.4 today in office Last Eye Exam: UTD 08/07/20 Last Foot Exam: Due, done in office today Pneumonia Vaccination: UTD Urine Microalbumin: Due, will order today Statin: Crestor 5 mg daily  BP Readings from Last 3 Encounters:  09/30/21 124/62  09/02/21 122/62  06/27/21 102/60    Dietary changes since last visit: Eating less related to poor appetite. One or two meals per day, proteins and mix of vegetables and fruits. Snacks on cookies and apple pies.    Exercise: No regular exercise. He has been less active since contracting COVID-19  He complains of daily dizziness, that started , it has gotten progressively worse. It now occurs at rest which is new. It used to only happen with positional changes. It only lasts seconds and is self-limiting. He feels off-balance, denies the feeling of the room spinning. He has not sustained any falls. He has been diagnosed by ENT for bilateral tinnitus, no recent visit. He has tried Dramamine and Flonase without relief. He was prescribed Meclizine but never tried this.     Past Surgical History:  Procedure Laterality Date   ELBOW SURGERY     KNEE SURGERY Right    TONSILLECTOMY      Family History  Problem Relation Age of Onset   Pulmonary embolism Father 69       Deceased   Heart attack Mother 46       Deceased   Diabetes Maternal Grandmother    Heart attack Sister     Social History   Socioeconomic History   Marital status: Married    Spouse name: Not on file   Number of  children: Not on file   Years of education: Not on file   Highest education level: Not on file  Occupational History   Not on file  Tobacco Use   Smoking status: Former    Packs/day: 4.00    Years: 12.00    Pack years: 48.00    Types: Cigarettes    Quit date: 1976    Years since quitting: 46.9   Smokeless tobacco: Never   Tobacco comments:    used to smoke Lucky's  Vaping Use   Vaping Use: Never used  Substance and Sexual Activity   Alcohol use: Yes    Alcohol/week: 1.0 - 2.0 standard drink    Types: 1 - 2 Standard drinks or equivalent per week    Comment: occ   Drug use: No   Sexual activity: Not on file  Other Topics Concern   Not on file  Social History Narrative   Married.   No children   Retried. Worked as a Furniture conservator/restorer.   Enjoys working on [employer], yard work, gardening.   Social Determinants of Health   Financial Resource Strain: Not on file  Food Insecurity: Not on file  Transportation Needs: Not on file  Physical Activity: Not on file  Stress: Not on file  Social Connections: Not on file  Intimate Partner Violence: Not on file  Outpatient Medications Prior to Visit  Medication Sig Dispense Refill   ACCU-CHEK AVIVA PLUS test strip USE AS INSTRUCTED TO TEST BLOOD SUGAR 2 TIMES DAILY 200 strip 0   albuterol (VENTOLIN HFA) 108 (90 Base) MCG/ACT inhaler INHALE 1 TO 2 PUFFS EVERY 4 TO 6 HOURS AS NEEDED 1 each 0   Alcohol Swabs (B-D SINGLE USE SWABS REGULAR) PADS Use to check blood sugars. 200 each 3   amLODipine (NORVASC) 5 MG tablet TAKE 1 TABLET EVERY DAY FOR BLOOD PRESSURE 90 tablet 1   aspirin 81 MG tablet Take 81 mg by mouth daily.     Blood Glucose Monitoring Suppl (ONE TOUCH ULTRA 2) w/Device KIT Use as instructed to blood sugar 2 times daily. 1 each 0   doxycycline (VIBRA-TABS) 100 MG tablet Take 1 tablet (100 mg total) by mouth 2 (two) times daily. 14 tablet 0   fexofenadine (ALLEGRA) 180 MG tablet Take 180 mg by mouth daily.      fluticasone (FLONASE) 50 MCG/ACT nasal spray Place 1 spray into both nostrils 2 (two) times daily. 48 g 0   Lancets (ONETOUCH ULTRASOFT) lancets Use as instructed to test blood sugar 2 times daily. 100 each 5   metFORMIN (GLUCOPHAGE) 500 MG tablet Take 2 tablets (1,000 mg total) by mouth 2 (two) times daily with a meal. For diabetes 360 tablet 3   metoprolol succinate (TOPROL-XL) 25 MG 24 hr tablet TAKE 1 TABLET DAILY FOR HIGH BLOOD PRESSURE 90 tablet 1   montelukast (SINGULAIR) 10 MG tablet TAKE 1 TABLET EVERY DAY AT BEDTIME FOR ALLERGIES / ASTHMA 90 tablet 3   omeprazole (PRILOSEC) 20 MG capsule Take 40 mg by mouth daily.     rosuvastatin (CRESTOR) 5 MG tablet TAKE 1 TABLET EVERY EVENING FOR CHOLESTEROL 90 tablet 1   tiotropium (SPIRIVA HANDIHALER) 18 MCG inhalation capsule Place 1 capsule (18 mcg total) into inhaler and inhale daily. 30 capsule 2   tiZANidine (ZANAFLEX) 4 MG tablet TAKE 1 TABLET (4 MG TOTAL) BY MOUTH AT BEDTIME AS NEEDED FOR MUSCLE SPASMS. 60 tablet 0   No facility-administered medications prior to visit.    Allergies  Allergen Reactions   Coconut Oil Anaphylaxis   Iodine Anaphylaxis   Shellfish Allergy Anaphylaxis   Shellfish-Derived Products Anaphylaxis    ROS Review of Systems  Constitutional: Negative.   Respiratory:  Negative for chest tightness and shortness of breath.   Cardiovascular:  Negative for palpitations.  Gastrointestinal: Negative.   Musculoskeletal:  Positive for arthralgias.       Bilateral knees, left knee with increased laxity  Skin: Negative.   Neurological:  Positive for dizziness.       Chronic, persistent, progressively frequent episodes of dizziness     Objective:    Physical Exam Constitutional:      Appearance: Normal appearance.  HENT:     Head: Normocephalic.  Cardiovascular:     Pulses: Normal pulses.     Heart sounds: Normal heart sounds.  Pulmonary:     Effort: Pulmonary effort is normal.     Breath sounds: Normal  breath sounds.  Musculoskeletal:        General: No swelling.     Cervical back: Normal range of motion.  Skin:    General: Skin is warm and dry.     Capillary Refill: Capillary refill takes more than 3 seconds.  Neurological:     General: No focal deficit present.     Mental Status: He is alert and oriented to  person, place, and time.     Cranial Nerves: No cranial nerve deficit.  Psychiatric:        Mood and Affect: Mood normal.        Behavior: Behavior normal.    There were no vitals taken for this visit. Wt Readings from Last 3 Encounters:  09/02/21 200 lb (90.7 kg)  06/27/21 203 lb 8 oz (92.3 kg)  05/20/21 199 lb 6.4 oz (90.4 kg)     Health Maintenance Due  Topic Date Due   Zoster Vaccines- Shingrix (1 of 2) Never done   URINE MICROALBUMIN  11/30/2017   INFLUENZA VACCINE  06/16/2021   FOOT EXAM  09/06/2021   HEMOGLOBIN A1C  09/28/2021    There are no preventive care reminders to display for this patient.  Lab Results  Component Value Date   TSH 1.80 07/13/2018   Lab Results  Component Value Date   WBC 7.7 02/26/2021   HGB 13.7 02/26/2021   HCT 42.6 02/26/2021   MCV 92.8 02/26/2021   PLT 240 02/26/2021   Lab Results  Component Value Date   NA 135 02/26/2021   K 4.2 02/26/2021   CO2 27 02/26/2021   GLUCOSE 107 (H) 02/26/2021   BUN 10 02/26/2021   CREATININE 0.89 02/26/2021   BILITOT 0.5 03/28/2021   ALKPHOS 47 03/28/2021   AST 13 03/28/2021   ALT 11 03/28/2021   PROT 6.5 03/28/2021   ALBUMIN 4.3 03/28/2021   CALCIUM 9.0 02/26/2021   ANIONGAP 10 02/26/2021   GFR 83.52 03/06/2020   Lab Results  Component Value Date   CHOL 124 03/28/2021   Lab Results  Component Value Date   HDL 44.50 03/28/2021   Lab Results  Component Value Date   LDLCALC 59 03/28/2021   Lab Results  Component Value Date   TRIG 102.0 03/28/2021   Lab Results  Component Value Date   CHOLHDL 3 03/28/2021   Lab Results  Component Value Date   HGBA1C 7.2 (H)  03/28/2021      Assessment & Plan:   Problem List Items Addressed This Visit   None   No orders of the defined types were placed in this encounter.   Follow-up: No follow-ups on file.    Ninfa Meeker, RN

## 2021-09-30 NOTE — Assessment & Plan Note (Addendum)
Chronic, bilateral   Exam notable for increased laxity of left knee.  Referral for physical therapy placed, discussed considering orthopedic consultation in the future. He agrees with this plan.  I evaluated patient, was consulted regarding treatment, and agree with assessment and plan per Kathaleen Maser, RN, DNP student.   Mayra Reel, NP-C

## 2021-09-30 NOTE — Assessment & Plan Note (Addendum)
Progressing. Neuro exam today unremarkable. Orthostatic vitals negative historically. No syncope.   We discussed etiologies including inner ear and intracranial processes.  Head CT w/o contrast ordered Referral to ENT placed.  CT pending. Will follow up via MyChart.  I evaluated patient, was consulted regarding treatment, and agree with assessment and plan per Kathaleen Maser, RN, DNP student.   Mayra Reel, NP-C

## 2021-09-30 NOTE — Assessment & Plan Note (Addendum)
Controlled, A1C 7.4 in the office today, which is within goal of <8.  Pneumonia vaccination complete. Eye exam UTD Foot exam done today.  Encouraged making healthy changes to diet and increasing exercise.  Continue Metformin 1000 mg daily.  Urine microalbumin pending.  Follow up in 6 months.    I evaluated patient, was consulted regarding treatment, and agree with assessment and plan per Kathaleen Maser, RN, DNP student.   Mayra Reel, NP-C

## 2021-09-30 NOTE — Patient Instructions (Addendum)
It was great to see you today! Stop by the lab prior to leaving today. I will notify you of your results once received.  Stop taking Amlodipine for one week to see if the swelling in your legs improves. Please notify me and your cardiologist.

## 2021-09-30 NOTE — Assessment & Plan Note (Addendum)
Bilateral, intermittent Could be due to Amlodipine, also consider arthritis or venous insufficiency.   We discussed a trial of holding Amlodipine 5 mg for 1 week to monitor for resolution of edema, while also continuing to monitor BP closely at home. Continue compression socks.   He will update my MyChart about LE edema and if elevation in BP occurs.   I evaluated patient, was consulted regarding treatment, and agree with assessment and plan per Kathaleen Maser, RN, DNP student.   Mayra Reel, NP-C

## 2021-09-30 NOTE — Progress Notes (Signed)
Subjective:    Patient ID: Brian Mullen, male    DOB: [birth year], 75 y.o.   MRN: [id number]  HPI  Brian Mullen is a very pleasant 75 y.o. male with a history of asthma, type 2 diabetes, hyperlipidemia, COPD who presents today for follow up of diabetes. He would also like to discuss multiple concerns.  1) Type 2 Diabetes:  Current medications include: Metformin 500 mg, (2 tablets twice daily)  He is checking his blood glucose 1 times daily and is getting readings of: 130-140's.   Last A1C: 7.2 in May 2022, 7.4 today. Last Eye Exam: UTD Last Foot Exam: Due today Pneumonia Vaccination: 2020 Urine Microalbumin: Due Statin: Crestor   Dietary changes since last visit: Poor diet including pizza, cookies, junk food, some fruit and vegetables.    Exercise: None  2) Dizziness: Chronic and intermittent for years, occurred mostly with positional changes. Over the last few months he's noticed progression with daily symptoms with positional change dizziness, and is now occurring with rest. A few nights ago he was sitting at the dinner table, had an episode of dizziness that lasted a few seconds.   Evaluated by me for dizziness in July 2022, orthostatic vitals were negative that day, discussed use of Flonase and Meclizine, recommended he notify me if dizziness persisted.   Today he endorses he never tried the Meclizine. He feels like he is off balance with episodes. He denies unilateral weakness, speech changes, severe headaches. Evaluated by ENT in 2020 for chronic tinnitus.   3) Lower Extremity Edema: Chronic. Evaluated by cardiology in October 2022, lower extremity edema was discussed, recommendations were to resume compression socks and to consider removal of amlodipine.   Today he endorses continued lower extremity edema which is mostly located to the right ankle up to mid shin. A few days ago he noticed bilateral lower extremity swelling up to his thighs.   He is compliant to his  compression socks daily and has not noticed much improvement.   4) Chronic Knee Pain: Chronic bilateral knee pain, mostly to left knee which will "lock" when walking often. Progressing over the last few months. He is overall less active in general since Covid-19 infection.   He is interested in physical therapy. History of fall years ago, dislocated patella years ago bilaterally. He was evaluated by orthopedics in August 2020 for symptoms of left knee that "separates" with walking. He completed xrays which showed moderate severe arthritis, worse on left and mild to moderate varus deformities.   During this visit it was recommended he undergo surgery for which he kindly declined. He was provided with bilateral economy hinged braces.   BP Readings from Last 3 Encounters:  09/30/21 124/62  09/02/21 122/62  06/27/21 102/60       Review of Systems  Cardiovascular:  Positive for leg swelling. Negative for chest pain.  Musculoskeletal:  Positive for arthralgias.  Skin:  Negative for color change.  Neurological:  Positive for dizziness.        Past Medical History:  Diagnosis Date   Allergy    Asthma    Diabetes mellitus without complication (HCC)    GERD (gastroesophageal reflux disease)    Hypertension    Palpitations    a. 10/2019: Monitor showing rare PVC's and no significant arrhythmias.     Social History   Socioeconomic History   Marital status: Married    Spouse name: Not on file   Number of children: Not on file   Years  of education: Not on file   Highest education level: Not on file  Occupational History   Not on file  Tobacco Use   Smoking status: Former    Packs/day: 4.00    Years: 12.00    Pack years: 48.00    Types: Cigarettes    Quit date: 41    Years since quitting: 46.9   Smokeless tobacco: Never   Tobacco comments:    used to smoke Lucky's  Vaping Use   Vaping Use: Never used  Substance and Sexual Activity   Alcohol use: Yes    Alcohol/week:  1.0 - 2.0 standard drink    Types: 1 - 2 Standard drinks or equivalent per week    Comment: occ   Drug use: No   Sexual activity: Not on file  Other Topics Concern   Not on file  Social History Narrative   Married.   No children   Retried. Worked as a Furniture conservator/restorer.   Enjoys working on [employer], yard work, gardening.   Social Determinants of Health   Financial Resource Strain: Not on file  Food Insecurity: Not on file  Transportation Needs: Not on file  Physical Activity: Not on file  Stress: Not on file  Social Connections: Not on file  Intimate Partner Violence: Not on file    Past Surgical History:  Procedure Laterality Date   ELBOW SURGERY     KNEE SURGERY Right    TONSILLECTOMY      Family History  Problem Relation Age of Onset   Pulmonary embolism Father 10       Deceased   Heart attack Mother 100       Deceased   Diabetes Maternal Grandmother    Heart attack Sister     Allergies  Allergen Reactions   Coconut Oil Anaphylaxis   Iodine Anaphylaxis   Shellfish Allergy Anaphylaxis   Shellfish-Derived Products Anaphylaxis    Current Outpatient Medications on File Prior to Visit  Medication Sig Dispense Refill   ACCU-CHEK AVIVA PLUS test strip USE AS INSTRUCTED TO TEST BLOOD SUGAR 2 TIMES DAILY 200 strip 0   albuterol (VENTOLIN HFA) 108 (90 Base) MCG/ACT inhaler INHALE 1 TO 2 PUFFS EVERY 4 TO 6 HOURS AS NEEDED 1 each 0   Alcohol Swabs (B-D SINGLE USE SWABS REGULAR) PADS Use to check blood sugars. 200 each 3   amLODipine (NORVASC) 5 MG tablet TAKE 1 TABLET EVERY DAY FOR BLOOD PRESSURE 90 tablet 1   aspirin 81 MG tablet Take 81 mg by mouth daily.     Blood Glucose Monitoring Suppl (ONE TOUCH ULTRA 2) w/Device KIT Use as instructed to blood sugar 2 times daily. 1 each 0   fexofenadine (ALLEGRA) 180 MG tablet Take 180 mg by mouth daily.     fluticasone (FLONASE) 50 MCG/ACT nasal spray Place 1 spray into both nostrils 2 (two) times daily. 48 g 0    Lancets (ONETOUCH ULTRASOFT) lancets Use as instructed to test blood sugar 2 times daily. 100 each 5   metFORMIN (GLUCOPHAGE) 500 MG tablet Take 2 tablets (1,000 mg total) by mouth 2 (two) times daily with a meal. For diabetes 360 tablet 3   metoprolol succinate (TOPROL-XL) 25 MG 24 hr tablet TAKE 1 TABLET DAILY FOR HIGH BLOOD PRESSURE 90 tablet 1   montelukast (SINGULAIR) 10 MG tablet TAKE 1 TABLET EVERY DAY AT BEDTIME FOR ALLERGIES / ASTHMA 90 tablet 3   omeprazole (PRILOSEC) 20 MG capsule Take 40 mg  by mouth daily.     rosuvastatin (CRESTOR) 5 MG tablet TAKE 1 TABLET EVERY EVENING FOR CHOLESTEROL 90 tablet 1   tiotropium (SPIRIVA HANDIHALER) 18 MCG inhalation capsule Place 1 capsule (18 mcg total) into inhaler and inhale daily. 30 capsule 2   tiZANidine (ZANAFLEX) 4 MG tablet TAKE 1 TABLET (4 MG TOTAL) BY MOUTH AT BEDTIME AS NEEDED FOR MUSCLE SPASMS. 60 tablet 0   No current facility-administered medications on file prior to visit.    BP 124/62   Pulse 70   Temp 98.6 F (37 C) (Temporal)   Ht _0  (1.753 m)   Wt 204 lb (92.5 kg)   SpO2 98%   BMI 30.13 kg/m  Objective:   Physical Exam Eyes:     Extraocular Movements: Extraocular movements intact.  Cardiovascular:     Rate and Rhythm: Normal rate and regular rhythm.  Pulmonary:     Effort: Pulmonary effort is normal.     Breath sounds: Normal breath sounds. No wheezing or rales.  Musculoskeletal:     Cervical back: Neck supple.     Right knee: No swelling or deformity.     Left knee: No swelling or deformity. Decreased range of motion. No tenderness.     Comments: Ambulates without assistance.  Skin:    General: Skin is warm and dry.  Neurological:     Mental Status: He is alert and oriented to person, place, and time.          Assessment & Plan:      This visit occurred during the SARS-CoV-2 public health emergency.  Safety protocols were in place, including screening questions prior to the visit, additional usage  of staff PPE, and extensive cleaning of exam room while observing appropriate contact time as indicated for disinfecting solutions.

## 2021-10-14 ENCOUNTER — Telehealth: Payer: Medicare HMO | Admitting: Primary Care

## 2021-10-14 NOTE — Telephone Encounter (Signed)
Noted  

## 2021-10-14 NOTE — Telephone Encounter (Signed)
Spoke with [employer] for additional information required for CT head. I was told this was already approved from 10/01/21. I asked for an updated date range and approval was updated from 10/14/21 through 11/13/21.  Authorization # [id number]

## 2021-10-20 NOTE — Telephone Encounter (Signed)
Pt called checking on status of a head scan.

## 2021-10-22 DIAGNOSIS — M1712 Unilateral primary osteoarthritis, left knee: Secondary | ICD-10-CM | POA: Diagnosis not present

## 2021-10-22 DIAGNOSIS — M1711 Unilateral primary osteoarthritis, right knee: Secondary | ICD-10-CM | POA: Diagnosis not present

## 2021-10-23 NOTE — Telephone Encounter (Signed)
Per scheduling they are really backed up on CTs in [place], he is sch at [facility] on 11/19/21 @ 230p (215p check in), no prep...if he would like it sooner just give him our number and I could get him in [place] next week  Left a detailed message for patient to call back and let us know what he decides to do.   Patient given the appt information for 11/19/21 at [facility] that they can get him in sooner in [place] if he decides to go there.  I gave him the schedulers number [phone]) to call them back for a sooner appt.

## 2021-10-29 DIAGNOSIS — M2351 Chronic instability of knee, right knee: Secondary | ICD-10-CM | POA: Diagnosis not present

## 2021-10-29 DIAGNOSIS — M1711 Unilateral primary osteoarthritis, right knee: Secondary | ICD-10-CM | POA: Diagnosis not present

## 2021-11-12 NOTE — Telephone Encounter (Signed)
Kiley from [facility] called in stated authorization expired for pt insurance  for CT scan . Please Advise # [phone] ext [id number]

## 2021-11-13 NOTE — Telephone Encounter (Signed)
Anastasiya, are you able to please help with this? I tried to call in the [facility] and they were already closed.  This patient is scheduled for 11/19/21 and his Authorization has expired as of today 11/13/21.  This message was not sent with high priority so I did not see it until now. I do want him to have to be rescheduled since he's already waited so long.   Just need to get the approval extended. Can you call [employer] to have them extend the approval date? If not you can see If someone in the referrals group can help.   View Appointment Request [id number]  Procedure Status Authorized Created On 10/01/2021 2:37:20 PM Member ID [id number] Member Information TELVIN, REINDERS Procedure 85027 - CT HEAD BRAIN WO DYE Diagnosis 1 R42 - Dizziness and giddiness Facility [facility] Address [address] [place], [place] [address] Ordering Physician [name] Physician Phone [phone] Phys. Direct Phone [phone] Physician Fax [phone] Email Address [email or web address] Ordering Provider's Facility Information Facility [facility] Address [address] [place], [place] [address]  File Name Date Uploaded File Size Uploaded By [id number] 10/01/2021 2:41:22 PM [id number] System User [employer] Phone: [phone]   Toll-free: [phone]

## 2021-11-18 ENCOUNTER — Telehealth: Payer: Self-pay | Admitting: Primary Care

## 2021-11-18 NOTE — Telephone Encounter (Signed)
Brian Mullen called in and wanted to know about getting his medication list sent to [name], the fax number is [phone]. Also he stated that [name] took him off amlodipine and he does not have the cough and his BP is regular.

## 2021-11-18 NOTE — Telephone Encounter (Signed)
Had to repeat the authorization Approved for [facility]-CT dept Auth [id number] Exp 05/2022  Pt rescheduled his CT scan to February   Nothing further needed.

## 2021-11-19 ENCOUNTER — Ambulatory Visit: Payer: Medicare HMO

## 2021-11-19 DIAGNOSIS — M1712 Unilateral primary osteoarthritis, left knee: Secondary | ICD-10-CM | POA: Diagnosis not present

## 2021-11-19 NOTE — Telephone Encounter (Signed)
Great to hear regarding his cough!  How about the swelling to his legs? Has that reduced since stopping amlodipine?  How are his BP readings?

## 2021-11-20 NOTE — Telephone Encounter (Signed)
Noted.  Glad to hear that swelling has reduced.  BP appears stable.

## 2021-11-20 NOTE — Telephone Encounter (Signed)
Patient states swelling has greatly reduced after stopping the amlodipine.  His last reading was 129/60 with hr 60 128/63 hr 75 , 125/69 hr 79 and 132/63 hr 72

## 2021-11-24 NOTE — Telephone Encounter (Signed)
Brian Mullen called in and wanted to know about getting his medication list sent to [facility], the fax number is [phone].

## 2021-11-26 DIAGNOSIS — M1712 Unilateral primary osteoarthritis, left knee: Secondary | ICD-10-CM | POA: Diagnosis not present

## 2021-11-26 NOTE — Telephone Encounter (Signed)
I am not sure how to make this request possible. Joellen, can you help?

## 2021-11-26 NOTE — Telephone Encounter (Signed)
Information faxed as requested.

## 2021-12-01 NOTE — Telephone Encounter (Signed)
Mr. Zaino called in and stated that it was not the medication list they needed it was all new prescriptions due to he is new to the company.    Encourage patient to contact the pharmacy for refills or they can request refills through [facility]  LAST APPOINTMENT DATE:  Please schedule appointment if longer than 1 year  NEXT APPOINTMENT DATE:  MEDICATION: montelukast (SINGULAIR) 10 MG tablet  Is the patient out of medication? yes  PH[facility]ACY[facility]  Let patient know to contact pharmacy at the end of the day to make sure medication is ready.  Please notify patient to allow 48-72 hours to process  CLINICAL FILLS OUT ALL BELOW:   LAST REFILL:  QTY:  REFILL DATE:    OTHER COMMENTS:    Okay for refill?  Please advise

## 2021-12-02 NOTE — Telephone Encounter (Signed)
Will you please confirm his mail order pharmacy?  Also, needs ALL medications sent over, correct?  Not just Singulair?

## 2021-12-03 DIAGNOSIS — M1712 Unilateral primary osteoarthritis, left knee: Secondary | ICD-10-CM | POA: Diagnosis not present

## 2021-12-08 ENCOUNTER — Telehealth: Payer: Self-pay | Admitting: Primary Care

## 2021-12-08 DIAGNOSIS — J4540 Moderate persistent asthma, uncomplicated: Secondary | ICD-10-CM

## 2021-12-08 NOTE — Telephone Encounter (Signed)
Pt returning your call

## 2021-12-08 NOTE — Telephone Encounter (Signed)
Spoke to patient by telephone and was advised that he was told that the name of mail order pharmacy is [facility]. Advised patient that I do not see a pharmacy by that name in our system. Patient gave me four different telephone numbers and one matched up to [facility]. Patient is going to call his insurance company and confirm what the name of his mail order pharmacy is and call the office back.  Patient stated that he needs a refill on his Singulair Metoprolol Rosuvastatin Patient stated that he was on Lisinopril and was switched to Amlodipine because of a cough. Patient stated that he stopped the amlodipine because of dryness in his throat. Patient stated that he started back on the amlodipine because his irregular heartbeat started back. Patient stated that he needs to know what else can be done about the dryness in his throat since the amlodipine is causing the problem with his throat.

## 2021-12-08 NOTE — Telephone Encounter (Signed)
Left message on VM per DPR advising pt I was calling him back. Also asked him to provide an actual list of the medications he needs sent to the mail order pharmacy as he has diabetic supplies as well as medications on the list.

## 2021-12-08 NOTE — Telephone Encounter (Signed)
Pt called asking for a call back to discuss medication changes. Pt also stated that he needs all his medication going to [facility] [phone]). Pt states that he is out of some medication and will be needing them. Please advise.

## 2021-12-08 NOTE — Telephone Encounter (Addendum)
Pt spoke to [facility] about his medications. York Spaniel they told him to use [facility]. Rene Kocher cannot find this place. He said he is going to change back to [facility]. Said he has 3 weeks of medication. York Spaniel he is out of Singulair. Asking this be sent to [name] until he can get his [facility] set up.  Also, pt wanted to let [name] know he stopped amlodipine due to dry throat/cough. The symptoms went away. But, he started having HR increasing so he went back on the amlodipine and the dry throat/cough came back.

## 2021-12-09 MED ORDER — MONTELUKAST SODIUM 10 MG PO TABS: ORAL_TABLET | ORAL | 0 refills | Status: DC

## 2021-12-09 NOTE — Telephone Encounter (Signed)
Noted.  Most of his prescriptions were originally sent to [facility]. From what I understand from this message, I am to send Singulair to [facility] in [place] for which I have done.  Otherwise it appears that nothing else was needed as his prescriptions were originally sent to central pharmacy [facility].  Have him check with the mail order pharmacy to make sure they have refills on file.   I am happy to meet with him regarding his blood pressure/heart rate.  He is also welcome to continue amlodipine.

## 2021-12-09 NOTE — Addendum Note (Signed)
Addended by: Pleas Koch on: 12/09/2021 04:30 PM   Modules accepted: Orders

## 2021-12-10 DIAGNOSIS — M2351 Chronic instability of knee, right knee: Secondary | ICD-10-CM | POA: Diagnosis not present

## 2021-12-10 DIAGNOSIS — M1711 Unilateral primary osteoarthritis, right knee: Secondary | ICD-10-CM | POA: Diagnosis not present

## 2021-12-12 NOTE — Telephone Encounter (Signed)
Called patient he has not been happy with insurance company. He will back with [employer] effective feb 1. He is going to call me with policy number so that I can call and verify that they have everything on filed needed. He has about 2-3 weeks of all other medications.   I have made follow up with patient about medications.

## 2021-12-16 ENCOUNTER — Other Ambulatory Visit: Payer: Self-pay

## 2021-12-16 ENCOUNTER — Encounter: Payer: Self-pay | Admitting: Primary Care

## 2021-12-16 ENCOUNTER — Ambulatory Visit (INDEPENDENT_AMBULATORY_CARE_PROVIDER_SITE_OTHER): Payer: Medicare HMO | Admitting: Primary Care

## 2021-12-16 VITALS — BP 126/64 | HR 75 | Temp 98.1°F | Ht 69.0 in | Wt 209.0 lb

## 2021-12-16 DIAGNOSIS — I152 Hypertension secondary to endocrine disorders: Secondary | ICD-10-CM

## 2021-12-16 DIAGNOSIS — R053 Chronic cough: Secondary | ICD-10-CM | POA: Diagnosis not present

## 2021-12-16 DIAGNOSIS — I10 Essential (primary) hypertension: Secondary | ICD-10-CM | POA: Diagnosis not present

## 2021-12-16 DIAGNOSIS — E1159 Type 2 diabetes mellitus with other circulatory complications: Secondary | ICD-10-CM | POA: Diagnosis not present

## 2021-12-16 NOTE — Assessment & Plan Note (Signed)
Resolved after discontinuation of amlodipine. Stop amlodipine permanently.

## 2021-12-16 NOTE — Progress Notes (Signed)
Subjective:    Patient ID: Brian Mullen, male    DOB: 06-Oct-1946, 76 y.o.   MRN: [id number]  HPI  Brian Mullen is a very pleasant 76 y.o. male with a history of hypertension, asthma, hyperlipidemia, type 2 diabetes, persistent cough who presents today to discuss blood pressure.  Currently prescribed amlodipine 5 mg, metoprolol succinate XL 25 mg daily. Historically this regimen has done well to control BP, however the amlodipine caused a cough. He tested this theory as he stopped the amlodipine and his cough abated. Unfortunately his BP elevated without amlodipine so he resumed and his cough returned.   He was off amlodipine for one month, BP at home was 120's-130's/70's. Since resuming   Today he would like to discontinue amlodipine and try something different. He was previously managed on lisinopril but this caused a dry/tickle to his throat with a cough. He was originally managed on losartan but this was discontinued due to palpitations.   He is taking metoprolol succinate 25 mg daily. Intermittent palpitations, but overall less frequent.   BP Readings from Last 3 Encounters:  12/16/21 126/64  09/30/21 124/62  09/02/21 122/62        Review of Systems  Constitutional:  Negative for chills and fever.  Respiratory:  Positive for cough. Negative for shortness of breath.   Cardiovascular:  Positive for palpitations. Negative for chest pain.        Past Medical History:  Diagnosis Date   Allergy    Asthma    Diabetes mellitus without complication (HCC)    GERD (gastroesophageal reflux disease)    Hypertension    Palpitations    a. 10/2019: Monitor showing rare PVC's and no significant arrhythmias.     Social History   Socioeconomic History   Marital status: Widowed    Spouse name: Not on file   Number of children: Not on file   Years of education: Not on file   Highest education level: Not on file  Occupational History   Not on file  Tobacco Use   Smoking  status: Former    Packs/day: 4.00    Years: 12.00    Pack years: 48.00    Types: Cigarettes    Quit date: 73    Years since quitting: 47.1   Smokeless tobacco: Never   Tobacco comments:    used to smoke Lucky's  Vaping Use   Vaping Use: Never used  Substance and Sexual Activity   Alcohol use: Yes    Alcohol/week: 1.0 - 2.0 standard drink    Types: 1 - 2 Standard drinks or equivalent per week    Comment: occ   Drug use: No   Sexual activity: Not on file  Other Topics Concern   Not on file  Social History Narrative   Married.   No children   Retried. Worked as a Furniture conservator/restorer.   Enjoys working on [employer], yard work, gardening.   Social Determinants of Health   Financial Resource Strain: Not on file  Food Insecurity: Not on file  Transportation Needs: Not on file  Physical Activity: Not on file  Stress: Not on file  Social Connections: Not on file  Intimate Partner Violence: Not on file    Past Surgical History:  Procedure Laterality Date   ELBOW SURGERY     KNEE SURGERY Right    TONSILLECTOMY      Family History  Problem Relation Age of Onset   Pulmonary embolism Father 65  Deceased   Heart attack Mother 85       Deceased   Diabetes Maternal Grandmother    Heart attack Sister     Allergies  Allergen Reactions   Coconut Oil Anaphylaxis   Iodine Anaphylaxis   Shellfish Allergy Anaphylaxis   Shellfish-Derived Products Anaphylaxis    Current Outpatient Medications on File Prior to Visit  Medication Sig Dispense Refill   ACCU-CHEK AVIVA PLUS test strip USE AS INSTRUCTED TO TEST BLOOD SUGAR 2 TIMES DAILY 200 strip 0   albuterol (VENTOLIN HFA) 108 (90 Base) MCG/ACT inhaler INHALE 1 TO 2 PUFFS EVERY 4 TO 6 HOURS AS NEEDED 1 each 0   Alcohol Swabs (B-D SINGLE USE SWABS REGULAR) PADS Use to check blood sugars. 200 each 3   amLODipine (NORVASC) 5 MG tablet TAKE 1 TABLET EVERY DAY FOR BLOOD PRESSURE 90 tablet 1   aspirin 81 MG tablet Take 81  mg by mouth daily.     Blood Glucose Monitoring Suppl (ONE TOUCH ULTRA 2) w/Device KIT Use as instructed to blood sugar 2 times daily. 1 each 0   fexofenadine (ALLEGRA) 180 MG tablet Take 180 mg by mouth daily.     fluticasone (FLONASE) 50 MCG/ACT nasal spray Place 1 spray into both nostrils 2 (two) times daily. 48 g 0   Lancets (ONETOUCH ULTRASOFT) lancets Use as instructed to test blood sugar 2 times daily. 100 each 5   latanoprost (XALATAN) 0.005 % ophthalmic solution SMARTSIG:1 Drop(s) In Eye(s) Every Evening     metFORMIN (GLUCOPHAGE) 500 MG tablet Take 2 tablets (1,000 mg total) by mouth 2 (two) times daily with a meal. For diabetes 360 tablet 3   metoprolol succinate (TOPROL-XL) 25 MG 24 hr tablet TAKE 1 TABLET DAILY FOR HIGH BLOOD PRESSURE 90 tablet 1   montelukast (SINGULAIR) 10 MG tablet TAKE 1 TABLET EVERY DAY AT BEDTIME FOR ALLERGIES / ASTHMA 90 tablet 0   omeprazole (PRILOSEC) 20 MG capsule Take 40 mg by mouth daily.     rosuvastatin (CRESTOR) 5 MG tablet TAKE 1 TABLET EVERY EVENING FOR CHOLESTEROL 90 tablet 1   SIMBRINZA 1-0.2 % SUSP Apply 1 drop to eye 2 (two) times daily.     tiotropium (SPIRIVA HANDIHALER) 18 MCG inhalation capsule Place 1 capsule (18 mcg total) into inhaler and inhale daily. 30 capsule 2   tiZANidine (ZANAFLEX) 4 MG tablet TAKE 1 TABLET (4 MG TOTAL) BY MOUTH AT BEDTIME AS NEEDED FOR MUSCLE SPASMS. 60 tablet 0   No current facility-administered medications on file prior to visit.    BP 126/64    Pulse 75    Temp 98.1 F (36.7 C) (Temporal)    Ht 5' 9"  (1.753 m)    Wt 209 lb (94.8 kg)    SpO2 97%    BMI 30.86 kg/m  Objective:   Physical Exam Cardiovascular:     Rate and Rhythm: Normal rate and regular rhythm.  Pulmonary:     Effort: Pulmonary effort is normal.     Breath sounds: Normal breath sounds. No wheezing or rales.  Musculoskeletal:     Cervical back: Neck supple.  Skin:    General: Skin is warm and dry.  Neurological:     Mental Status: He is  alert and oriented to person, place, and time.          Assessment & Plan:      This visit occurred during the SARS-CoV-2 public health emergency.  Safety protocols were in place, including screening questions  prior to the visit, additional usage of staff PPE, and extensive cleaning of exam room while observing appropriate contact time as indicated for disinfecting solutions.

## 2021-12-16 NOTE — Patient Instructions (Signed)
Stop amlodipine for blood pressure.  Monitor your blood pressure and notify me if you begin to see readings consistently at or above 140 on top or 90 on bottom.  It was a pleasure to see you today!

## 2021-12-16 NOTE — Assessment & Plan Note (Addendum)
Stop amlodipine for BP.  Interestingly, his BP was controlled off amlodipine without another anti-hypertensive agent.   Will have him monitor BP off amlodipine.  Consider starting olmesartan 10 mg daily if BP begins to rise to 140/90.  He verbalized understanding and will update.   Continue metoprolol succinate 25 mg daily.

## 2021-12-18 ENCOUNTER — Other Ambulatory Visit: Payer: Self-pay

## 2021-12-18 ENCOUNTER — Ambulatory Visit (HOSPITAL_COMMUNITY)
Admission: RE | Admit: 2021-12-18 | Discharge: 2021-12-18 | Disposition: A | Discharge status: Home / Self Care | Payer: Medicare HMO | Source: Ambulatory Visit | Attending: Primary Care | Admitting: Primary Care

## 2021-12-18 ENCOUNTER — Ambulatory Visit (HOSPITAL_COMMUNITY): Payer: Medicare HMO

## 2021-12-18 ENCOUNTER — Encounter (HOSPITAL_COMMUNITY): Payer: Self-pay

## 2021-12-18 DIAGNOSIS — R42 Dizziness and giddiness: Secondary | ICD-10-CM | POA: Insufficient documentation

## 2021-12-29 DIAGNOSIS — H401114 Primary open-angle glaucoma, right eye, indeterminate stage: Secondary | ICD-10-CM | POA: Diagnosis not present

## 2021-12-29 DIAGNOSIS — E119 Type 2 diabetes mellitus without complications: Secondary | ICD-10-CM | POA: Diagnosis not present

## 2021-12-29 DIAGNOSIS — H25811 Combined forms of age-related cataract, right eye: Secondary | ICD-10-CM | POA: Diagnosis not present

## 2021-12-29 DIAGNOSIS — H401134 Primary open-angle glaucoma, bilateral, indeterminate stage: Secondary | ICD-10-CM | POA: Diagnosis not present

## 2021-12-29 DIAGNOSIS — Z01818 Encounter for other preprocedural examination: Secondary | ICD-10-CM | POA: Diagnosis not present

## 2021-12-30 ENCOUNTER — Telehealth: Payer: Self-pay

## 2021-12-30 NOTE — Telephone Encounter (Signed)
[place] Night - Client Nonclinical Telephone Record  [facility] Client [facility] Night - Client Client Site [place] - Night Contact Type Call Who Is Calling Patient / Member / Family / Caregiver Caller Name [place] Phone Number [phone] Call Type Message Only Information Provided Reason for Call Returning a Call from the Office [place] states they are returning a call from the office. Additional Comment Office hours provided. Disp. Time Disposition Final User 12/29/2021 5:04:38 PM General Information Provided Yes Benetta Spar Call Closed By: Benetta Spar Transaction Date/Time: 12/29/2021 5:03:08 PM (ET

## 2021-12-31 ENCOUNTER — Telehealth: Payer: Self-pay | Admitting: Primary Care

## 2021-12-31 DIAGNOSIS — I10 Essential (primary) hypertension: Secondary | ICD-10-CM

## 2021-12-31 MED ORDER — METOPROLOL SUCCINATE ER 25 MG PO TB24: 25.0000 mg | ORAL_TABLET | Freq: Every day | ORAL | 0 refills | Status: DC

## 2021-12-31 MED ORDER — METOPROLOL SUCCINATE ER 25 MG PO TB24: 25.0000 mg | ORAL_TABLET | Freq: Every day | ORAL | 3 refills | Status: DC

## 2021-12-31 NOTE — Addendum Note (Signed)
Addended by: Doreene Nest on: 12/31/2021 02:02 PM   Modules accepted: Orders

## 2021-12-31 NOTE — Telephone Encounter (Signed)
Noted, metoprolol was sent sent to the respective pharmacies as requested.

## 2021-12-31 NOTE — Telephone Encounter (Signed)
[facility] call the needs a 90day supply sent to them and a 10 fill to [facility]oprolol succinate (TOPROL-XL) 25 MG 24 hr tablet

## 2022-01-01 NOTE — Telephone Encounter (Signed)
I do not see anything in chart where patient was called.

## 2022-01-07 ENCOUNTER — Telehealth: Payer: Self-pay | Admitting: Primary Care

## 2022-01-07 DIAGNOSIS — M25562 Pain in left knee: Secondary | ICD-10-CM | POA: Diagnosis not present

## 2022-01-07 DIAGNOSIS — M25561 Pain in right knee: Secondary | ICD-10-CM | POA: Diagnosis not present

## 2022-01-07 NOTE — Telephone Encounter (Signed)
Pt came in office requesting to get his healthcare Power of Attorney form be sign by PCP . Form was place in folder

## 2022-01-07 NOTE — Telephone Encounter (Signed)
Placed in folder

## 2022-01-12 DIAGNOSIS — R2689 Other abnormalities of gait and mobility: Secondary | ICD-10-CM | POA: Diagnosis not present

## 2022-01-12 DIAGNOSIS — R42 Dizziness and giddiness: Secondary | ICD-10-CM | POA: Diagnosis not present

## 2022-01-26 ENCOUNTER — Other Ambulatory Visit: Payer: Self-pay | Admitting: Primary Care

## 2022-01-26 DIAGNOSIS — J4540 Moderate persistent asthma, uncomplicated: Secondary | ICD-10-CM

## 2022-02-12 NOTE — Progress Notes (Signed)
?Cardiology Office Note:   ? ?Date:  02/13/2022  ? ?ID:  Brian Mullen, DOB 1946/03/09, MRN [id number] ? ?PCP:  Pleas Koch, NP ?  ?[facility] Providers ?Cardiologist:  Werner Lean, MD    ? ?Referring MD: Pleas Koch, NP  ? ?CC: LE edema ? ?History of Present Illness:   ? ?Brian Mullen is a 76 y.o. male with a hx of HLD with DM, Mild intermittent asthma, and occasional PVCs.  Seen 02/28/21 with palpitations and associated chest pain.  Has transition from losartan to lisinopril with improvement of symptoms.  ? ?Patient notes that he is doing great.   ?No further PVCs save for one episode that improved with 1/2 pill of succinate. ?Has stopped the norvasc with no further swelling ?There are no interval hospital/ED visit.   ? ?No chest pain or pressure .  No SOB/DOE and no PND/Orthopnea.  No weight gain or leg swelling.  No syncope ?Notes some easy bruising ? ? ?Past Medical History:  ?Diagnosis Date  ? Allergy   ? Asthma   ? Diabetes mellitus without complication ([place])   ? GERD (gastroesophageal reflux disease)   ? Hypertension   ? Palpitations   ? a. 10/2019: Monitor showing rare PVC's and no significant arrhythmias.   ? ? ?Past Surgical History:  ?Procedure Laterality Date  ? ELBOW SURGERY    ? KNEE SURGERY Right   ? TONSILLECTOMY    ? ? ?Current Medications: ?Current Meds  ?Medication Sig  ? ACCU-CHEK AVIVA PLUS test strip USE AS INSTRUCTED TO TEST BLOOD SUGAR 2 TIMES DAILY  ? albuterol (VENTOLIN HFA) 108 (90 Base) MCG/ACT inhaler INHALE 1 TO 2 PUFFS EVERY 4 TO 6 HOURS AS NEEDED  ? Alcohol Swabs (B-D SINGLE USE SWABS REGULAR) PADS Use to check blood sugars.  ? aspirin 81 MG tablet Take 81 mg by mouth daily.  ? Blood Glucose Monitoring Suppl (ONE TOUCH ULTRA 2) w/Device KIT Use as instructed to blood sugar 2 times daily.  ? dorzolamide-timolol (COSOPT) 22.3-6.8 MG/ML ophthalmic solution Place 1 drop into the left eye 2 (two) times daily.  ? fexofenadine (ALLEGRA) 180 MG tablet Take 180 mg by  mouth daily.  ? fluticasone (FLONASE) 50 MCG/ACT nasal spray Place 1 spray into both nostrils 2 (two) times daily.  ? Lancets (ONETOUCH ULTRASOFT) lancets Use as instructed to test blood sugar 2 times daily.  ? latanoprost (XALATAN) 0.005 % ophthalmic solution SMARTSIG:1 Drop(s) In Eye(s) Every Evening  ? metFORMIN (GLUCOPHAGE) 500 MG tablet Take 2 tablets (1,000 mg total) by mouth 2 (two) times daily with a meal. For diabetes  ? metoprolol succinate (TOPROL-XL) 25 MG 24 hr tablet Take 1 tablet (25 mg total) by mouth daily. for high blood pressure  ? montelukast (SINGULAIR) 10 MG tablet TAKE 1 TABLET EVERY DAY AT BEDTIME FOR ALLERGIES / ASTHMA  ? omeprazole (PRILOSEC) 20 MG capsule Take 40 mg by mouth daily.  ? prednisoLONE acetate (PRED FORTE) 1 % ophthalmic suspension Place 1 drop into both eyes.  ? rosuvastatin (CRESTOR) 5 MG tablet TAKE 1 TABLET EVERY EVENING FOR CHOLESTEROL  ? SIMBRINZA 1-0.2 % SUSP Apply 1 drop to eye 2 (two) times daily.  ? tiotropium (SPIRIVA HANDIHALER) 18 MCG inhalation capsule Place 1 capsule (18 mcg total) into inhaler and inhale daily.  ? tiZANidine (ZANAFLEX) 4 MG tablet TAKE 1 TABLET (4 MG TOTAL) BY MOUTH AT BEDTIME AS NEEDED FOR MUSCLE SPASMS.  ?  ? ?Allergies:   Coconut oil, Iodine,  Shellfish allergy, Shellfish-derived products, and Amlodipine  ? ?Social History  ? ?Socioeconomic History  ? Marital status: Widowed  ?  Spouse name: Not on file  ? Number of children: Not on file  ? Years of education: Not on file  ? Highest education level: Not on file  ?Occupational History  ? Not on file  ?Tobacco Use  ? Smoking status: Former  ?  Packs/day: 4.00  ?  Years: 12.00  ?  Pack years: 48.00  ?  Types: Cigarettes  ?  Quit date: 25  ?  Years since quitting: 18.2  ? Smokeless tobacco: Never  ? Tobacco comments:  ?  used to smoke [employer]  ?Vaping Use  ? Vaping Use: Never used  ?Substance and Sexual Activity  ? Alcohol use: Yes  ?  Alcohol/week: 1.0 - 2.0 standard drink  ?  Types: 1 - 2  Standard drinks or equivalent per week  ?  Comment: occ  ? Drug use: No  ? Sexual activity: Not on file  ?Other Topics Concern  ? Not on file  ?Social History Narrative  ? Married.  ? No children  ? Retried. Worked as a Furniture conservator/restorer.  ? Enjoys working on [employer], yard work, gardening.  ? ?Social Determinants of Health  ? ?Financial Resource Strain: Not on file  ?Food Insecurity: Not on file  ?Transportation Needs: Not on file  ?Physical Activity: Not on file  ?Stress: Not on file  ?Social Connections: Not on file  ?  ?Family History: ?The patient's family history includes Diabetes in his maternal grandmother; Heart attack in his sister; Heart attack (age of onset: 91) in his mother; Pulmonary embolism (age of onset: 47) in his father. ? ?ROS:   ?Please see the history of present illness.    ? All other systems reviewed and are negative. ? ?EKGs/Labs/Other Studies Reviewed:   ? ?The following studies were reviewed today: ? ?EKG:  ?09/02/21: SR 71 WNL no PVCs ? ?Cardiac Event Monitoring: ?Date: 09/29/2019 ?Results: ?Isolated PVCs ? ?Transthoracic Echocardiogram: ?Date: 10/31/2019 ?Results: ? 1. Left ventricular ejection fraction, by visual estimation, is 55 to  ?60%. The left ventricle has normal function. There is no left ventricular  ?hypertrophy.  ? 2. The left ventricle has no regional wall motion abnormalities.  ? 3. Global right ventricle has normal systolic function.The right  ?ventricular size is normal. No increase in right ventricular wall  ?thickness.  ? 4. Left atrial size was normal.  ? 5. Right atrial size was normal.  ? 6. Presence of pericardial fat pad.  ? 7. The mitral valve is grossly normal. No evidence of mitral valve  ?regurgitation.  ? 8. The tricuspid valve is grossly normal. Tricuspid valve regurgitation  ?is trivial.  ? 9. The aortic valve is tricuspid. Aortic valve regurgitation is not  ?visualized.  ?10. The pulmonic valve was not well visualized. Pulmonic valve   ?regurgitation is not visualized.  ?11. TR signal is inadequate for assessing pulmonary artery systolic  ?pressure.  ?12. The inferior vena cava is normal in size with <50% respiratory  ?variability, suggesting right atrial pressure of 8 mmHg.  ? ? ? ?Recent Labs: ?02/26/2021: BUN 10; Creatinine, Ser 0.89; Hemoglobin 13.7; Platelets 240; Potassium 4.2; Sodium 135 ?03/28/2021: ALT 11  ?Recent Lipid Panel ?   ?Component Value Date/Time  ? CHOL 124 03/28/2021 1205  ? TRIG 102.0 03/28/2021 1205  ? HDL 44.50 03/28/2021 1205  ? CHOLHDL 3 03/28/2021 1205  ?  VLDL 20.4 03/28/2021 1205  ? LDLCALC 59 03/28/2021 1205  ? LDLCALC 51 02/11/2018 1532  ? LDLDIRECT 151.6 09/22/2013 0915  ? ?Physical Exam:   ? ?VS:  BP (!) 110/58   Pulse 68   Ht 5' 9"  (1.753 m)   Wt 205 lb (93 kg)   SpO2 97%   BMI 30.27 kg/m?    ? ?Wt Readings from Last 3 Encounters:  ?02/13/22 205 lb (93 kg)  ?12/16/21 209 lb (94.8 kg)  ?09/30/21 204 lb (92.5 kg)  ?  ?GEN:  Well nourished, well developed elderly male in no acute distress ?HEENT: R [place]'s Sign  ?NECK: No JVD ?CARDIAC: RRR, no murmurs, rubs, gallops ?RESPIRATORY:  Clear to auscultation without rales, wheezing or rhonchi  ?ABDOMEN: Soft, non-tender, non-distended ?MUSCULOSKELETAL: no edema  No deformity  ?SKIN: Warm and dry ?NEUROLOGIC:  Alert and oriented x 3 ?PSYCHIATRIC:  Normal affect  ? ?ASSESSMENT:   ? ?No diagnosis found. ? ?PLAN:   ? ?PVCs ?- resolved on succinate 25 mg PO daily ?HTN with DM ?HLD with DM  ?-  with LDL of 59 on rosuvastatin 5 mg PO daily  ?- compression stockings PRN ?- amlodipine related LE swelling that has now resolved ?- If his rare walking up the hill related DOE gets worse (he notes it more during allergy season) we have a low threshold for CCTA ?- presently has easy bruising, is over the age of 75, and ASA is primary prevention; we will do a trial of aspirin  ? ?One year follow up ? ? ?Medication Adjustments/Labs and Tests Ordered: ?Current medicines are reviewed at  length with the patient today.  Concerns regarding medicines are outlined above.  ?No orders of the defined types were placed in this encounter. ? ?No orders of the defined types were placed in this encounter. ? ? ? ?There

## 2022-02-13 ENCOUNTER — Encounter: Payer: Self-pay | Admitting: Internal Medicine

## 2022-02-13 ENCOUNTER — Ambulatory Visit: Payer: Medicare HMO | Admitting: Internal Medicine

## 2022-02-13 VITALS — BP 110/58 | HR 68 | Ht 69.0 in | Wt 205.0 lb

## 2022-02-13 DIAGNOSIS — E1169 Type 2 diabetes mellitus with other specified complication: Secondary | ICD-10-CM | POA: Diagnosis not present

## 2022-02-13 DIAGNOSIS — I152 Hypertension secondary to endocrine disorders: Secondary | ICD-10-CM

## 2022-02-13 DIAGNOSIS — E1159 Type 2 diabetes mellitus with other circulatory complications: Secondary | ICD-10-CM | POA: Diagnosis not present

## 2022-02-13 DIAGNOSIS — I493 Ventricular premature depolarization: Secondary | ICD-10-CM | POA: Diagnosis not present

## 2022-02-13 DIAGNOSIS — E785 Hyperlipidemia, unspecified: Secondary | ICD-10-CM

## 2022-02-13 NOTE — Patient Instructions (Signed)
Medication Instructions:  ?Stop Aspirin  ? ?Follow-Up: ?Follow up in 1 year with Dr. Izora Ribas ? ?Any Other Special Instructions Will Be Listed Below (If Applicable). ? ? ? ? ?If you need a refill on your cardiac medications before your next appointment, please call your pharmacy. ? ?

## 2022-02-23 DIAGNOSIS — H401124 Primary open-angle glaucoma, left eye, indeterminate stage: Secondary | ICD-10-CM | POA: Diagnosis not present

## 2022-02-23 DIAGNOSIS — H25812 Combined forms of age-related cataract, left eye: Secondary | ICD-10-CM | POA: Diagnosis not present

## 2022-02-23 DIAGNOSIS — H401134 Primary open-angle glaucoma, bilateral, indeterminate stage: Secondary | ICD-10-CM | POA: Diagnosis not present

## 2022-03-20 ENCOUNTER — Other Ambulatory Visit: Payer: Self-pay | Admitting: Primary Care

## 2022-03-20 DIAGNOSIS — R252 Cramp and spasm: Secondary | ICD-10-CM

## 2022-03-20 DIAGNOSIS — G8929 Other chronic pain: Secondary | ICD-10-CM

## 2022-03-21 ENCOUNTER — Other Ambulatory Visit: Payer: Self-pay | Admitting: Primary Care

## 2022-03-21 DIAGNOSIS — E7800 Pure hypercholesterolemia, unspecified: Secondary | ICD-10-CM

## 2022-03-31 ENCOUNTER — Other Ambulatory Visit: Payer: Self-pay | Admitting: Primary Care

## 2022-03-31 ENCOUNTER — Encounter: Payer: Self-pay | Admitting: Primary Care

## 2022-03-31 ENCOUNTER — Ambulatory Visit (INDEPENDENT_AMBULATORY_CARE_PROVIDER_SITE_OTHER): Payer: Medicare HMO | Admitting: Primary Care

## 2022-03-31 VITALS — BP 112/70 | HR 68 | Temp 97.6°F | Ht 69.0 in | Wt 204.0 lb

## 2022-03-31 DIAGNOSIS — E1169 Type 2 diabetes mellitus with other specified complication: Secondary | ICD-10-CM | POA: Diagnosis not present

## 2022-03-31 DIAGNOSIS — E1165 Type 2 diabetes mellitus with hyperglycemia: Secondary | ICD-10-CM | POA: Diagnosis not present

## 2022-03-31 DIAGNOSIS — E785 Hyperlipidemia, unspecified: Secondary | ICD-10-CM | POA: Diagnosis not present

## 2022-03-31 DIAGNOSIS — Z125 Encounter for screening for malignant neoplasm of prostate: Secondary | ICD-10-CM | POA: Diagnosis not present

## 2022-03-31 DIAGNOSIS — I10 Essential (primary) hypertension: Secondary | ICD-10-CM | POA: Diagnosis not present

## 2022-03-31 DIAGNOSIS — F5104 Psychophysiologic insomnia: Secondary | ICD-10-CM

## 2022-03-31 DIAGNOSIS — J4540 Moderate persistent asthma, uncomplicated: Secondary | ICD-10-CM | POA: Diagnosis not present

## 2022-03-31 DIAGNOSIS — R3915 Urgency of urination: Secondary | ICD-10-CM

## 2022-03-31 DIAGNOSIS — Z0000 Encounter for general adult medical examination without abnormal findings: Secondary | ICD-10-CM | POA: Diagnosis not present

## 2022-03-31 DIAGNOSIS — M546 Pain in thoracic spine: Secondary | ICD-10-CM

## 2022-03-31 DIAGNOSIS — I152 Hypertension secondary to endocrine disorders: Secondary | ICD-10-CM

## 2022-03-31 DIAGNOSIS — E1159 Type 2 diabetes mellitus with other circulatory complications: Secondary | ICD-10-CM

## 2022-03-31 DIAGNOSIS — M5416 Radiculopathy, lumbar region: Secondary | ICD-10-CM

## 2022-03-31 DIAGNOSIS — Z0100 Encounter for examination of eyes and vision without abnormal findings: Secondary | ICD-10-CM | POA: Diagnosis not present

## 2022-03-31 DIAGNOSIS — G8929 Other chronic pain: Secondary | ICD-10-CM

## 2022-03-31 DIAGNOSIS — Z1211 Encounter for screening for malignant neoplasm of colon: Secondary | ICD-10-CM

## 2022-03-31 LAB — COMPREHENSIVE METABOLIC PANEL
ALT: 14 U/L (ref 0–53)
AST: 13 U/L (ref 0–37)
Albumin: 4.2 g/dL (ref 3.5–5.2)
Alkaline Phosphatase: 47 U/L (ref 39–117)
BUN: 12 mg/dL (ref 6–23)
CO2: 27 mEq/L (ref 19–32)
Calcium: 9 mg/dL (ref 8.4–10.5)
Chloride: 99 mEq/L (ref 96–112)
Creatinine, Ser: 0.94 mg/dL (ref 0.40–1.50)
GFR: 78.88 mL/min (ref >60.00)
Glucose, Bld: 134 mg/dL — ABNORMAL HIGH (ref 70–99)
Potassium: 4.6 mEq/L (ref 3.5–5.1)
Sodium: 134 mEq/L — ABNORMAL LOW (ref 135–145)
Total Bilirubin: 0.3 mg/dL (ref 0.2–1.2)
Total Protein: 6.3 g/dL (ref 6.0–8.3)

## 2022-03-31 LAB — CBC
HCT: 37 % — ABNORMAL LOW (ref 39.0–52.0)
Hemoglobin: 12.1 g/dL — ABNORMAL LOW (ref 13.0–17.0)
MCHC: 32.8 g/dL (ref 30.0–36.0)
MCV: 90.5 fl (ref 78.0–100.0)
Platelets: 319 10*3/uL (ref 150.0–400.0)
RBC: 4.09 Mil/uL — ABNORMAL LOW (ref 4.22–5.81)
RDW: 14.3 % (ref 11.5–15.5)
WBC: 9.3 10*3/uL (ref 4.0–10.5)

## 2022-03-31 LAB — LIPID PANEL
Cholesterol: 120 mg/dL (ref 0–200)
HDL: 41 mg/dL (ref >39.00)
LDL Cholesterol: 44 mg/dL (ref 0–99)
NonHDL: 79.28
Total CHOL/HDL Ratio: 3
Triglycerides: 176 mg/dL — ABNORMAL HIGH (ref 0.0–149.0)
VLDL: 35.2 mg/dL (ref 0.0–40.0)

## 2022-03-31 LAB — PSA, MEDICARE: PSA: 1.7 ng/ml (ref 0.10–4.00)

## 2022-03-31 LAB — HEMOGLOBIN A1C: Hgb A1c MFr Bld: 7.6 % — ABNORMAL HIGH (ref 4.6–6.5)

## 2022-03-31 MED ORDER — FREESTYLE LIBRE 2 SENSOR MISC: 1 refills | Status: DC

## 2022-03-31 MED ORDER — TAMSULOSIN HCL 0.4 MG PO CAPS: 0.4000 mg | ORAL_CAPSULE | Freq: Every day | ORAL | 0 refills | Status: DC

## 2022-03-31 NOTE — Assessment & Plan Note (Signed)
Patient not interested in Shingrix vaccines.  Other vaccines up-to-date. ?Patient requesting to repeat Cologuard, I did encourage him to contact his insurance company to ensure they will cover as he is 76 years old. ? ?PSA due and pending ? ?Discussed the importance of a healthy diet and regular exercise in order for weight loss, and to reduce the risk of further co-morbidity. ? ?Exam today as noted. ?Labs pending ?

## 2022-03-31 NOTE — Assessment & Plan Note (Signed)
Controlled.  Continue metoprolol succinate 25 mg daily. 

## 2022-03-31 NOTE — Assessment & Plan Note (Signed)
Overall controlled. ?Continue tizanidine 4 mg as needed ?

## 2022-03-31 NOTE — Patient Instructions (Addendum)
Please contact [employer] to ask if they will cover Cologuard. ? ?Stop by the lab prior to leaving today. I will notify you of your results once received.  ? ?Start tamsulosin (Flomax) 0.4 mg.  Take 1 tablet by mouth at bedtime for urine flow/urgency.  Please update me in a few weeks. ? ?It was a pleasure to see you today! ? ?Preventive Care 54 Years and Older, Male ?Preventive care refers to lifestyle choices and visits with your health care provider that can promote health and wellness. Preventive care visits are also called wellness exams. ?What can I expect for my preventive care visit? ?Counseling ?During your preventive care visit, your health care provider may ask about your: ?Medical history, including: ?Past medical problems. ?Family medical history. ?History of falls. ?Current health, including: ?Emotional well-being. ?Home life and relationship well-being. ?Sexual activity. ?Memory and ability to understand (cognition). ?Lifestyle, including: ?Alcohol, nicotine or tobacco, and drug use. ?Access to firearms. ?Diet, exercise, and sleep habits. ?Work and work Astronomer. ?Sunscreen use. ?Safety issues such as seatbelt and bike helmet use. ?Physical exam ?Your health care provider will check your: ?Height and weight. These may be used to calculate your BMI (body mass index). BMI is a measurement that tells if you are at a healthy weight. ?Waist circumference. This measures the distance around your waistline. This measurement also tells if you are at a healthy weight and may help predict your risk of certain diseases, such as type 2 diabetes and high blood pressure. ?Heart rate and blood pressure. ?Body temperature. ?Skin for abnormal spots. ?What immunizations do I need? ? ?Vaccines are usually given at various ages, according to a schedule. Your health care provider will recommend vaccines for you based on your age, medical history, and lifestyle or other factors, such as travel or where you work. ?What tests  do I need? ?Screening ?Your health care provider may recommend screening tests for certain conditions. This may include: ?Lipid and cholesterol levels. ?Diabetes screening. This is done by checking your blood sugar (glucose) after you have not eaten for a while (fasting). ?Hepatitis C test. ?Hepatitis B test. ?HIV (human immunodeficiency virus) test. ?STI (sexually transmitted infection) testing, if you are at risk. ?Lung cancer screening. ?Colorectal cancer screening. ?Prostate cancer screening. ?Abdominal aortic aneurysm (AAA) screening. You may need this if you are a current or former smoker. ?Talk with your health care provider about your test results, treatment options, and if necessary, the need for more tests. ?Follow these instructions at home: ?Eating and drinking ? ?Eat a diet that includes fresh fruits and vegetables, whole grains, lean protein, and low-fat dairy products. Limit your intake of foods with high amounts of sugar, saturated fats, and salt. ?Take vitamin and mineral supplements as recommended by your health care provider. ?Do not drink alcohol if your health care provider tells you not to drink. ?If you drink alcohol: ?Limit how much you have to 0-2 drinks a day. ?Know how much alcohol is in your drink. In the [place] one drink equals one 12 oz bottle of beer (355 mL), one 5 oz glass of wine (148 mL), or one 1? oz glass of hard liquor (44 mL). ?Lifestyle ?Brush your teeth every morning and night with fluoride toothpaste. Floss one time each day. ?Exercise for at least 30 minutes 5 or more days each week. ?Do not use any products that contain nicotine or tobacco. These products include cigarettes, chewing tobacco, and vaping devices, such as e-cigarettes. If you need help quitting, ask  your health care provider. ?Do not use drugs. ?If you are sexually active, practice safe sex. Use a condom or other form of protection to prevent STIs. ?Take aspirin only as told by your health care provider. Make  sure that you understand how much to take and what form to take. Work with your health care provider to find out whether it is safe and beneficial for you to take aspirin daily. ?Ask your health care provider if you need to take a cholesterol-lowering medicine (statin). ?Find healthy ways to manage stress, such as: ?Meditation, yoga, or listening to music. ?Journaling. ?Talking to a trusted person. ?Spending time with friends and family. ?Safety ?Always wear your seat belt while driving or riding in a vehicle. ?Do not drive: ?If you have been drinking alcohol. Do not ride with someone who has been drinking. ?When you are tired or distracted. ?While texting. ?If you have been using any mind-altering substances or drugs. ?Wear a helmet and other protective equipment during sports activities. ?If you have firearms in your house, make sure you follow all gun safety procedures. ?Minimize exposure to UV radiation to reduce your risk of skin cancer. ?What's next? ?Visit your health care provider once a year for an annual wellness visit. ?Ask your health care provider how often you should have your eyes and teeth checked. ?Stay up to date on all vaccines. ?This information is not intended to replace advice given to you by your health care provider. Make sure you discuss any questions you have with your health care provider. ?Document Revised: 04/30/2021 Document Reviewed: 04/30/2021 ?Elsevier Patient Education ? 2023 [employer] ? ?

## 2022-03-31 NOTE — Assessment & Plan Note (Signed)
Symptoms representative of BPH with LUTS. ? ?He agrees for treatment. ? ?Start tamsulosin 0.4 mg daily.  He will update in a few weeks ?

## 2022-03-31 NOTE — Assessment & Plan Note (Addendum)
Repeat A1C pending. ? ?Continue metformin XR 1000 mg BID. ?Prescription for freestyle libre 2 sensors sent to pharmacy.  Patient has smart phone. ? ?Managed on statin. ?Urine microalbumin UTD. ?Eye exam up-to-date. ?Foot exam up-to-date. ? ?Follow up in 3-6 months. ? ?

## 2022-03-31 NOTE — Progress Notes (Signed)
? ?Subjective:  ? ? Patient ID: Brian Mullen, male    DOB: [birth year], 76 y.o.   MRN: [id number] ? ?HPI ? ?Brian Mullen is a very pleasant 76 y.o. male who presents today for complete physical and follow up of chronic conditions. ? ?They are requesting a continuous glucose monitor. He does not check glucose readings often but would like to start now. He does not like pricking his finger. He has a Stage manager. ? ?He continues with sleep disturbance. He gets about 2 hours of sleep each night as he "just wakes up". He sometimes has difficulty falling asleep. He sometimes has to urinate on occasion. He was evaluated by pulmonology in May 2023 and symptoms were suspected to be secondary to sleep fragmentation and frequent nocturnal awakenings and insomnia of sleep maintenance. It was determined that he did not have symptoms of OSA, sleep study was not completed. He does not drink caffeine. He falls asleep at 7 pm each night.  ? ?He would also like to discuss urinary urgency with weak urinary stream. Chronic for years, gradually worse. He often doesn't feel as though he can urinate completely. He's never been diagnosed with BPH.  ? ?Immunizations: ?-Tetanus: 2014 ?-Influenza: Completed last season ?-Covid-19: Has not completed ?-Shingles: Completed Zostavax in 2018 ?-Pneumonia: Prevnar 13 in 2014, Pneumovax 23 in 2020 ? ?Diet: Fair diet.  ?Exercise: Active in the yard, built a deck, putting stone down his side walk.  ? ?Eye exam: Completes annually  ?Dental exam: Completed years ago.  ? ?Colonoscopy: Completed Cologuard in 2019. He would like to repeat Cologuard. ?PSA: Due ? ?BP Readings from Last 3 Encounters:  ?03/31/22 112/70  ?02/13/22 (!) 110/58  ?12/16/21 126/64  ? ? ? ? ? ? ?Review of Systems  ?Constitutional:  Negative for unexpected weight change.  ?HENT:  Negative for rhinorrhea.   ?Respiratory:  Positive for chest tightness and shortness of breath. Negative for cough.   ?Cardiovascular:  Negative for chest pain.   ?Gastrointestinal:  Negative for constipation and diarrhea.  ?Genitourinary:  Positive for difficulty urinating and urgency.  ?Musculoskeletal:  Negative for arthralgias and myalgias.  ?Skin:  Negative for rash.  ?Allergic/Immunologic: Negative for environmental allergies.  ?Neurological:  Negative for dizziness and headaches.  ?Psychiatric/Behavioral:  Positive for sleep disturbance. The patient is not nervous/anxious.   ? ?   ? ? ?Past Medical History:  ?Diagnosis Date  ? Allergy   ? Asthma   ? Diabetes mellitus without complication ([place])   ? GERD (gastroesophageal reflux disease)   ? Hypertension   ? Palpitations   ? a. 10/2019: Monitor showing rare PVC's and no significant arrhythmias.   ? ? ?Social History  ? ?Socioeconomic History  ? Marital status: Widowed  ?  Spouse name: Not on file  ? Number of children: Not on file  ? Years of education: Not on file  ? Highest education level: Not on file  ?Occupational History  ? Not on file  ?Tobacco Use  ? Smoking status: Former  ?  Packs/day: 4.00  ?  Years: 12.00  ?  Pack years: 48.00  ?  Types: Cigarettes  ?  Quit date: 32  ?  Years since quitting: 47.4  ? Smokeless tobacco: Never  ? Tobacco comments:  ?  used to smoke Lucky's  ?Vaping Use  ? Vaping Use: Never used  ?Substance and Sexual Activity  ? Alcohol use: Yes  ?  Alcohol/week: 1.0 - 2.0 standard drink  ?  Types: 1 -  2 Standard drinks or equivalent per week  ?  Comment: occ  ? Drug use: No  ? Sexual activity: Not on file  ?Other Topics Concern  ? Not on file  ?Social History Narrative  ? Married.  ? No children  ? Retried. Worked as a Furniture conservator/restorer.  ? Enjoys working on [employer], yard work, gardening.  ? ?Social Determinants of Health  ? ?Financial Resource Strain: Not on file  ?Food Insecurity: Not on file  ?Transportation Needs: Not on file  ?Physical Activity: Not on file  ?Stress: Not on file  ?Social Connections: Not on file  ?Intimate Partner Violence: Not on file  ? ? ?Past Surgical  History:  ?Procedure Laterality Date  ? ELBOW SURGERY    ? KNEE SURGERY Right   ? TONSILLECTOMY    ? ? ?Family History  ?Problem Relation Age of Onset  ? Pulmonary embolism Father 66  ?     Deceased  ? Heart attack Mother 63  ?     Deceased  ? Diabetes Maternal Grandmother   ? Heart attack Sister   ? ? ?Allergies  ?Allergen Reactions  ? Coconut Oil Anaphylaxis  ? Iodine Anaphylaxis  ? Shellfish Allergy Anaphylaxis  ? Shellfish-Derived Products Anaphylaxis  ? Amlodipine Cough  ?  LE Swelling  ? ? ?Current Outpatient Medications on File Prior to Visit  ?Medication Sig Dispense Refill  ? ACCU-CHEK AVIVA PLUS test strip USE AS INSTRUCTED TO TEST BLOOD SUGAR 2 TIMES DAILY 200 strip 0  ? albuterol (VENTOLIN HFA) 108 (90 Base) MCG/ACT inhaler INHALE 1 TO 2 PUFFS EVERY 4 TO 6 HOURS AS NEEDED 1 each 0  ? Alcohol Swabs (B-D SINGLE USE SWABS REGULAR) PADS Use to check blood sugars. 200 each 3  ? Blood Glucose Monitoring Suppl (ONE TOUCH ULTRA 2) w/Device KIT Use as instructed to blood sugar 2 times daily. 1 each 0  ? dorzolamide-timolol (COSOPT) 22.3-6.8 MG/ML ophthalmic solution Place 1 drop into the left eye 2 (two) times daily.    ? fexofenadine (ALLEGRA) 180 MG tablet Take 180 mg by mouth daily.    ? fluticasone (FLONASE) 50 MCG/ACT nasal spray Place 1 spray into both nostrils 2 (two) times daily. 48 g 0  ? Lancets (ONETOUCH ULTRASOFT) lancets Use as instructed to test blood sugar 2 times daily. 100 each 5  ? latanoprost (XALATAN) 0.005 % ophthalmic solution SMARTSIG:1 Drop(s) In Eye(s) Every Evening    ? metFORMIN (GLUCOPHAGE) 500 MG tablet Take 2 tablets (1,000 mg total) by mouth 2 (two) times daily with a meal. For diabetes 360 tablet 3  ? metoprolol succinate (TOPROL-XL) 25 MG 24 hr tablet Take 1 tablet (25 mg total) by mouth daily. for high blood pressure 90 tablet 3  ? montelukast (SINGULAIR) 10 MG tablet TAKE 1 TABLET EVERY DAY AT BEDTIME FOR ALLERGIES / ASTHMA 90 tablet 0  ? omeprazole (PRILOSEC) 20 MG capsule Take  40 mg by mouth daily.    ? prednisoLONE acetate (PRED FORTE) 1 % ophthalmic suspension Place 1 drop into both eyes.    ? rosuvastatin (CRESTOR) 5 MG tablet TAKE 1 TABLET EVERY EVENING FOR CHOLESTEROL 90 tablet 0  ? SIMBRINZA 1-0.2 % SUSP Apply 1 drop to eye 2 (two) times daily.    ? tiotropium (SPIRIVA HANDIHALER) 18 MCG inhalation capsule Place 1 capsule (18 mcg total) into inhaler and inhale daily. 30 capsule 2  ? tiZANidine (ZANAFLEX) 4 MG tablet TAKE 1 TABLET AT BEDTIME AS NEEDED  FOR MUSCLE SPASM(S) 30 tablet 0  ? ketorolac (ACULAR) 0.5 % ophthalmic solution     ? ?No current facility-administered medications on file prior to visit.  ? ? ?BP 112/70   Pulse 68   Temp 97.6 ?F (36.4 ?C) (Oral)   Ht 5' 9"  (1.753 m)   Wt 204 lb (92.5 kg)   SpO2 99%   BMI 30.13 kg/m?  ?Objective:  ? Physical Exam ?HENT:  ?   Right Ear: Tympanic membrane and ear canal normal.  ?   Left Ear: Tympanic membrane and ear canal normal.  ?   Nose: Nose normal.  ?   Right Sinus: No maxillary sinus tenderness or frontal sinus tenderness.  ?   Left Sinus: No maxillary sinus tenderness or frontal sinus tenderness.  ?Eyes:  ?   Conjunctiva/sclera: Conjunctivae normal.  ?Neck:  ?   Thyroid: No thyromegaly.  ?   Vascular: No carotid bruit.  ?Cardiovascular:  ?   Rate and Rhythm: Normal rate and regular rhythm.  ?   Heart sounds: Normal heart sounds.  ?Pulmonary:  ?   Effort: Pulmonary effort is normal.  ?   Breath sounds: Normal breath sounds. No wheezing or rales.  ?Abdominal:  ?   General: Bowel sounds are normal.  ?   Palpations: Abdomen is soft.  ?   Tenderness: There is no abdominal tenderness.  ?Musculoskeletal:     ?   General: Normal range of motion.  ?   Cervical back: Neck supple.  ?Skin: ?   General: Skin is warm and dry.  ?Neurological:  ?   Mental Status: He is alert and oriented to person, place, and time.  ?   Cranial Nerves: No cranial nerve deficit.  ?   Deep Tendon Reflexes: Reflexes are normal and symmetric.  ?Psychiatric:      ?   Mood and Affect: Mood normal.  ? ? ? ? ? ?   ?Assessment & Plan:  ? ? ? ? ?This visit occurred during the SARS-CoV-2 public health emergency.  Safety protocols were in place, including screening questi

## 2022-03-31 NOTE — Assessment & Plan Note (Signed)
Deteriorated secondary to seasonal changes.  ? ?Continue albuterol PRN for now, discussed that if he continues to use albuterol more than three times weekly then we may need ICS. He will update. ? ?Continue Singulair 10 mg daily.  ?Continue Allegra 180 mg daily.  ? ?He will update. ?

## 2022-03-31 NOTE — Assessment & Plan Note (Signed)
Evaluated by pulmonology and May 2022, determined not to have a risk for sleep apnea.  No sleep study was completed. ? ?I offered neurology evaluation for which he currently declines. ?He will update if he changes his mind. ? ?

## 2022-03-31 NOTE — Assessment & Plan Note (Signed)
Stable, continue tizanidine 4 mg as needed. ?

## 2022-03-31 NOTE — Telephone Encounter (Signed)
Did you do this when he was in office today?  ?

## 2022-03-31 NOTE — Assessment & Plan Note (Signed)
Repeat lipid panel pending. Continue rosuvastatin 5 mg daily. 

## 2022-04-01 NOTE — Telephone Encounter (Signed)
Called patient he has picked up 30day and will let us know in a week if working  ?

## 2022-04-01 NOTE — Telephone Encounter (Signed)
Yes.  Did he pick up the 30-day supply?  Or is the pharmacy requiring him to have 30?  We are just starting this medication which is why 30 was ordered ?

## 2022-04-09 ENCOUNTER — Other Ambulatory Visit: Payer: Self-pay | Admitting: Primary Care

## 2022-04-09 DIAGNOSIS — R3915 Urgency of urination: Secondary | ICD-10-CM

## 2022-04-09 MED ORDER — TAMSULOSIN HCL 0.4 MG PO CAPS: 0.4000 mg | ORAL_CAPSULE | Freq: Every day | ORAL | 3 refills | Status: DC

## 2022-04-17 ENCOUNTER — Telehealth: Payer: Self-pay

## 2022-04-17 NOTE — Telephone Encounter (Signed)
[facility] Night - Client TELEPHONE ADVICE RECORD AccessNurse Patient Name: PRESTYN STANCO [place] Gender: Male DOB: Mar 28, 1946 Age: 76 Y 3 M 20 D Return Phone Number: [phone] (Primary) Address[place] [address] Client [facility] Night - Client Client Site [facility] - Night Provider [name] - NP Contact Type Call Who Is Calling Patient / Member / Family / Caregiver Call Type Triage / Clinical Relationship To Patient Self Return Phone Number [phone] (Primary) Chief Complaint OVERDOSE took too much medication at once Reason for Call Symptomatic / Request for Health Information Initial Comment Caller states he doubled his night medications. He wants to know if there are going to be any issues. Translation No Nurse Assessment Nurse: Gertie Gowda, RN, [place] Date/Time (Eastern Time): 04/16/2022 5:36:12 PM Confirm and document reason for call. If symptomatic, describe symptoms. ---Caller states he doubled his night medications, Metformin 1000mg , metoprolol 25mg s, montelukast 10mg , simvastatin 5mg s. Took double dose of these medications around 5:30pm. Does the patient have any new or worsening symptoms? ---Yes Will a triage be completed? ---Yes Related visit to physician within the last 2 weeks? ---No Does the PT have any chronic conditions? (i.e. diabetes, asthma, this includes High risk factors for pregnancy, etc.) ---Yes List chronic conditions. ---Tachycardia DM II HLD Is this a behavioral health or substance abuse call? ---No Guidelines Guideline Title Affirmed Question Affirmed Notes Nurse Date/Time (Eastern Time) Poisoning [1] DOUBLE DOSE (an extra dose or lesser amount) of prescription drug AND [2] NO symptoms (Exception: A double dose of antibiotics.) [place], RN, Cortney 04/16/2022 5:39:24 PM PLEASE NOTE: All timestamps contained within this report are  represented as Standard Time. CONFIDENTIALTY NOTICE: This fax transmission is intended only for the addressee. It contains information that is legally privileged, confidential or otherwise protected from use or disclosure. If you are not the intended recipient, you are strictly prohibited from reviewing, disclosing, copying using or disseminating any of this information or taking any action in reliance on or regarding this information. If you have received this fax in error, please notify immediately by telephone so that we can arrange for its return to . Phone: [phone], Toll-Free: [phone], Fax: [phone] Page: 2 of 2 Call Id: [place] Disp. Time [phone] Time) Disposition Final User 04/16/2022 5:34:51 PM Send to Urgent Queue [phone] 04/16/2022 5:48:13 PM Called On-Call Provider [name], RN, Cortney 04/16/2022 5:45:44 PM Call PCP Now Yes Rhetta Mura, RN, [place] Caller Disagree/Comply Comply Caller Understands Yes PreDisposition Did not know what to do Care Advice Given Per Guideline ALTERNATE DISPOSITION - CALL POISON CENTER NOW: * You need to call the [facility] now. [place] [facility] NUMBER: Gertie Gowda Po[facility]: [phone] * This number will automatically connect you with your local poison center. CARE ADVICE given per Poisoning (Adult) guideline. CALL PCP NOW: * You need to discuss this with your doctor (or NP/PA). * I'll page the on-call provider now. If you haven't heard from the provider (or me) within 30 minutes, call again. Comments User: Gertie Gowda, RN Date/Time [facility] Time): 04/16/2022 5:51:12 PM advised patient on sx to look for and to call back if any concerns over night. Advised to take blood glucose an additional time and eat a bedtime snack. Paging [facility] Phone DateTime Result/ Outcome Message Type Notes Geannie Risen [name] 04/16/2022 5:48:13 PM Called On Call Provider - Reached Doctor Paged [facility] 04/16/2022 5:49:24 PM Spoke with On Call - General Message Result  MD advised the advice from poison control was accurate and patient did not take lethal dose

## 2022-04-17 NOTE — Telephone Encounter (Signed)
He should be okay with the one incidence of doubling his medications.  Have him call us back if he has any further questions or new symptoms.

## 2022-04-17 NOTE — Telephone Encounter (Signed)
Called patient reviewed all information and repeated back to me. Will call if any questions.  He has not had any symptoms at all.

## 2022-04-17 NOTE — Telephone Encounter (Addendum)
Unable to reach pt by phone to get update on how pt is this morning. Sending note to Allayne Gitelman NP and [facility] CMA.  Pt called back and said pt has had no side effects from taking the extra med on 04/16/22.pt said his BP and BS was OK on 04/16/22; pt rested well last night and pt has not taken FBS this morning so far but has taken BP this morning BP 141/74 P 63. Pt has a medicine box that he prepares each week and pt had missed taking meds dose on Tues and took missed dose from Tues on Thurs and then pt saw his Thurs med was still in med box and took it also and pt then realized what had happened. Pt will cb if needed. Sending note to Allayne Gitelman NP and [facility] CMA.

## 2022-04-20 ENCOUNTER — Other Ambulatory Visit: Payer: Self-pay | Admitting: Primary Care

## 2022-04-20 DIAGNOSIS — E119 Type 2 diabetes mellitus without complications: Secondary | ICD-10-CM

## 2022-06-03 ENCOUNTER — Other Ambulatory Visit: Payer: Self-pay | Admitting: Primary Care

## 2022-06-03 DIAGNOSIS — J4540 Moderate persistent asthma, uncomplicated: Secondary | ICD-10-CM

## 2022-08-13 ENCOUNTER — Other Ambulatory Visit: Payer: Self-pay | Admitting: Primary Care

## 2022-08-13 DIAGNOSIS — H938X3 Other specified disorders of ear, bilateral: Secondary | ICD-10-CM

## 2022-08-13 DIAGNOSIS — J4540 Moderate persistent asthma, uncomplicated: Secondary | ICD-10-CM

## 2022-09-08 DIAGNOSIS — H401131 Primary open-angle glaucoma, bilateral, mild stage: Secondary | ICD-10-CM | POA: Diagnosis not present

## 2022-09-29 ENCOUNTER — Ambulatory Visit (INDEPENDENT_AMBULATORY_CARE_PROVIDER_SITE_OTHER): Payer: Medicare HMO

## 2022-09-29 VITALS — Ht 69.0 in | Wt 204.0 lb

## 2022-09-29 DIAGNOSIS — Z0000 Encounter for general adult medical examination without abnormal findings: Secondary | ICD-10-CM | POA: Diagnosis not present

## 2022-09-29 NOTE — Patient Instructions (Signed)
Brian Mullen , Thank you for taking time to come for your Medicare Wellness Visit. I appreciate your ongoing commitment to your health goals. Please review the following plan we discussed and let me know if I can assist you in the future.   Screening recommendations/referrals: Colonoscopy: not required Recommended yearly ophthalmology/optometry visit for glaucoma screening and checkup Recommended yearly dental visit for hygiene and checkup  Vaccinations: Influenza vaccine: due Pneumococcal vaccine: completed 09/08/2019 Tdap vaccine: completed 09/22/2013, due 09/23/2023 Shingles vaccine: discussed   Covid-19:  decline  Advanced directives: copy in chart  Conditions/risks identified: none  Next appointment: Follow up in one year for your annual wellness visit.   Preventive Care 65 Years and Older, Male Preventive care refers to lifestyle choices and visits with your health care provider that can promote health and wellness. What does preventive care include? A yearly physical exam. This is also called an annual well check. Dental exams once or twice a year. Routine eye exams. Ask your health care provider how often you should have your eyes checked. Personal lifestyle choices, including: Daily care of your teeth and gums. Regular physical activity. Eating a healthy diet. Avoiding tobacco and drug use. Limiting alcohol use. Practicing safe sex. Taking low doses of aspirin every day. Taking vitamin and mineral supplements as recommended by your health care provider. What happens during an annual well check? The services and screenings done by your health care provider during your annual well check will depend on your age, overall health, lifestyle risk factors, and family history of disease. Counseling  Your health care provider may ask you questions about your: Alcohol use. Tobacco use. Drug use. Emotional well-being. Home and relationship well-being. Sexual activity. Eating  habits. History of falls. Memory and ability to understand (cognition). Work and work Astronomer. Screening  You may have the following tests or measurements: Height, weight, and BMI. Blood pressure. Lipid and cholesterol levels. These may be checked every 5 years, or more frequently if you are over 90 years old. Skin check. Lung cancer screening. You may have this screening every year starting at age 51 if you have a 30-pack-year history of smoking and currently smoke or have quit within the past 15 years. Fecal occult blood test (FOBT) of the stool. You may have this test every year starting at age 24. Flexible sigmoidoscopy or colonoscopy. You may have a sigmoidoscopy every 5 years or a colonoscopy every 10 years starting at age 10. Prostate cancer screening. Recommendations will vary depending on your family history and other risks. Hepatitis C blood test. Hepatitis B blood test. Sexually transmitted disease (STD) testing. Diabetes screening. This is done by checking your blood sugar (glucose) after you have not eaten for a while (fasting). You may have this done every 1-3 years. Abdominal aortic aneurysm (AAA) screening. You may need this if you are a current or former smoker. Osteoporosis. You may be screened starting at age 51 if you are at high risk. Talk with your health care provider about your test results, treatment options, and if necessary, the need for more tests. Vaccines  Your health care provider may recommend certain vaccines, such as: Influenza vaccine. This is recommended every year. Tetanus, diphtheria, and acellular pertussis (Tdap, Td) vaccine. You may need a Td booster every 10 years. Zoster vaccine. You may need this after age 70. Pneumococcal 13-valent conjugate (PCV13) vaccine. One dose is recommended after age 4. Pneumococcal polysaccharide (PPSV23) vaccine. One dose is recommended after age 83. Talk to your health  care provider about which screenings and  vaccines you need and how often you need them. This information is not intended to replace advice given to you by your health care provider. Make sure you discuss any questions you have with your health care provider. Document Released: 11/29/2015 Document Revised: 07/22/2016 Document Reviewed: 09/03/2015 Elsevier Interactive Patient Education  2017 [employer] Prevention in the Home Falls can cause injuries. They can happen to people of all ages. There are many things you can do to make your home safe and to help prevent falls. What can I do on the outside of my home? Regularly fix the edges of walkways and driveways and fix any cracks. Remove anything that might make you trip as you walk through a door, such as a raised step or threshold. Trim any bushes or trees on the path to your home. Use bright outdoor lighting. Clear any walking paths of anything that might make someone trip, such as rocks or tools. Regularly check to see if handrails are loose or broken. Make sure that both sides of any steps have handrails. Any raised decks and porches should have guardrails on the edges. Have any leaves, snow, or ice cleared regularly. Use sand or salt on walking paths during winter. Clean up any spills in your garage right away. This includes oil or grease spills. What can I do in the bathroom? Use night lights. Install grab bars by the toilet and in the tub and shower. Do not use towel bars as grab bars. Use non-skid mats or decals in the tub or shower. If you need to sit down in the shower, use a plastic, non-slip stool. Keep the floor dry. Clean up any water that spills on the floor as soon as it happens. Remove soap buildup in the tub or shower regularly. Attach bath mats securely with double-sided non-slip rug tape. Do not have throw rugs and other things on the floor that can make you trip. What can I do in the bedroom? Use night lights. Make sure that you have a light by your  bed that is easy to reach. Do not use any sheets or blankets that are too big for your bed. They should not hang down onto the floor. Have a firm chair that has side arms. You can use this for support while you get dressed. Do not have throw rugs and other things on the floor that can make you trip. What can I do in the kitchen? Clean up any spills right away. Avoid walking on wet floors. Keep items that you use a lot in easy-to-reach places. If you need to reach something above you, use a strong step stool that has a grab bar. Keep electrical cords out of the way. Do not use floor polish or wax that makes floors slippery. If you must use wax, use non-skid floor wax. Do not have throw rugs and other things on the floor that can make you trip. What can I do with my stairs? Do not leave any items on the stairs. Make sure that there are handrails on both sides of the stairs and use them. Fix handrails that are broken or loose. Make sure that handrails are as long as the stairways. Check any carpeting to make sure that it is firmly attached to the stairs. Fix any carpet that is loose or worn. Avoid having throw rugs at the top or bottom of the stairs. If you do have throw rugs, attach them to  the floor with carpet tape. Make sure that you have a light switch at the top of the stairs and the bottom of the stairs. If you do not have them, ask someone to add them for you. What else can I do to help prevent falls? Wear shoes that: Do not have high heels. Have rubber bottoms. Are comfortable and fit you well. Are closed at the toe. Do not wear sandals. If you use a stepladder: Make sure that it is fully opened. Do not climb a closed stepladder. Make sure that both sides of the stepladder are locked into place. Ask someone to hold it for you, if possible. Clearly mark and make sure that you can see: Any grab bars or handrails. First and last steps. Where the edge of each step is. Use tools that  help you move around (mobility aids) if they are needed. These include: Canes. Walkers. Scooters. Crutches. Turn on the lights when you go into a dark area. Replace any light bulbs as soon as they burn out. Set up your furniture so you have a clear path. Avoid moving your furniture around. If any of your floors are uneven, fix them. If there are any pets around you, be aware of where they are. Review your medicines with your doctor. Some medicines can make you feel dizzy. This can increase your chance of falling. Ask your doctor what other things that you can do to help prevent falls. This information is not intended to replace advice given to you by your health care provider. Make sure you discuss any questions you have with your health care provider. Document Released: 08/29/2009 Document Revised: 04/09/2016 Document Reviewed: 12/07/2014 Elsevier Interactive Patient Education  2017 [employer].

## 2022-09-29 NOTE — Progress Notes (Signed)
I connected with Brian Mullen today by telephone and verified that I am speaking with the correct person using two identifiers. Location patient: home Location provider: work Persons participating in the virtual visit: Brian Mullen, Brian Durand LPN.   I discussed the limitations, risks, security and privacy concerns of performing an evaluation and management service by telephone and the availability of in person appointments. I also discussed with the patient that there may be a patient responsible charge related to this service. The patient expressed understanding and verbally consented to this telephonic visit.    Interactive audio and video telecommunications were attempted between this provider and patient, however failed, due to patient having technical difficulties OR patient did not have access to video capability.  We continued and completed visit with audio only.     Vital signs may be patient reported or missing.  Subjective:   Brian Mullen is a 75 y.o. male who presents for Medicare Annual/Subsequent preventive examination.  Review of Systems     Cardiac Risk Factors include: advanced age (>40mn, >>15women);diabetes mellitus;dyslipidemia;hypertension;male gender;obesity (BMI >30kg/m2)     Objective:    Today's Vitals   09/29/22 1612 09/29/22 1613  Weight: 204 lb (92.5 kg)   Height: _0  (1.753 m)   PainSc:  6    Body mass index is 30.13 kg/m.     09/29/2022    4:24 PM 02/26/2021   11:25 AM 08/18/2019   10:11 AM 08/18/2019   10:10 AM 03/06/2019    9:29 AM 02/22/2018    8:12 AM 09/15/2017    2:30 AM  Advanced Directives  Does Patient Have a Medical Advance Directive? Yes No Yes No Yes Yes Yes  Type of [place]amed[place]of [place] will  Living will  [place] will [place] Lakes[place] will [place]ordoLiving will  Does patient want to make changes to medical advance directive?   No  - Patient declined  No - Patient declined  No - Patient declined  Copy of [place] Chart? Yes - validated most recent copy scanned in chart (See row information)    No - copy requested No - copy requested No - copy requested  Would patient like information on creating a medical advance directive?  No - Patient declined  No - Patient declined       Current Medications (verified) Outpatient Encounter Medications as of 09/29/2022  Medication Sig   ACCU-CHEK AVIVA PLUS test strip USE AS INSTRUCTED TO TEST BLOOD SUGAR 2 TIMES DAILY   albuterol (VENTOLIN HFA) 108 (90 Base) MCG/ACT inhaler INHALE 1 TO 2 PUFFS EVERY 4 TO 6 HOURS AS NEEDED   Alcohol Swabs (B-D SINGLE USE SWABS REGULAR) PADS Use to check blood sugars.   Blood Glucose Monitoring Suppl (ONE TOUCH ULTRA 2) w/Device KIT Use as instructed to blood sugar 2 times daily.   fexofenadine (ALLEGRA) 180 MG tablet Take 180 mg by mouth daily.   fluticasone (FLONASE) 50 MCG/ACT nasal spray PLACE 1 SPRAY INTO BOTH NOSTRILS 2   TIMES DAILY.   Lancets (ONETOUCH ULTRASOFT) lancets Use as instructed to test blood sugar 2 times daily.   latanoprost (XALATAN) 0.005 % ophthalmic solution SMARTSIG:1 Drop(s) In Eye(s) Every Evening   metFORMIN (GLUCOPHAGE) 500 MG tablet TAKE 2 TABLETS (1,000 MG TOTAL) BY MOUTH 2 (TWO) TIMES DAILY WITH A MEAL FOR DIABETES   metoprolol succinate (TOPROL-XL) 25 MG 24 hr tablet Take 1 tablet (25 mg total) by  mouth daily. for high blood pressure   montelukast (SINGULAIR) 10 MG tablet TAKE 1 TABLET EVERY DAY AT BEDTIME FOR ALLERGIES / ASTHMA   omeprazole (PRILOSEC) 20 MG capsule Take 40 mg by mouth daily.   tamsulosin (FLOMAX) 0.4 MG CAPS capsule Take 1 capsule (0.4 mg total) by mouth daily. For urine flow and urgency   tiotropium (SPIRIVA HANDIHALER) 18 MCG inhalation capsule Place 1 capsule (18 mcg total) into inhaler and inhale daily.   tiZANidine (ZANAFLEX) 4 MG tablet TAKE 1 TABLET AT BEDTIME AS NEEDED FOR  MUSCLE SPASM(S)   Continuous Blood Gluc Sensor (FREESTYLE LIBRE 2 SENSOR) MISC Apply every 14 days to check blood sugars. (Patient not taking: Reported on 09/29/2022)   dorzolamide-timolol (COSOPT) 22.3-6.8 MG/ML ophthalmic solution Place 1 drop into the left eye 2 (two) times daily. (Patient not taking: Reported on 09/29/2022)   ketorolac (ACULAR) 0.5 % ophthalmic solution  (Patient not taking: Reported on 09/29/2022)   prednisoLONE acetate (PRED FORTE) 1 % ophthalmic suspension Place 1 drop into both eyes. (Patient not taking: Reported on 09/29/2022)   rosuvastatin (CRESTOR) 5 MG tablet TAKE 1 TABLET EVERY EVENING FOR CHOLESTEROL (Patient not taking: Reported on 09/29/2022)   SIMBRINZA 1-0.2 % SUSP Apply 1 drop to eye 2 (two) times daily. (Patient not taking: Reported on 09/29/2022)   No facility-administered encounter medications on file as of 09/29/2022.    Allergies (verified) Coconut (cocos nucifera), Iodine, Shellfish allergy, Shellfish-derived products, and Amlodipine   History: Past Medical History:  Diagnosis Date   Allergy    Asthma    Diabetes mellitus without complication ([place])    GERD (gastroesophageal reflux disease)    Hypertension    Palpitations    a. 10/2019: Monitor showing rare PVC's and no significant arrhythmias.    Past Surgical History:  Procedure Laterality Date   CATARACT EXTRACTION Bilateral    02/23/2022, 03/28/2022   ELBOW SURGERY     KNEE SURGERY Right    TONSILLECTOMY     Family History  Problem Relation Age of Onset   Pulmonary embolism Father 17       Deceased   Heart attack Mother 18       Deceased   Diabetes Maternal Grandmother    Heart attack Sister    Social History   Socioeconomic History   Marital status: Widowed    Spouse name: Not on file   Number of children: Not on file   Years of education: Not on file   Highest education level: Not on file  Occupational History   Not on file  Tobacco Use   Smoking status: Former     Packs/day: 4.00    Years: 12.00    Total pack years: 48.00    Types: Cigarettes    Quit date: 49    Years since quitting: 47.9   Smokeless tobacco: Never   Tobacco comments:    used to smoke Lucky's  Vaping Use   Vaping Use: Never used  Substance and Sexual Activity   Alcohol use: Yes    Alcohol/week: 1.0 - 2.0 standard drink of alcohol    Types: 1 - 2 Standard drinks or equivalent per week    Comment: occ   Drug use: No   Sexual activity: Not on file  Other Topics Concern   Not on file  Social History Narrative   Married.   No children   Retried. Worked as a Furniture conservator/restorer.   Enjoys working on [employer], yard work,  gardening.   Social Determinants of Health   Financial Resource Strain: Low Risk  (09/29/2022)   Overall Financial Resource Strain (CARDIA)    Difficulty of Paying Living Expenses: Not hard at all  Food Insecurity: No Food Insecurity (09/29/2022)   Hunger Vital Sign    Worried About Running Out of Food in the Last Year: Never true    Ran Out of Food in the Last Year: Never true  Transportation Needs: No Transportation Needs (09/29/2022)   PRAPARE - Hydrologist (Medical): No    Lack of Transportation (Non-Medical): No  Physical Activity: Inactive (09/29/2022)   Exercise Vital Sign    Days of Exercise per Week: 0 days    Minutes of Exercise per Session: 0 min  Stress: No Stress Concern Present (09/29/2022)   [place]    Feeling of Stress : Not at all  Social Connections: Not on file    Tobacco Counseling Counseling given: Not Answered Tobacco comments: used to smoke Lucky's   Clinical Intake:  Pre-visit preparation completed: Yes  Pain : 0-10 Pain Score: 6  Pain Type: Chronic pain Pain Location: Generalized Pain Orientation: Right Pain Descriptors / Indicators: Aching Pain Onset: More than a month ago Pain Frequency: Constant      Nutritional Status: BMI > 30  Obese Nutritional Risks: Nausea/ vomitting/ diarrhea (diarrhea for the last month) Diabetes: Yes  How often do you need to have someone help you when you read instructions, pamphlets, or other written materials from your doctor or pharmacy?: 1 - Never  Diabetic? Yes Nutrition Risk Assessment:  Has the patient had any N/V/D within the last 2 months?  Yes  Does the patient have any non-healing wounds?  No  Has the patient had any unintentional weight loss or weight gain?  No   Diabetes:  Is the patient diabetic?  Yes  If diabetic, was a CBG obtained today?  No  Did the patient bring in their glucometer from home?  No  How often do you monitor your CBG's? When feels bad.   Financial Strains and Diabetes Management:  Are you having any financial strains with the device, your supplies or your medication? No .  Does the patient want to be seen by Chronic Care Management for management of their diabetes?  No  Would the patient like to be referred to a Nutritionist or for Diabetic Management?  No   Diabetic Exams:  Diabetic Eye Exam: Overdue for diabetic eye exam. Pt has been advised about the importance in completing this exam. Patient advised to call and schedule an eye exam. Diabetic Foot Exam: Completed 09/30/2021   Interpreter Needed?: No  Information entered by :: NAllen LPN   Activities of Daily Living    09/29/2022    4:25 PM 03/31/2022   10:22 AM  In your present state of health, do you have any difficulty performing the following activities:  Hearing? 1 0  Vision? 0 1  Comment  seen by eye dr  Difficulty concentrating or making decisions? 0 0  Walking or climbing stairs? 1 1  Comment  has cain  Dressing or bathing? 0 1  Comment  issues with dressing  Doing errands, shopping? 0 0  Preparing Food and eating ? N   Using the Toilet? N   In the past six months, have you accidently leaked urine? Y   Do you have problems with loss of  bowel control?  N   Managing your Medications? N   Managing your Finances? N   Housekeeping or managing your Housekeeping? N     Patient Care Team: Pleas Koch, NP as PCP - General (Nurse Practitioner) Werner Lean, MD as PCP - Cardiology (Cardiology)  Indicate any recent Medical Services you may have received from other than [facility] providers in the past year (date may be approximate).     Assessment:   This is a routine wellness examination for Dariel.  Hearing/Vision screen Vision Screening - Comments:: Regular eye exams, [place]  Dietary issues and exercise activities discussed: Current Exercise Habits: The patient does not participate in regular exercise at present   Goals Addressed             This Visit's Progress    Patient Stated       09/29/2022, wants to get around without pain       Depression Screen    09/29/2022    4:25 PM 03/31/2022   10:21 AM 03/28/2021   11:26 AM 09/06/2020   11:11 AM 03/06/2019    9:17 AM 02/22/2018    8:15 AM 11/30/2016   11:41 AM  PHQ 2/9 Scores  PHQ - 2 Score 0 0 0 0 0 1 0  PHQ- 9 Score  _0 0 6     Fall Risk    09/29/2022    4:25 PM 03/31/2022   10:23 AM 03/28/2021   11:26 AM 09/06/2020   11:12 AM 03/06/2019    9:17 AM  Fall Risk   Falls in the past year? 0 0 0 1 0  Number falls in past yr: 0 0 0 0   Injury with Fall? 0 0 0 0   Risk for fall due to : Medication side effect Impaired balance/gait     Follow up Falls prevention discussed;Education provided;Falls evaluation completed Falls evaluation completed       FALL RISK PREVENTION PERTAINING TO THE HOME:  Any stairs in or around the home? Yes  If so, are there any without handrails? No  Home free of loose throw rugs in walkways, pet beds, electrical cords, etc? Yes  Adequate lighting in your home to reduce risk of falls? Yes   ASSISTIVE DEVICES UTILIZED TO PREVENT FALLS:  Life alert? No  Use of a cane, walker or w/c? No  Grab bars in the  bathroom? Yes  Shower chair or bench in shower? Yes  Elevated toilet seat or a handicapped toilet? No   TIMED UP AND GO:  Was the test performed? No .      Cognitive Function:    03/06/2019    9:17 AM 02/22/2018    8:29 AM 11/30/2016   11:59 AM  MMSE - Mini Mental State Exam  Orientation to time _1 Orientation to Place _2 Registration _3 Attention/ Calculation 0 0 0  Recall _4 Recall-comments unable to recall 1 of 3 words    Language- name 2 objects 0 0 0  Language- repeat _5 Language- follow 3 step command 0 3 3  Language- read & follow direction 0 0 0  Write a sentence 0 0 0  Copy design 0 0 0  Total score _6 09/29/2022    4:27 PM  6CIT Screen  What Year? 0 points  What month? 0 points  What time? 0  points  Count back from 20 0 points  Months in reverse 4 points  Repeat phrase 2 points  Total Score 6 points    Immunizations Immunization History  Administered Date(s) Administered   Fluad Quad(high Dose 65+) 09/30/2021   Influenza,inj,Quad PF,6+ Mos 08/18/2013, 10/29/2014, 11/30/2016, 06/27/2019   Influenza-Unspecified 08/18/2013, 10/29/2014, 11/30/2016, 06/27/2019, 09/30/2021   Pneumococcal Conjugate-13 09/22/2013   Pneumococcal Polysaccharide-23 10/29/2014, 09/08/2019   Td 09/22/2013   Td (Adult) 09/22/2013   Zoster, Live 12/14/2016    TDAP status: Up to date  Flu Vaccine status: Due, Education has been provided regarding the importance of this vaccine. Advised may receive this vaccine at local pharmacy or Health Dept. Aware to provide a copy of the vaccination record if obtained from local pharmacy or Health Dept. Verbalized acceptance and understanding.  Pneumococcal vaccine status: Up to date  Covid-19 vaccine status: Declined, Education has been provided regarding the importance of this vaccine but patient still declined. Advised may receive this vaccine at local pharmacy or Health Dept.or vaccine clinic. Aware to  provide a copy of the vaccination record if obtained from local pharmacy or Health Dept. Verbalized acceptance and understanding.  Qualifies for Shingles Vaccine? Yes   Zostavax completed Yes   Shingrix Completed?: No.    Education has been provided regarding the importance of this vaccine. Patient has been advised to call insurance company to determine out of pocket expense if they have not yet received this vaccine. Advised may also receive vaccine at local pharmacy or Health Dept. Verbalized acceptance and understanding.  Screening Tests Health Maintenance  Topic Date Due   Zoster Vaccines- Shingrix (1 of 2) Never done   Medicare Annual Wellness (AWV)  03/05/2020   INFLUENZA VACCINE  06/16/2022   OPHTHALMOLOGY EXAM  08/06/2022   Diabetic kidney evaluation - Urine ACR  09/30/2022   FOOT EXAM  09/30/2022   HEMOGLOBIN A1C  10/01/2022   Diabetic kidney evaluation - GFR measurement  04/01/2023   TETANUS/TDAP  09/23/2023   Pneumonia Vaccine 22+ Years old  Completed   Hepatitis C Screening  Completed   HPV VACCINES  Aged Out   COVID-19 Vaccine  Discontinued   Fecal DNA (Cologuard)  Discontinued    Health Maintenance  Health Maintenance Due  Topic Date Due   Zoster Vaccines- Shingrix (1 of 2) Never done   Medicare Annual Wellness (AWV)  03/05/2020   INFLUENZA VACCINE  06/16/2022   OPHTHALMOLOGY EXAM  08/06/2022   Diabetic kidney evaluation - Urine ACR  09/30/2022    Colorectal cancer screening: No longer required.   Lung Cancer Screening: (Low Dose CT Chest recommended if Age 33-80 years, 30 pack-year currently smoking OR have quit w/in 15years.) does not qualify.   Lung Cancer Screening Referral: no  Additional Screening:  Hepatitis C Screening: does qualify; Completed 11/30/2016  Vision Screening: Recommended annual ophthalmology exams for early detection of glaucoma and other disorders of the eye. Is the patient up to date with their annual eye exam?  Yes  Who is the  provider or what is the name of the office in which the patient attends annual eye exams? [place] If pt is not established with a provider, would they like to be referred to a provider to establish care? No .   Dental Screening: Recommended annual dental exams for proper oral hygiene  Community Resource Referral / Chronic Care Management: CRR required this visit?  No   CCM required this visit?  No      Plan:  I have personally reviewed and noted the following in the patient's chart:   Medical and social history Use of alcohol, tobacco or illicit drugs  Current medications and supplements including opioid prescriptions. Patient is not currently taking opioid prescriptions. Functional ability and status Nutritional status Physical activity Advanced directives List of other physicians Hospitalizations, surgeries, and ER visits in previous 12 months Vitals Screenings to include cognitive, depression, and falls Referrals and appointments  In addition, I have reviewed and discussed with patient certain preventive protocols, quality metrics, and best practice recommendations. A written personalized care plan for preventive services as well as general preventive health recommendations were provided to patient.     Kellie Simmering, LPN   [id number]   Nurse Notes: none  Due to this being a virtual visit, the after visit summary with patients personalized plan was offered to patient via mail or my-chart.  to pick up at office at next visit

## 2022-10-13 ENCOUNTER — Encounter: Payer: Self-pay | Admitting: Primary Care

## 2022-10-13 ENCOUNTER — Ambulatory Visit (INDEPENDENT_AMBULATORY_CARE_PROVIDER_SITE_OTHER)
Admission: RE | Admit: 2022-10-13 | Discharge: 2022-10-13 | Disposition: A | Discharge status: Home / Self Care | Payer: Medicare HMO | Source: Ambulatory Visit | Attending: Primary Care | Admitting: Primary Care

## 2022-10-13 ENCOUNTER — Telehealth: Payer: Self-pay

## 2022-10-13 ENCOUNTER — Encounter: Payer: Self-pay | Admitting: Neurology

## 2022-10-13 ENCOUNTER — Ambulatory Visit (INDEPENDENT_AMBULATORY_CARE_PROVIDER_SITE_OTHER): Payer: Medicare HMO | Admitting: Primary Care

## 2022-10-13 VITALS — BP 120/74 | HR 67 | Temp 98.4°F | Ht 69.0 in | Wt 199.0 lb

## 2022-10-13 DIAGNOSIS — G8929 Other chronic pain: Secondary | ICD-10-CM | POA: Diagnosis not present

## 2022-10-13 DIAGNOSIS — M19012 Primary osteoarthritis, left shoulder: Secondary | ICD-10-CM | POA: Diagnosis not present

## 2022-10-13 DIAGNOSIS — M25561 Pain in right knee: Secondary | ICD-10-CM | POA: Diagnosis not present

## 2022-10-13 DIAGNOSIS — M25512 Pain in left shoulder: Secondary | ICD-10-CM | POA: Diagnosis not present

## 2022-10-13 DIAGNOSIS — D649 Anemia, unspecified: Secondary | ICD-10-CM | POA: Diagnosis not present

## 2022-10-13 DIAGNOSIS — E1165 Type 2 diabetes mellitus with hyperglycemia: Secondary | ICD-10-CM | POA: Diagnosis not present

## 2022-10-13 DIAGNOSIS — M25562 Pain in left knee: Secondary | ICD-10-CM | POA: Diagnosis not present

## 2022-10-13 DIAGNOSIS — R42 Dizziness and giddiness: Secondary | ICD-10-CM | POA: Diagnosis not present

## 2022-10-13 DIAGNOSIS — R3915 Urgency of urination: Secondary | ICD-10-CM | POA: Diagnosis not present

## 2022-10-13 LAB — CBC WITH DIFFERENTIAL/PLATELET
Basophils Absolute: 0.1 10*3/uL (ref 0.0–0.1)
Basophils Relative: 0.5 % (ref 0.0–3.0)
Eosinophils Absolute: 0.3 10*3/uL (ref 0.0–0.7)
Eosinophils Relative: 2.8 % (ref 0.0–5.0)
HCT: 40 % (ref 39.0–52.0)
Hemoglobin: 13.2 g/dL (ref 13.0–17.0)
Lymphocytes Relative: 24.6 % (ref 12.0–46.0)
Lymphs Abs: 2.3 10*3/uL (ref 0.7–4.0)
MCHC: 32.9 g/dL (ref 30.0–36.0)
MCV: 87.2 fl (ref 78.0–100.0)
Monocytes Absolute: 0.8 10*3/uL (ref 0.1–1.0)
Monocytes Relative: 8.3 % (ref 3.0–12.0)
Neutro Abs: 6 10*3/uL (ref 1.4–7.7)
Neutrophils Relative %: 63.8 % (ref 43.0–77.0)
Platelets: 346 10*3/uL (ref 150.0–400.0)
RBC: 4.59 Mil/uL (ref 4.22–5.81)
RDW: 14.3 % (ref 11.5–15.5)
WBC: 9.4 10*3/uL (ref 4.0–10.5)

## 2022-10-13 LAB — PSA, MEDICARE: PSA: 2.42 ng/ml (ref 0.10–4.00)

## 2022-10-13 LAB — MICROALBUMIN / CREATININE URINE RATIO
Creatinine,U: 90.6 mg/dL
Microalb Creat Ratio: 0.8 mg/g (ref 0.0–30.0)
Microalb, Ur: <0.7 mg/dL (ref 0.0–1.9)

## 2022-10-13 LAB — HEMOGLOBIN A1C: Hgb A1c MFr Bld: 7.6 % — ABNORMAL HIGH (ref 4.6–6.5)

## 2022-10-13 NOTE — Assessment & Plan Note (Signed)
Continued.  Discussed that metoprolol could be contributing but he disagrees. Referral placed for neurology evaluation. Reviewed CT head from 2022.

## 2022-10-13 NOTE — Assessment & Plan Note (Signed)
Mild, noted on labs from May 2023. No alarm signs during HPI.  Repeat CBC pending today.

## 2022-10-13 NOTE — Telephone Encounter (Addendum)
Jasmine RN with access nurse said she has pt on phone returning call to [place]. Jasmine transferred the call to me and I spoke with pt. Pt said [name] with neurology left v/m for pt to call back to [phone].  I warm transferred pt to neurology for pt to speak with [name] about neurology referral for appt. Sending FYI to [employer].   [facility] Day - Client Nonclinical Telephone Record  [facility] Client [facility] - Client Client Site [facility] - Day Provider [name] - NP Contact Type Call Who Is Calling Patient / Member / Family / Caregiver Caller Name Reyden Smith Caller Phone Number [phone] Patient Name Brian Mullen Patient DOB 1946-05-30 Call Type Message Only Information Provided Reason for Call Request to Schedule Office Appointment Initial Comment Caller stated he is returning a call to [facility] to schedule an appointment. Disp. Time Disposition Final User 10/13/2022 12:41:06 PM General Information Provided Yes White, Jasmine Call Closed By: Gerre Scull Transaction Date/Time: 10/13/2022 12:36:08 PM (ET

## 2022-10-13 NOTE — Progress Notes (Signed)
Subjective:    Patient ID: Brian Mullen, male    DOB: 1946/03/07, 76 y.o.   MRN: [id number]  HPI  Brian Mullen is a very pleasant 76 y.o. male with a history of hypertension, type 2 diabetes, hyperlipidemia, asthma, chronic back pain who presents today for follow up of diabetes and to discuss multiple concerns. His significant other joins [place] today.  1) Type 2 Diabetes: Current medications include: metformin 1000 mg BID.  He is checking his blood glucose inconsistently and is getting readings of 120's-130's.   Last A1C: 7.6 in May 2023 Last Eye Exam: UTD Last Foot Exam: Due Pneumonia Vaccination: UTD Urine Microalbumin: Due and pending. Statin: rosuvastatin   Dietary changes since last visit: His appetite has decreased. Home cooked meals mostly.    Exercise: Sedentary.   2) Mild Anemia: Noted on CBC from May 2023 with slight reduction in RBC's and hemoglobin. At the time he denied rectal bleeding or hematuria. Today he denies rectal bleeding, hematuria. He is due for repeat CBC today.   BP Readings from Last 3 Encounters:  10/13/22 120/74  03/31/22 112/70  02/13/22 (!) 110/58   3) Dizziness/Lightheadedness: Chronic for over 1 year. Occurs with rest and movement, mostly with movement. Evaluated by ENT last year and was told "there's nothing wrong". Recently evaluated by his eye doctor who tells him that "nothing's wrong". He did have his cataracts removed in early 2023. He underwent CT head in 2022 which was negative. He is managed on metoprolol succinate 25 mg daily. He checks HR during dizzy spells which runs in the 60's-70's.   4) Urinary Urgency: Chronic. Currently managed on tamsulosin 0.4 mg for which he's been taking for the last 1 year. Symptoms include urinary urgency, nocturia, urinary incontinence. His urinary frequency has improved but he continues to experience symptoms. He is getting up to use the bathroom.   5) Chronic Shoulder Pain: Chronic for years. Progressing  pain to the left anterior shoulder with movement such as driving, abduction to 90 degrees and higher. Evaluated by Sports Medicine previously who suspected rotator cuff vs impingement. He underwent several injections in the past which helped temporarily.   Chronic bilateral knee pain for years. Referred to PT last year but he never returned their calls back for scheduling. He would like to return.    Review of Systems  Cardiovascular:  Negative for chest pain.  Gastrointestinal:  Negative for blood in stool.  Genitourinary:  Positive for urgency.  Musculoskeletal:  Positive for arthralgias.  Neurological:  Positive for dizziness, light-headedness and numbness.         Past Medical History:  Diagnosis Date   Allergy    Asthma    Diabetes mellitus without complication (HCC)    GERD (gastroesophageal reflux disease)    Hypertension    Palpitations    a. 10/2019: Monitor showing rare PVC's and no significant arrhythmias.     Social History   Socioeconomic History   Marital status: Widowed    Spouse name: Not on file   Number of children: Not on file   Years of education: Not on file   Highest education level: Not on file  Occupational History   Not on file  Tobacco Use   Smoking status: Former    Packs/day: 4.00    Years: 12.00    Total pack years: 48.00    Types: Cigarettes    Quit date: 61    Years since quitting: 47.9   Smokeless tobacco: Never  Tobacco comments:    used to smoke Lucky's  Vaping Use   Vaping Use: Never used  Substance and Sexual Activity   Alcohol use: Yes    Alcohol/week: 1.0 - 2.0 standard drink of alcohol    Types: 1 - 2 Standard drinks or equivalent per week    Comment: occ   Drug use: No   Sexual activity: Not on file  Other Topics Concern   Not on file  Social History Narrative   Married.   No children   Retried. Worked as a Furniture conservator/restorer.   Enjoys working on [employer], yard work, gardening.   Social Determinants of  Health   Financial Resource Strain: Low Risk  (09/29/2022)   Overall Financial Resource Strain (CARDIA)    Difficulty of Paying Living Expenses: Not hard at all  Food Insecurity: No Food Insecurity (09/29/2022)   Hunger Vital Sign    Worried About Running Out of Food in the Last Year: Never true    Ran Out of Food in the Last Year: Never true  Transportation Needs: No Transportation Needs (09/29/2022)   PRAPARE - Hydrologist (Medical): No    Lack of Transportation (Non-Medical): No  Physical Activity: Inactive (09/29/2022)   Exercise Vital Sign    Days of Exercise per Week: 0 days    Minutes of Exercise per Session: 0 min  Stress: No Stress Concern Present (09/29/2022)   [place]    Feeling of Stress : Not at all  Social Connections: Not on file  Intimate Partner Violence: Not on file    Past Surgical History:  Procedure Laterality Date   CATARACT EXTRACTION Bilateral    02/23/2022, 03/28/2022   ELBOW SURGERY     KNEE SURGERY Right    TONSILLECTOMY      Family History  Problem Relation Age of Onset   Pulmonary embolism Father 10       Deceased   Heart attack Mother 42       Deceased   Diabetes Maternal Grandmother    Heart attack Sister     Allergies  Allergen Reactions   Coconut (Cocos Nucifera) Anaphylaxis   Iodine Anaphylaxis   Shellfish Allergy Anaphylaxis   Shellfish-Derived Products Anaphylaxis   Amlodipine Cough    LE Swelling    Current Outpatient Medications on File Prior to Visit  Medication Sig Dispense Refill   ACCU-CHEK AVIVA PLUS test strip USE AS INSTRUCTED TO TEST BLOOD SUGAR 2 TIMES DAILY 200 strip 0   albuterol (VENTOLIN HFA) 108 (90 Base) MCG/ACT inhaler INHALE 1 TO 2 PUFFS EVERY 4 TO 6 HOURS AS NEEDED 1 each 0   Alcohol Swabs (B-D SINGLE USE SWABS REGULAR) PADS Use to check blood sugars. 200 each 3   Blood Glucose Monitoring Suppl (ONE TOUCH  ULTRA 2) w/Device KIT Use as instructed to blood sugar 2 times daily. 1 each 0   fexofenadine (ALLEGRA) 180 MG tablet Take 180 mg by mouth daily.     fluticasone (FLONASE) 50 MCG/ACT nasal spray PLACE 1 SPRAY INTO BOTH NOSTRILS 2   TIMES DAILY. 48 g 1   Lancets (ONETOUCH ULTRASOFT) lancets Use as instructed to test blood sugar 2 times daily. 100 each 5   latanoprost (XALATAN) 0.005 % ophthalmic solution SMARTSIG:1 Drop(s) In Eye(s) Every Evening     metFORMIN (GLUCOPHAGE) 500 MG tablet TAKE 2 TABLETS (1,000 MG TOTAL) BY MOUTH 2 (TWO) TIMES DAILY  WITH A MEAL FOR DIABETES 360 tablet 1   metoprolol succinate (TOPROL-XL) 25 MG 24 hr tablet Take 1 tablet (25 mg total) by mouth daily. for high blood pressure 90 tablet 3   montelukast (SINGULAIR) 10 MG tablet TAKE 1 TABLET EVERY DAY AT BEDTIME FOR ALLERGIES / ASTHMA 90 tablet 0   omeprazole (PRILOSEC) 20 MG capsule Take 40 mg by mouth daily.     prednisoLONE acetate (PRED FORTE) 1 % ophthalmic suspension Place 1 drop into both eyes.     tamsulosin (FLOMAX) 0.4 MG CAPS capsule Take 1 capsule (0.4 mg total) by mouth daily. For urine flow and urgency 90 capsule 3   tiotropium (SPIRIVA HANDIHALER) 18 MCG inhalation capsule Place 1 capsule (18 mcg total) into inhaler and inhale daily. 30 capsule 2   tiZANidine (ZANAFLEX) 4 MG tablet TAKE 1 TABLET AT BEDTIME AS NEEDED FOR MUSCLE SPASM(S) 30 tablet 0   Continuous Blood Gluc Sensor (FREESTYLE LIBRE 2 SENSOR) MISC Apply every 14 days to check blood sugars. (Patient not taking: Reported on 10/13/2022) 6 each 1   dorzolamide-timolol (COSOPT) 22.3-6.8 MG/ML ophthalmic solution Place 1 drop into the left eye 2 (two) times daily. (Patient not taking: Reported on 10/13/2022)     ketorolac (ACULAR) 0.5 % ophthalmic solution  (Patient not taking: Reported on 09/29/2022)     rosuvastatin (CRESTOR) 5 MG tablet TAKE 1 TABLET EVERY EVENING FOR CHOLESTEROL (Patient not taking: Reported on 09/29/2022) 90 tablet 0   SIMBRINZA  1-0.2 % SUSP Apply 1 drop to eye 2 (two) times daily. (Patient not taking: Reported on 09/29/2022)     No current facility-administered medications on file prior to visit.    BP 120/74   Pulse 67   Temp 98.4 F (36.9 C) (Temporal)   Ht _0  (1.753 m)   Wt 199 lb (90.3 kg)   SpO2 98%   BMI 29.39 kg/m  Objective:   Physical Exam Cardiovascular:     Rate and Rhythm: Normal rate and regular rhythm.  Pulmonary:     Effort: Pulmonary effort is normal.     Breath sounds: Normal breath sounds. No wheezing or rales.  Musculoskeletal:     Left shoulder: Decreased range of motion.     Cervical back: Neck supple.     Right knee: Decreased range of motion.     Left knee: Decreased range of motion.     Comments: Decrease in ROM to left shoulder with abduction around 160 degrees. Strength equal bilaterally.  Skin:    General: Skin is warm and dry.  Neurological:     Mental Status: He is alert and oriented to person, place, and time.           Assessment & Plan:   Problem List Items Addressed This Visit       Endocrine   Type 2 diabetes mellitus with hyperglycemia ([place]) - Primary    Repeat A1C pending today.  Urine microalbumin due and pending. Managed on statin. Pneumonia vaccine UTD.  Continue metformin 1000 mg BID.  Follow up in 3-6 months based on A1C result.       Relevant Orders   Microalbumin/Creatinine Ratio, Urine   Hemoglobin A1c     Other   Left shoulder pain    Progressing.  No alarm signs on exam. Xrays of the left shoulder pending. Referral placed for physical therapy.   Offered orthopedic evaluation for which he declines for now.      Relevant Orders   DG  Shoulder Left   Ambulatory referral to Physical Therapy   Urinary urgency    Improved but with active symptoms. PSA pending.  Increase Tamsulosin to 0.8 mg daily. He will update in a few weeks. He will need new Rx at that time.      Relevant Orders   PSA, Medicare   Lightheadedness     Continued.  Discussed that metoprolol could be contributing but he disagrees. Referral placed for neurology evaluation. Reviewed CT head from 2022.      Relevant Orders   Ambulatory referral to Neurology   Knee pain, bilateral    Ongoing. Referral placed again for Physical therapy. Discussed for him to expect a phone call or letter via [facility].       Relevant Orders   Ambulatory referral to Physical Therapy   Anemia    Mild, noted on labs from May 2023. No alarm signs during HPI.  Repeat CBC pending today.      Relevant Orders   CBC with Differential/Platelet       Pleas Koch, NP

## 2022-10-13 NOTE — Patient Instructions (Addendum)
Stop by the lab and xray prior to leaving today. I will notify you of your results once received.   You will either be contacted via phone regarding your referral to neurology and physical therapy, or you may receive a letter on your MyChart portal from our referral team with instructions for scheduling an appointment. Please let us know if you have not been contacted by anyone within two weeks.  Increase your tamsulosin to 0.8 mg daily, this is two capsules. Please update me in 2 weeks as discussed.  Please schedule a physical to meet with me in 6 months.    It was a pleasure to see you today!

## 2022-10-13 NOTE — Assessment & Plan Note (Addendum)
Improved but with active symptoms. PSA pending.  Increase Tamsulosin to 0.8 mg daily. He will update in a few weeks. He will need new Rx at that time.

## 2022-10-13 NOTE — Assessment & Plan Note (Signed)
Repeat A1C pending today.  Urine microalbumin due and pending. Managed on statin. Pneumonia vaccine UTD.  Continue metformin 1000 mg BID.  Follow up in 3-6 months based on A1C result.

## 2022-10-13 NOTE — Assessment & Plan Note (Addendum)
Ongoing. Referral placed again for Physical therapy. Discussed for him to expect a phone call or letter via MyChart.

## 2022-10-13 NOTE — Assessment & Plan Note (Signed)
Progressing.  No alarm signs on exam. Xrays of the left shoulder pending. Referral placed for physical therapy.   Offered orthopedic evaluation for which he declines for now.

## 2022-10-14 ENCOUNTER — Other Ambulatory Visit: Payer: Self-pay | Admitting: Primary Care

## 2022-10-14 DIAGNOSIS — G8929 Other chronic pain: Secondary | ICD-10-CM

## 2022-10-25 NOTE — Progress Notes (Unsigned)
    Brian Culbreth T. Jordayn Mink, MD, CAQ Sports Medicine [facility] [address] [place], [address]  Phone: [phone]  FAX: [phone]  Brian Mullen - 76 y.o. male  MRN [id number]  Date of Birth: 1945/11/27  Date: 10/26/2022  PCP: Doreene Nest, NP  Referral: Doreene Nest, NP  No chief complaint on file.  Subjective:   Brian Mullen is a 76 y.o. very pleasant male patient with There is no height or weight on file to calculate BMI. who presents with the following:  He is a pleasant gentleman he presents with some history of left-sided chronic shoulder pain.  Actually saw him roughly 5 and half years ago for some shoulder pain, at that point I thought that he was having some frozen shoulder.  He made fairly rapid progress at that point and I saw him twice.  I did independently review the patient's plain x-rays from October 14, 2022, and he is glenohumeral space is relatively preserved with only some minimal to mild arthropathy.  Mrs. Chestine Spore did refer him to formal physical therapy for his shoulder.    Review of Systems is noted in the HPI, as appropriate  Objective:   There were no vitals taken for this visit.  GEN: No acute distress; alert,appropriate. PULM: Breathing comfortably in no respiratory distress PSYCH: Normally interactive.   Laboratory and Imaging Data:  Assessment and Plan:   ***

## 2022-10-26 ENCOUNTER — Encounter: Payer: Self-pay | Admitting: Family Medicine

## 2022-10-26 ENCOUNTER — Ambulatory Visit (INDEPENDENT_AMBULATORY_CARE_PROVIDER_SITE_OTHER): Payer: Medicare HMO | Admitting: Family Medicine

## 2022-10-26 VITALS — BP 112/60 | HR 80 | Temp 98.8°F | Ht 69.0 in | Wt 201.0 lb

## 2022-10-26 DIAGNOSIS — J4540 Moderate persistent asthma, uncomplicated: Secondary | ICD-10-CM | POA: Diagnosis not present

## 2022-10-26 DIAGNOSIS — G8929 Other chronic pain: Secondary | ICD-10-CM

## 2022-10-26 DIAGNOSIS — M25512 Pain in left shoulder: Secondary | ICD-10-CM

## 2022-10-26 MED ORDER — MONTELUKAST SODIUM 10 MG PO TABS: ORAL_TABLET | ORAL | 0 refills | Status: DC

## 2022-10-26 MED ORDER — TRIAMCINOLONE ACETONIDE 40 MG/ML IJ SUSP
40.0000 mg | Freq: Once | INTRAMUSCULAR | Status: AC
  Administered 2022-10-26: 40 mg via INTRA_ARTICULAR

## 2022-10-26 MED ORDER — TAMSULOSIN HCL 0.4 MG PO CAPS: 0.4000 mg | ORAL_CAPSULE | Freq: Two times a day (BID) | ORAL | 0 refills | Status: DC

## 2022-11-05 ENCOUNTER — Encounter (HOSPITAL_COMMUNITY): Payer: Self-pay | Admitting: Occupational Therapy

## 2022-11-05 ENCOUNTER — Ambulatory Visit (HOSPITAL_COMMUNITY): Payer: Medicare HMO | Attending: Internal Medicine | Admitting: Occupational Therapy

## 2022-11-05 DIAGNOSIS — M25512 Pain in left shoulder: Secondary | ICD-10-CM | POA: Diagnosis not present

## 2022-11-05 DIAGNOSIS — M25561 Pain in right knee: Secondary | ICD-10-CM | POA: Insufficient documentation

## 2022-11-05 DIAGNOSIS — G8929 Other chronic pain: Secondary | ICD-10-CM | POA: Diagnosis not present

## 2022-11-05 DIAGNOSIS — M25562 Pain in left knee: Secondary | ICD-10-CM | POA: Diagnosis not present

## 2022-11-05 DIAGNOSIS — R29898 Other symptoms and signs involving the musculoskeletal system: Secondary | ICD-10-CM | POA: Diagnosis not present

## 2022-11-05 DIAGNOSIS — R262 Difficulty in walking, not elsewhere classified: Secondary | ICD-10-CM | POA: Diagnosis not present

## 2022-11-05 NOTE — Therapy (Signed)
OUTPATIENT OCCUPATIONAL THERAPY ORTHO EVALUATION  Patient Name: Brian Mullen MRN: [id number] DOB:Aug 17, 1946, 76 y.o., male Today's Date: 11/05/2022  PCP: Alma Friendly, NP REFERRING PROVIDER: Alma Friendly, NP  END OF SESSION:  OT End of Session - 11/05/22 1339     Visit Number 1    Number of Visits 4    Date for OT Re-Evaluation 12/05/22    Authorization Type [employer]    Authorization Time Period Requesting 4 visits    Authorization - Visit Number 0    Authorization - Number of Visits 4    Progress Note Due on Visit 10    OT Start Time 1115    OT Stop Time 1150    OT Time Calculation (min) 35 min    Activity Tolerance Patient tolerated treatment well    Behavior During Therapy WFL for tasks assessed/performed             Past Medical History:  Diagnosis Date   Allergy    Asthma    Diabetes mellitus without complication ([place])    GERD (gastroesophageal reflux disease)    Hypertension    Palpitations    a. 10/2019: Monitor showing rare PVC's and no significant arrhythmias.    Past Surgical History:  Procedure Laterality Date   CATARACT EXTRACTION Bilateral    02/23/2022, 03/28/2022   ELBOW SURGERY     KNEE SURGERY Right    TONSILLECTOMY     Patient Active Problem List   Diagnosis Date Noted   Anemia 10/13/2022   Imbalance 01/12/2022   Type 2 diabetes mellitus with hyperglycemia ([place]) 09/30/2021   Knee pain, bilateral 09/30/2021   Leg edema 09/02/2021   Cellulitis of left upper extremity 06/27/2021   Persistent cough for 3 weeks or longer 05/20/2021   Sensation of fullness in both ears 05/20/2021   Lightheadedness 05/20/2021   Chronic right-sided thoracic back pain 09/06/2020   Leg cramping 03/19/2020   Urinary urgency 03/19/2020   Hyperlipidemia associated with type 2 diabetes mellitus ([place]) 09/13/2019   PVC's (premature ventricular contractions) 09/13/2019   Palpitations 09/08/2019   Presbycusis of both ears 06/22/2019   Tinnitus of both  ears 03/08/2019   Testosterone deficiency 07/15/2018   Left shoulder pain 01/21/2016   Medicare annual wellness visit, subsequent 10/07/2015   Insomnia, psychophysiological 10/07/2015   Encounter for general adult medical examination without abnormal findings 10/07/2015   Asthma 03/14/2015   Lumbar radiculopathy, chronic 09/22/2013   Hypertension associated with diabetes ([place]) 08/18/2013    ONSET DATE: Chronic  REFERRING DIAG: left shoulder pain  THERAPY DIAG:  Chronic left shoulder pain  Other symptoms and signs involving the musculoskeletal system  Rationale for Evaluation and Treatment: Rehabilitation  SUBJECTIVE:   SUBJECTIVE STATEMENT: S: When I'm active it doesn't bother me but now that it's cold I'm not as active.  Pt accompanied by: self  PERTINENT HISTORY: In 2001 pt reports he fell 8 feet onto a gym floor when painting a wall. He landed on his left side with a half gallon of paint in his hand. Pt began having shoulder pain a few months after that. Initially it was annoying but recently it's become more painful. Pt received a cortisone shot 09/2022 that has improved his pain but has a continual ache in his left shoulder.   PRECAUTIONS: None  WEIGHT BEARING RESTRICTIONS: No  PAIN:  Are you having pain? Yes: NPRS scale: 2/10 Pain location: left shoulder, radiates down the arm Pain description: aching pain Aggravating factors: cold, lack of  movement Relieving factors: unsure  FALLS: Has patient fallen in last 6 months? No  PLOF: Independent  PATIENT GOALS: To have less pain in his shoulder.   NEXT MD VISIT: not sure  OBJECTIVE:   HAND DOMINANCE: Right  ADLs: Overall ADLs: Pt reports he can do all of his ADLs but has pain with tasks. Pt reports he has the most pain when he is not doing anything, such as sitting on the couch or being sedentary. Also hurts when sitting and leaning on the left arm. Pt reports he has to sleep on his right side, has to have  support under the left arm.    FUNCTIONAL OUTCOME MEASURES: FOTO: 70/100  UPPER EXTREMITY ROM:     Active ROM Left eval  Shoulder flexion 148  Shoulder abduction 156  Shoulder internal rotation 90  Shoulder external rotation 55  (Blank rows = not tested)    UPPER EXTREMITY MMT:     MMT Left eval  Shoulder flexion 5/5  Shoulder abduction 5/5  Shoulder internal rotation 5/5  Shoulder external rotation 4+/5  (Blank rows = not tested)   COGNITION: Overall cognitive status: Within functional limits for tasks assessed  TODAY'S TREATMENT:                                                                                                                              DATE: N/A-eval only     PATIENT EDUCATION: Education details: shoulder A/ROM Person educated: Patient Education method: Consulting civil engineer, Media planner, and Handouts Education comprehension: verbalized understanding and returned demonstration  HOME EXERCISE PROGRAM: [place]: A/ROM  GOALS: Goals reviewed with patient? Yes  SHORT TERM GOALS: Target date: 12/05/22  Pt will be provided with and educated on HEP to improve mobility of LUE required for ADL completion.   Goal status: INITIAL  2.  Pt will decrease pain in LUE to 2/10 or less to improve ability to sleep in preferred position for 2+ consecutive hours without waking due to pain.   Goal status: INITIAL  3.  Pt will decrease LUE fascial restrictions to min amounts or less to improve mobility required for functional reaching tasks.   Goal status: INITIAL  4.  Pt will increase LUE A/ROM by 10 degrees or more to improve mobility required for reaching overhead and behind back during dressing and bathing tasks.    Goal status: INITIAL  5.  Pt will improve LUE stability to promote decreased pain during sedentary tasks.   Goal status: INITIAL    ASSESSMENT:  CLINICAL IMPRESSION: Patient is a 76 y.o. male who was seen today for occupational therapy  evaluation for chronic left shoulder pain. Pt reports his pain is worse when he is not using his arm and is sedentary. Also reports difficulty with a comfortable sleeping position. Pt with pain limiting functional use of LUE as non-dominant during ADLs and leisure tasks.     PERFORMANCE DEFICITS: in functional skills including ADLs, IADLs, ROM, strength,  pain, fascial restrictions, and UE functional use  IMPAIRMENTS: are limiting patient from ADLs, IADLs, rest and sleep, and leisure.   COMORBIDITIES: has no other co-morbidities that affects occupational performance. Patient will benefit from skilled OT to address above impairments and improve overall function.  MODIFICATION OR ASSISTANCE TO COMPLETE EVALUATION: No modification of tasks or assist necessary to complete an evaluation.  OT OCCUPATIONAL PROFILE AND HISTORY: Problem focused assessment: Including review of records relating to presenting problem.  CLINICAL DECISION MAKING: LOW - limited treatment options, no task modification necessary  REHAB POTENTIAL: Good  EVALUATION COMPLEXITY: Low      PLAN:  OT FREQUENCY: 1x/week  OT DURATION: 4 weeks  PLANNED INTERVENTIONS: self care/ADL training, therapeutic exercise, therapeutic activity, manual therapy, passive range of motion, electrical stimulation, ultrasound, and patient/family education  CONSULTED AND AGREED WITH PLAN OF CARE: Patient  PLAN FOR NEXT SESSION: Follow up on HEP, initiate theraband exercises and scapular strengthening   Ezra Sites, OTR/L  [phone] 11/05/2022, 1:41 PM

## 2022-11-05 NOTE — Patient Instructions (Signed)

## 2022-11-10 ENCOUNTER — Encounter (HOSPITAL_COMMUNITY): Payer: Medicare HMO | Admitting: Occupational Therapy

## 2022-11-10 NOTE — Therapy (Signed)
OUTPATIENT PHYSICAL THERAPY LOWER EXTREMITY EVALUATION   Patient Name: Brian Mullen MRN: [id number] DOB:1946-03-11, 76 y.o., male Today's Date: 11/11/2022  END OF SESSION:  PT End of Session - 11/11/22 0820     Visit Number 1    Number of Visits 6    Date for PT Re-Evaluation 12/23/22    Authorization Type [employer]    Authorization Time Period please check Berkley Harvey    PT Start Time 0821    PT Stop Time 0858    PT Time Calculation (min) 37 min    Activity Tolerance Patient tolerated treatment well    Behavior During Therapy [facility] for tasks assessed/performed             Past Medical History:  Diagnosis Date   Allergy    Asthma    Diabetes mellitus without complication (HCC)    GERD (gastroesophageal reflux disease)    Hypertension    Palpitations    a. 10/2019: Monitor showing rare PVC's and no significant arrhythmias.    Past Surgical History:  Procedure Laterality Date   CATARACT EXTRACTION Bilateral    02/23/2022, 03/28/2022   ELBOW SURGERY     KNEE SURGERY Right    TONSILLECTOMY     Patient Active Problem List   Diagnosis Date Noted   Anemia 10/13/2022   Imbalance 01/12/2022   Type 2 diabetes mellitus with hyperglycemia (HCC) 09/30/2021   Knee pain, bilateral 09/30/2021   Leg edema 09/02/2021   Cellulitis of left upper extremity 06/27/2021   Persistent cough for 3 weeks or longer 05/20/2021   Sensation of fullness in both ears 05/20/2021   Lightheadedness 05/20/2021   Chronic right-sided thoracic back pain 09/06/2020   Leg cramping 03/19/2020   Urinary urgency 03/19/2020   Hyperlipidemia associated with type 2 diabetes mellitus (HCC) 09/13/2019   PVC's (premature ventricular contractions) 09/13/2019   Palpitations 09/08/2019   Presbycusis of both ears 06/22/2019   Tinnitus of both ears 03/08/2019   Testosterone deficiency 07/15/2018   Left shoulder pain 01/21/2016   Medicare annual wellness visit, subsequent 10/07/2015   Insomnia, psychophysiological  10/07/2015   Encounter for general adult medical examination without abnormal findings 10/07/2015   Asthma 03/14/2015   Lumbar radiculopathy, chronic 09/22/2013   Hypertension associated with diabetes (HCC) 08/18/2013    PCP: Doreene Nest, NP   REFERRING PROVIDER:   Doreene Nest, NP    REFERRING DIAG: M25.561,M25.562,G89.29 (ICD-10-CM) - Chronic pain of both knees M25.512,G89.29 (ICD-10-CM) - Chronic left shoulder pain  THERAPY DIAG:  Other symptoms and signs involving the musculoskeletal system  Difficulty in walking, not elsewhere classified  Chronic pain of both knees  Rationale for Evaluation and Treatment: Rehabilitation  ONSET DATE: 2001  SUBJECTIVE:   SUBJECTIVE STATEMENT: Had a fall 2001; painting a wall; on a staircase type ladder; fell about 8 feet.  Injured both knees and left shoulder (seeing OT for left shoulder) has had therapy before and injections; currently his chief complaints are knee pain; left knee gives way more than right; difficulty walking or standing for a prolonged period of time.   PERTINENT HISTORY: Had surgery left knee many years before fall PAIN:  Are you having pain? Yes: NPRS scale: 2/10 Pain location: both knees Pain description: aching and stiff Aggravating factors: standing and walking for a long period of time Relieving factors: rest and elevate, ice  PRECAUTIONS: None  WEIGHT BEARING RESTRICTIONS: No  FALLS:  Has patient fallen in last 6 months? No  LIVING ENVIRONMENT: Lives with:  lives with their spouse Lives in: House/apartment Stairs: Yes: External: 4 steps; on left going up Has following equipment at home: Single point cane and shower chair  OCCUPATION: handyman, carpentry type things  PLOF: Independent  PATIENT GOALS: get my knees stronger  NEXT MD VISIT: unsure but sees PCP regularly  OBJECTIVE:   DIAGNOSTIC FINDINGS: x-rays in 2020 moderate to severe arthritis both knees  PATIENT SURVEYS:   FOTO 55  COGNITION: Overall cognitive status: Within functional limits for tasks assessed     SENSATION: WFL  EDEMA:  None noted   POSTURE:  wide bos  PALPATION: Tenderness right knee lateral to patella  LOWER EXTREMITY ROM:  Active ROM Right eval Left eval  Hip flexion    Hip extension    Hip abduction    Hip adduction    Hip internal rotation    Hip external rotation    Knee flexion 115 114  Knee extension -8 -4  Ankle dorsiflexion    Ankle plantarflexion    Ankle inversion    Ankle eversion     (Blank rows = not tested)  LOWER EXTREMITY MMT:  MMT Right eval Left eval  Hip flexion 4+ 4+  Hip extension    Hip abduction    Hip adduction    Hip internal rotation    Hip external rotation    Knee flexion    Knee extension 4+ 4  Ankle dorsiflexion 5 4+  Ankle plantarflexion    Ankle inversion    Ankle eversion     (Blank rows = not tested)  FUNCTIONAL TESTS:  5 times sit to stand: 27.28 sec using hands on thighs to push up to standing 2 minute walk test: next visit  GAIT: Distance walked: 50 ft Assistive device utilized: None Level of assistance: Modified independence Comments: wide BOS; slow antalgic gait speed   TODAY'S TREATMENT:                                                                                                                              DATE: physical therapy evaluation and HEP instruction    PATIENT EDUCATION:  Education details: Patient educated on exam findings, POC, scope of PT, HEP. Person educated: Patient Education method: Explanation, Demonstration, and Handouts Education comprehension: verbalized understanding, returned demonstration, verbal cues required, and tactile cues required  HOME EXERCISE PROGRAM: Access Code: [email or web address] URL: [email or web address] Date: 11/11/2022 Prepared by: AP - Rehab  Exercises - Supine Quad Set  - 1 x daily - 7 x weekly - 3 sets - 10 reps - Supine Heel Slide  - 1 x  daily - 7 x weekly - 3 sets - 10 reps  ASSESSMENT:  CLINICAL IMPRESSION:  Patient is a 76 y.o. male who presents to physical therapy with complaint of chronic bilateral knee pain. Patient demonstrates muscle weakness, reduced ROM, and fascial restrictions which are likely contributing to symptoms of pain and are negatively impacting patient ability to perform ADLs  and functional mobility tasks. Patient will benefit from skilled physical therapy services to address these deficits to reduce pain and improve level of function with ADLs and functional mobility tasks.   OBJECTIVE IMPAIRMENTS: Abnormal gait, decreased activity tolerance, decreased balance, decreased endurance, decreased knowledge of use of DME, decreased mobility, difficulty walking, decreased ROM, decreased strength, hypomobility, increased fascial restrictions, impaired perceived functional ability, increased muscle spasms, impaired flexibility, improper body mechanics, and pain.   ACTIVITY LIMITATIONS: carrying, lifting, bending, sitting, standing, squatting, sleeping, stairs, transfers, bed mobility, locomotion level, and caring for others  PARTICIPATION LIMITATIONS: meal prep, cleaning, laundry, shopping, community activity, occupation, and yard work  PERSONAL FACTORS: Time since onset of injury/illness/exacerbation are also affecting patient's functional outcome.   REHAB POTENTIAL: Good  CLINICAL DECISION MAKING: Stable/uncomplicated  EVALUATION COMPLEXITY: Low   GOALS: Goals reviewed with patient? No  SHORT TERM GOALS: Target date: 12/02/2021 patient will be independent with initial HEP  Baseline: Goal status: INITIAL  2.  Patient will improve bilateral knee extension to -2 Baseline: see above Goal status: INITIAL    LONG TERM GOALS: Target date: 12/23/2021  Patient will be independent in self management strategies to improve quality of life and functional outcomes.   Baseline:  Goal status: INITIAL  2.   Patient will improve FOTO score to predicted value  Baseline: 55 Goal status: INITIAL  3.  Patient will increase  leg MMTs to 5/5 without pain to promote return to ambulation community distances with minimal deviation.  Baseline: see above Goal status: INITIAL  4.   Patient will report at least 50% improvement in overall symptoms and/or function to demonstrate improved functional mobility   Baseline:  Goal status: INITIAL  5.  Patient will improve 5 times sit to stand score from 27.28 sec to 22 sec to demonstrate improved functional mobility and increased lower extremity strength.  Baseline: 27.28 sec Goal status: INITIAL   PLAN:  PT FREQUENCY: 1x/week  PT DURATION: 6 weeks  PLANNED INTERVENTIONS: Therapeutic exercises, Therapeutic activity, Neuromuscular re-education, Balance training, Gait training, Patient/Family education, Joint manipulation, Joint mobilization, Stair training, Orthotic/Fit training, DME instructions, Aquatic Therapy, Dry Needling, Electrical stimulation, Spinal manipulation, Spinal mobilization, Cryotherapy, Moist heat, Compression bandaging, scar mobilization, Splintting, Taping, Traction, Ultrasound, Ionotophoresis 4mg /ml Dexamethasone, and Manual therapy   PLAN FOR NEXT SESSION: Review HEP and goals; 2 MWT; progress knee mobility and strength as able   9:54 AM, 11/11/22 Kilie Rund Small Chinenye Katzenberger MPT [facility] [place] [phone] Ph:[phone]

## 2022-11-11 ENCOUNTER — Ambulatory Visit (HOSPITAL_COMMUNITY): Payer: Medicare HMO

## 2022-11-11 DIAGNOSIS — R262 Difficulty in walking, not elsewhere classified: Secondary | ICD-10-CM

## 2022-11-11 DIAGNOSIS — R29898 Other symptoms and signs involving the musculoskeletal system: Secondary | ICD-10-CM | POA: Diagnosis not present

## 2022-11-11 DIAGNOSIS — M25562 Pain in left knee: Secondary | ICD-10-CM | POA: Diagnosis not present

## 2022-11-11 DIAGNOSIS — G8929 Other chronic pain: Secondary | ICD-10-CM | POA: Diagnosis not present

## 2022-11-11 DIAGNOSIS — M25512 Pain in left shoulder: Secondary | ICD-10-CM | POA: Diagnosis not present

## 2022-11-11 DIAGNOSIS — M25561 Pain in right knee: Secondary | ICD-10-CM | POA: Diagnosis not present

## 2022-11-18 ENCOUNTER — Encounter: Payer: Self-pay | Admitting: Neurology

## 2022-11-18 ENCOUNTER — Ambulatory Visit (INDEPENDENT_AMBULATORY_CARE_PROVIDER_SITE_OTHER): Payer: Medicare HMO | Admitting: Neurology

## 2022-11-18 VITALS — BP 136/71 | HR 72 | Ht 69.0 in | Wt 199.6 lb

## 2022-11-18 DIAGNOSIS — R42 Dizziness and giddiness: Secondary | ICD-10-CM

## 2022-11-18 NOTE — Progress Notes (Signed)
NEUROLOGY CONSULTATION NOTE  Leam Madero MRN: [id number] DOB: [birth year]  Referring provider: Alma Friendly, NP Primary care provider: Alma Friendly, NP  Reason for consult:  dizziness/lightheadedness  Thank you for your kind referral of Abanoub Hanken for consultation of the above symptoms. Although his history is well known to you, please allow me to reiterate it for the purpose of our medical record. The patient was accompanied to the clinic by his wife who also provides collateral information. Records and images were personally reviewed where available.   HISTORY OF PRESENT ILLNESS: This is a pleasant 77 year old right-handed man with a history of hypertension, DM, presenting for evaluation of dizziness/lightheadedness. He reports symptoms gradually started off and on a year ago. He denies any spinning sensation, sometimes it feels like he will pass out or his balance is affected that he has to grab on to something. They mostly occur when he is standing and walking, and would depend on his activity. He states they mostly occur when he gets up and turns at the same time. He did not have any symptoms bending down to put shoe on today. He feels usually both motions of coming up and turning would trigger symptoms. If he is sitting and reaches down to pick up the dog, then moves too fast or turns the wrong way, he would feel the dizziness. His wife notes when it happens when he is sitting, she would check his blood pressure and it would be high. The symptoms last for a few seconds, no associated nausea/vomiting. No double vision but sometimes when walking (especially into a store), he has to wear sunglasses and take his eyeglasses off. He thinks it has something to do with the lighting. When he is walking, he gets a little disoriented and wobbly. No falls or loss of consciousness with these. He recalls one syncopal episode in 2018 or 2019, he went to the bathroom, sat on the commode, then woke up  on the floor with no warning symptoms. One time, he was making toast, turned, then had to grab on to something. Initially they felt it was related to his vision, but despite surgery on both eyes, there has been no improvement in symptoms. Since his eye surgery in 12/2021, he has had headaches on the left temple region. When he is exposed to the sun, he starts getting a headache, but they also occur while he is in bed. Headaches last for hours. Aspirin does not help. There is no nausea/vomiting with the headaches. No prior history of migraines, but recalls that in his 41s, he would have spells where all of a sudden it was like someone whacked him in the head with blinding pain for a few seconds. He was diagnosed with a "chemical imbalance," treated with "oxytol(?sp)" and these resolved. He has some neck tightness and back pain from disc compression. He denies any difficulty swallowing. His toes feel numb and cold. No bowel/bladder dysfunction. His wife denies any staring/unresponsive episodes. His short-term memory is "terrible." Glucose at home runs between 183-203. Last HbA1c was 7.6 in 09/2022.  He was evaluated by [facility] in 12/2021 for these symptoms, felt probably multifactorial with a component of orthostatic changes and possibly even cerebellar dysfunction related to long-standing diabetes. Consideration for balance rehab was recommended. Currently he is doing PT and OT for left shoulder and bilateral knee pain.   I personally reviewed head CT without contrast 12/2021 which did not show any acute changes.   PAST MEDICAL  HISTORY: Past Medical History:  Diagnosis Date   Allergy    Asthma    Diabetes mellitus without complication (HCC)    GERD (gastroesophageal reflux disease)    Hypertension    Palpitations    a. 10/2019: Monitor showing rare PVC's and no significant arrhythmias.     PAST SURGICAL HISTORY: Past Surgical History:  Procedure Laterality Date   CATARACT EXTRACTION  Bilateral    02/23/2022, 03/28/2022   ELBOW SURGERY     KNEE SURGERY Right    TONSILLECTOMY      MEDICATIONS: Current Outpatient Medications on File Prior to Visit  Medication Sig Dispense Refill   ACCU-CHEK AVIVA PLUS test strip USE AS INSTRUCTED TO TEST BLOOD SUGAR 2 TIMES DAILY 200 strip 0   albuterol (VENTOLIN HFA) 108 (90 Base) MCG/ACT inhaler INHALE 1 TO 2 PUFFS EVERY 4 TO 6 HOURS AS NEEDED 1 each 0   Alcohol Swabs (B-D SINGLE USE SWABS REGULAR) PADS Use to check blood sugars. 200 each 3   Blood Glucose Monitoring Suppl (ONE TOUCH ULTRA 2) w/Device KIT Use as instructed to blood sugar 2 times daily. 1 each 0   fexofenadine (ALLEGRA) 180 MG tablet Take 180 mg by mouth daily.     fluticasone (FLONASE) 50 MCG/ACT nasal spray PLACE 1 SPRAY INTO BOTH NOSTRILS 2   TIMES DAILY. 48 g 1   Lancets (ONETOUCH ULTRASOFT) lancets Use as instructed to test blood sugar 2 times daily. 100 each 5   latanoprost (XALATAN) 0.005 % ophthalmic solution SMARTSIG:1 Drop(s) In Eye(s) Every Evening     metFORMIN (GLUCOPHAGE) 500 MG tablet TAKE 2 TABLETS (1,000 MG TOTAL) BY MOUTH 2 (TWO) TIMES DAILY WITH A MEAL FOR DIABETES 360 tablet 1   metoprolol succinate (TOPROL-XL) 25 MG 24 hr tablet Take 1 tablet (25 mg total) by mouth daily. for high blood pressure 90 tablet 3   montelukast (SINGULAIR) 10 MG tablet TAKE 1 TABLET EVERY DAY AT BEDTIME FOR ALLERGIES/ASTHMA 90 tablet 0   omeprazole (PRILOSEC) 20 MG capsule Take 40 mg by mouth daily.     tamsulosin (FLOMAX) 0.4 MG CAPS capsule Take 1 capsule (0.4 mg total) by mouth in the morning and at bedtime. 180 capsule 0   tiotropium (SPIRIVA HANDIHALER) 18 MCG inhalation capsule Place 1 capsule (18 mcg total) into inhaler and inhale daily. 30 capsule 2   tiZANidine (ZANAFLEX) 4 MG tablet TAKE 1 TABLET AT BEDTIME AS NEEDED FOR MUSCLE SPASM(S) 30 tablet 0   No current facility-administered medications on file prior to visit.    ALLERGIES: Allergies  Allergen Reactions    Coconut (Cocos Nucifera) Anaphylaxis   Iodine Anaphylaxis   Shellfish Allergy Anaphylaxis   Shellfish-Derived Products Anaphylaxis   Amlodipine Cough    LE Swelling    FAMILY HISTORY: Family History  Problem Relation Age of Onset   Pulmonary embolism Father 28       Deceased   Heart attack Mother 19       Deceased   Diabetes Maternal Grandmother    Heart attack Sister     SOCIAL HISTORY: Social History   Socioeconomic History   Marital status: Widowed    Spouse name: Not on file   Number of children: Not on file   Years of education: Not on file   Highest education level: Not on file  Occupational History   Not on file  Tobacco Use   Smoking status: Former    Packs/day: 4.00    Years: 12.00  Total pack years: 48.00    Types: Cigarettes    Quit date: 22    Years since quitting: 48.0   Smokeless tobacco: Never   Tobacco comments:    used to smoke Lucky's  Vaping Use   Vaping Use: Never used  Substance and Sexual Activity   Alcohol use: Yes    Alcohol/week: 1.0 - 2.0 standard drink of alcohol    Types: 1 - 2 Standard drinks or equivalent per week    Comment: occ   Drug use: No   Sexual activity: Not on file  Other Topics Concern   Not on file  Social History Narrative   Married.   No children   Retried. Worked as a Furniture conservator/restorer.   Enjoys working on [employer], yard work, gardening.   Right handed    Social Determinants of Health   Financial Resource Strain: Low Risk  (09/29/2022)   Overall Financial Resource Strain (CARDIA)    Difficulty of Paying Living Expenses: Not hard at all  Food Insecurity: No Food Insecurity (09/29/2022)   Hunger Vital Sign    Worried About Running Out of Food in the Last Year: Never true    Ran Out of Food in the Last Year: Never true  Transportation Needs: No Transportation Needs (09/29/2022)   PRAPARE - Hydrologist (Medical): No    Lack of Transportation (Non-Medical): No   Physical Activity: Inactive (09/29/2022)   Exercise Vital Sign    Days of Exercise per Week: 0 days    Minutes of Exercise per Session: 0 min  Stress: No Stress Concern Present (09/29/2022)   [place]    Feeling of Stress : Not at all  Social Connections: Not on file  Intimate Partner Violence: Not on file     PHYSICAL EXAM: Vitals:   11/18/22 1018  BP: 136/71  Pulse: 72  SpO2: 98%   Orthostatic Vital Signs: Prone BP 140/62  HR 72 Sitting BP 120/58  HR 72 Standing BP 118/60  HR 77  General: No acute distress Head:  Normocephalic/atraumatic Skin/Extremities: No rash, no edema. Both knees in knee braces Neurological Exam: Mental status: alert and oriented to person, place, and time, no dysarthria or aphasia, Fund of knowledge is appropriate.  Recent and remote memory are intact.  Attention and concentration are normal.     Cranial nerves: CN I: not tested CN II: pupils equal, round, visual fields intact CN III, IV, VI:  full range of motion, no nystagmus, no ptosis CN V: facial sensation intact CN VII: upper and lower face symmetric CN VIII: hearing intact to conversation Bulk & Tone: normal, no fasciculations, no cogwheeling Motor: 5/5 throughout with no pronator drift. Sensation: intact to light touch, cold, pin. Decreased vibration sense to ankles bilaterally.  No extinction to double simultaneous stimulation.  Romberg test negative Deep Tendon Reflexes: unable to elicit reflexes throughout Plantar responses: downgoing bilaterally Cerebellar: no incoordination on finger to nose testing Gait: slow and cautious reporting knee pain, poor clearance of both feet Tremor: none Negative pull test   IMPRESSION: This is a pleasant 77 year old right-handed man with a history of hypertension, DM, presenting for evaluation of dizziness/lightheadedness. His neurological exam shows mild neuropathy, no parkinsonian  signs. There is note of a 20-point drop in BP from supine to sitting. The dizziness/lightheadedness he describes are quite positional (motions of coming up and turning at the same time  would trigger symptoms). We discussed doing a brain MRI with and without contrast, MRA head without contrast, and MRA neck with and without contrast to assess for flow-limiting stenosis such as vertebrobasilar insufficiency. He was advised to increase hydration. Agree with ENT for referral for Balance rehab, gaze stabilization exercises. Our office will call with MRI/MRA results and next steps.    Thank you for allowing me to participate in the care of this patient. Please do not hesitate to call for any questions or concerns.   Ellouise Newer, M.D.  CC: Malena Edman, NP

## 2022-11-18 NOTE — Patient Instructions (Addendum)
Good to meet you.  Schedule open MRI brain with and without contrast, MRA head without contrast, MRA neck with and without contrast  2. Referral will be sent to [facility] for Vestibular therapy and gaze stabilization exercises  3. Increase water intake (8 glasses daily)  4. Our office will call with results and next steps, call for any changes

## 2022-11-19 ENCOUNTER — Encounter (HOSPITAL_COMMUNITY): Payer: Self-pay | Admitting: Occupational Therapy

## 2022-11-19 ENCOUNTER — Ambulatory Visit (HOSPITAL_COMMUNITY): Payer: Medicare HMO | Attending: Internal Medicine | Admitting: Occupational Therapy

## 2022-11-19 DIAGNOSIS — R29898 Other symptoms and signs involving the musculoskeletal system: Secondary | ICD-10-CM | POA: Diagnosis not present

## 2022-11-19 DIAGNOSIS — M25512 Pain in left shoulder: Secondary | ICD-10-CM | POA: Insufficient documentation

## 2022-11-19 DIAGNOSIS — R262 Difficulty in walking, not elsewhere classified: Secondary | ICD-10-CM | POA: Diagnosis not present

## 2022-11-19 DIAGNOSIS — M25562 Pain in left knee: Secondary | ICD-10-CM | POA: Insufficient documentation

## 2022-11-19 DIAGNOSIS — M25561 Pain in right knee: Secondary | ICD-10-CM | POA: Insufficient documentation

## 2022-11-19 DIAGNOSIS — G8929 Other chronic pain: Secondary | ICD-10-CM | POA: Insufficient documentation

## 2022-11-19 NOTE — Therapy (Signed)
OUTPATIENT OCCUPATIONAL THERAPY ORTHO TREATMENT  Patient Name: Brian Mullen MRN: [id number] DOB:Apr 25, 1946, 77 y.o., male Today's Date: 11/19/2022  PCP: [name], NP REFERRING PROVIDER: [name], NP  END OF SESSION:  OT End of Session - 11/19/22 1253     Visit Number 2    Number of Visits 4    Date for OT Re-Evaluation 12/05/22    Authorization Type [employer]    Authorization Time Period 4 Visits approved 12/22-1/20/24    Authorization - Visit Number 1    Authorization - Number of Visits 4    Progress Note Due on Visit 10    OT Start Time 1301    OT Stop Time 1342    OT Time Calculation (min) 41 min    Activity Tolerance Patient tolerated treatment well    Behavior During Therapy [facility] for tasks assessed/performed             Past Medical History:  Diagnosis Date   Allergy    Asthma    Diabetes mellitus without complication (HCC)    GERD (gastroesophageal reflux disease)    Hypertension    Palpitations    a. 10/2019: Monitor showing rare PVC's and no significant arrhythmias.    Past Surgical History:  Procedure Laterality Date   CATARACT EXTRACTION Bilateral    02/23/2022, 03/28/2022   ELBOW SURGERY     KNEE SURGERY Right    TONSILLECTOMY     Patient Active Problem List   Diagnosis Date Noted   Anemia 10/13/2022   Imbalance 01/12/2022   Type 2 diabetes mellitus with hyperglycemia (HCC) 09/30/2021   Knee pain, bilateral 09/30/2021   Leg edema 09/02/2021   Cellulitis of left upper extremity 06/27/2021   Persistent cough for 3 weeks or longer 05/20/2021   Sensation of fullness in both ears 05/20/2021   Lightheadedness 05/20/2021   Chronic right-sided thoracic back pain 09/06/2020   Leg cramping 03/19/2020   Urinary urgency 03/19/2020   Hyperlipidemia associated with type 2 diabetes mellitus (HCC) 09/13/2019   PVC's (premature ventricular contractions) 09/13/2019   Palpitations 09/08/2019   Presbycusis of both ears 06/22/2019   Tinnitus  of both ears 03/08/2019   Testosterone deficiency 07/15/2018   Left shoulder pain 01/21/2016   Medicare annual wellness visit, subsequent 10/07/2015   Insomnia, psychophysiological 10/07/2015   Encounter for general adult medical examination without abnormal findings 10/07/2015   Asthma 03/14/2015   Lumbar radiculopathy, chronic 09/22/2013   Hypertension associated with diabetes (HCC) 08/18/2013    ONSET DATE: Chronic  REFERRING DIAG: left shoulder pain  THERAPY DIAG:  Other symptoms and signs involving the musculoskeletal system  Chronic left shoulder pain  Rationale for Evaluation and Treatment: Rehabilitation  SUBJECTIVE:   SUBJECTIVE STATEMENT: S: "I couldn't move my arm for 2 days after I started doing the exercises."   PERTINENT HISTORY: In 2001 pt reports he fell 8 feet onto a gym floor when painting a wall. He landed on his left side with a half gallon of paint in his hand. Pt began having shoulder pain a few months after that. Initially it was annoying but recently it's become more painful. Pt received a cortisone shot 09/2022 that has improved his pain but has a continual ache in his left shoulder.   PRECAUTIONS: None  WEIGHT BEARING RESTRICTIONS: No  PAIN:  Are you having pain? Yes: NPRS scale: 4/10 Pain location: left shoulder, radiates down the arm Pain description: mild discomfort Aggravating factors: cold, lack of movement, too much exercise Relieving  factors: rest  FALLS: Has patient fallen in last 6 months? No  PATIENT GOALS: To have less pain in his shoulder.   NEXT MD VISIT: not sure  OBJECTIVE:   HAND DOMINANCE: Right  ADLs: Overall ADLs: Pt reports he can do all of his ADLs but has pain with tasks. Pt reports he has the most pain when he is not doing anything, such as sitting on the couch or being sedentary. Also hurts when sitting and leaning on the left arm. Pt reports he has to sleep on his right side, has to have support under the left arm.     FUNCTIONAL OUTCOME MEASURES: FOTO: 70/100  UPPER EXTREMITY ROM:     Active ROM Left eval  Shoulder flexion 148  Shoulder abduction 156  Shoulder internal rotation 90  Shoulder external rotation 55  (Blank rows = not tested)    UPPER EXTREMITY MMT:     MMT Left eval  Shoulder flexion 5/5  Shoulder abduction 5/5  Shoulder internal rotation 5/5  Shoulder external rotation 4+/5  (Blank rows = not tested)    TODAY'S TREATMENT:                                                                                                                              DATE:   11/19/22 -Myofascial release to left upper arm, trapezius, and scapular regions to decrease pain and fascial restrictions and increase joint ROM.  -P/ROM: supine-flexion, abduction, er/IR, horizontal abduction -A/ROM: supine-protraction, flexion, abduction, er/IR, horizontal abduction, 10 reps -Proximal shoulder strengthening: supine-paddles, criss cross, circles each direction, 10 reps each -A/ROM: seated-protraction, flexion, abduction, er/IR, horizontal abduction, 10 reps -Scapular strengthening: seated-red theraband-row, extension, retraction, 10 reps each -UBE: level 1, 3' forward 3' reverse, pace: 5.0  PATIENT EDUCATION: Education details: discussed HEP frequency, trialing in supine versus standing Person educated: Patient Education method: Explanation, Demonstration, and Handouts Education comprehension: verbalized understanding and returned demonstration  HOME EXERCISE PROGRAM: Eval: A/ROM  GOALS: Goals reviewed with patient? Yes  SHORT TERM GOALS: Target date: 12/05/22  Pt will be provided with and educated on HEP to improve mobility of LUE required for ADL completion.   Goal status: IN PROGRESS  2.  Pt will decrease pain in LUE to 2/10 or less to improve ability to sleep in preferred position for 2+ consecutive hours without waking due to pain.   Goal status: IN PROGRESS  3.  Pt will decrease  LUE fascial restrictions to min amounts or less to improve mobility required for functional reaching tasks.   Goal status: IN PROGRESS  4.  Pt will increase LUE A/ROM by 10 degrees or more to improve mobility required for reaching overhead and behind back during dressing and bathing tasks.    Goal status: IN PROGRESS  5.  Pt will improve LUE stability to promote decreased pain during sedentary tasks.   Goal status: IN PROGRESS    ASSESSMENT:  CLINICAL IMPRESSION: Pt reports significant  pain in his shoulder after completing HEP for 2-3 days. Pt is completing 10x each 2x per day. Discussed decreasing to 1x/day or trialing in supine versus standing. Initiated myofascial release, palpated moderate muscle knot in upper trapezius. P/ROM completed and pt with ROM WNL. Completed A/ROM in supine, pt reports much easier than in standing, minor stretching/discomfort with abduction. Transitioned to seated, pt reports requires a little more effort than when in supine. Pt completing scapular strengthening using red theraband, also completing UBE at end of session. Verbal cuing for form and technique throughout tasks. Verbalized understanding of adjusting HEP to supine position.    PERFORMANCE DEFICITS: in functional skills including ADLs, IADLs, ROM, strength, pain, fascial restrictions, and UE functional use    PLAN:  OT FREQUENCY: 1x/week  OT DURATION: 4 weeks  PLANNED INTERVENTIONS: self care/ADL training, therapeutic exercise, therapeutic activity, manual therapy, passive range of motion, electrical stimulation, ultrasound, and patient/family education  CONSULTED AND AGREED WITH PLAN OF CARE: Patient  PLAN FOR NEXT SESSION: Follow up on HEP, continue with theraband exercises and scapular strengthening   Guadelupe Sabin, OTR/L  [phone] 11/19/2022, 1:42 PM

## 2022-11-24 ENCOUNTER — Encounter (HOSPITAL_COMMUNITY): Payer: Self-pay | Admitting: Occupational Therapy

## 2022-11-24 ENCOUNTER — Ambulatory Visit (HOSPITAL_COMMUNITY): Payer: Medicare HMO | Admitting: Physical Therapy

## 2022-11-24 ENCOUNTER — Ambulatory Visit (HOSPITAL_COMMUNITY): Payer: Medicare HMO | Admitting: Occupational Therapy

## 2022-11-24 DIAGNOSIS — G8929 Other chronic pain: Secondary | ICD-10-CM

## 2022-11-24 DIAGNOSIS — R29898 Other symptoms and signs involving the musculoskeletal system: Secondary | ICD-10-CM | POA: Diagnosis not present

## 2022-11-24 DIAGNOSIS — R262 Difficulty in walking, not elsewhere classified: Secondary | ICD-10-CM

## 2022-11-24 DIAGNOSIS — M25561 Pain in right knee: Secondary | ICD-10-CM | POA: Diagnosis not present

## 2022-11-24 DIAGNOSIS — M25512 Pain in left shoulder: Secondary | ICD-10-CM | POA: Diagnosis not present

## 2022-11-24 DIAGNOSIS — M25562 Pain in left knee: Secondary | ICD-10-CM | POA: Diagnosis not present

## 2022-11-24 NOTE — Therapy (Signed)
OUTPATIENT PHYSICAL THERAPY TREATMENT Patient Name: Brian Mullen MRN: [id number] DOB:10/12/1946, 77 y.o., male Today's Date: 11/24/2022  END OF SESSION:  PT End of Session - 11/24/22 0823     Visit Number 2    Number of Visits 6    Date for PT Re-Evaluation 12/23/22    Authorization Type [employer]    Authorization Time Period cohere approved 6 visits from 12/27-2/07    Authorization - Visit Number 2    Authorization - Number of Visits 6    Progress Note Due on Visit 6    PT Start Time 0820    PT Stop Time 0900    PT Time Calculation (min) 40 min    Activity Tolerance Patient tolerated treatment well    Behavior During Therapy [facility] for tasks assessed/performed             Past Medical History:  Diagnosis Date   Allergy    Asthma    Diabetes mellitus without complication (HCC)    GERD (gastroesophageal reflux disease)    Hypertension    Palpitations    a. 10/2019: Monitor showing rare PVC's and no significant arrhythmias.    Past Surgical History:  Procedure Laterality Date   CATARACT EXTRACTION Bilateral    02/23/2022, 03/28/2022   ELBOW SURGERY     KNEE SURGERY Right    TONSILLECTOMY     Patient Active Problem List   Diagnosis Date Noted   Anemia 10/13/2022   Imbalance 01/12/2022   Type 2 diabetes mellitus with hyperglycemia (HCC) 09/30/2021   Knee pain, bilateral 09/30/2021   Leg edema 09/02/2021   Cellulitis of left upper extremity 06/27/2021   Persistent cough for 3 weeks or longer 05/20/2021   Sensation of fullness in both ears 05/20/2021   Lightheadedness 05/20/2021   Chronic right-sided thoracic back pain 09/06/2020   Leg cramping 03/19/2020   Urinary urgency 03/19/2020   Hyperlipidemia associated with type 2 diabetes mellitus (HCC) 09/13/2019   PVC's (premature ventricular contractions) 09/13/2019   Palpitations 09/08/2019   Presbycusis of both ears 06/22/2019   Tinnitus of both ears 03/08/2019   Testosterone deficiency 07/15/2018   Left shoulder pain  01/21/2016   Medicare annual wellness visit, subsequent 10/07/2015   Insomnia, psychophysiological 10/07/2015   Encounter for general adult medical examination without abnormal findings 10/07/2015   Asthma 03/14/2015   Lumbar radiculopathy, chronic 09/22/2013   Hypertension associated with diabetes (HCC) 08/18/2013    PCP: Doreene Nest, NP   REFERRING PROVIDER:   Doreene Nest, NP    REFERRING DIAG: M25.561,M25.562,G89.29 (ICD-10-CM) - Chronic pain of both knees M25.512,G89.29 (ICD-10-CM) - Chronic left shoulder pain  THERAPY DIAG:  Chronic pain of both knees  Difficulty in walking, not elsewhere classified  Rationale for Evaluation and Treatment: Rehabilitation  ONSET DATE: 2001  SUBJECTIVE:   SUBJECTIVE STATEMENT: Pt states his aching is bad today in his knees, Lt is worse than Rt.  States he is doing his arm exercises but the leg exercises "aren't good" States the quad sets cause made his hips hurt and could hardly walk.   Evaluation:  Had a fall 2001; painting a wall; on a staircase type ladder; fell about 8 feet.  Injured both knees and left shoulder (seeing OT for left shoulder) has had therapy before and injections; currently his chief complaints are knee pain; left knee gives way more than right; difficulty walking or standing for a prolonged period of time.   PERTINENT HISTORY: Had surgery left knee many years  before fall PAIN:  Are you having pain? Yes: NPRS scale: 2/10 Pain location: both knees Pain description: aching and stiff Aggravating factors: standing and walking for a long period of time Relieving factors: rest and elevate, ice  PRECAUTIONS: None  WEIGHT BEARING RESTRICTIONS: No  FALLS:  Has patient fallen in last 6 months? No  LIVING ENVIRONMENT: Lives with: lives with their spouse Lives in: House/apartment Stairs: Yes: External: 4 steps; on left going up Has following equipment at home: Single point cane and shower  chair  OCCUPATION: handyman, carpentry type things  PLOF: Independent  PATIENT GOALS: get my knees stronger  NEXT MD VISIT: unsure but sees PCP regularly  OBJECTIVE:   DIAGNOSTIC FINDINGS: x-rays in 2020 moderate to severe arthritis both knees  PATIENT SURVEYS:  FOTO 55  COGNITION: Overall cognitive status: Within functional limits for tasks assessed     SENSATION: WFL  EDEMA:  None noted   POSTURE:  wide bos  PALPATION: Tenderness right knee lateral to patella  LOWER EXTREMITY ROM:  Active ROM Right eval Left eval  Hip flexion    Hip extension    Hip abduction    Hip adduction    Hip internal rotation    Hip external rotation    Knee flexion 115 114  Knee extension -8 -4  Ankle dorsiflexion    Ankle plantarflexion    Ankle inversion    Ankle eversion     (Blank rows = not tested)  LOWER EXTREMITY MMT:  MMT Right eval Left eval  Hip flexion 4+ 4+  Hip extension    Hip abduction    Hip adduction    Hip internal rotation    Hip external rotation    Knee flexion    Knee extension 4+ 4  Ankle dorsiflexion 5 4+  Ankle plantarflexion    Ankle inversion    Ankle eversion     (Blank rows = not tested)  FUNCTIONAL TESTS:  5 times sit to stand: 27.28 sec using hands on thighs to push up to standing 2 minute walk test: 1/9: 226 feet with SPC  GAIT: Distance walked: 50 ft Assistive device utilized: None Level of assistance: Modified independence Comments: wide BOS; slow antalgic gait speed   TODAY'S TREATMENT:                                                                                                                              DATE:  11/24/22 Goal review 2MWT 226 feet with SPC Seated: LAQ 10X each   Sit to stands no UE 2X5 Supine:  HEP review QS, heelslides 10X each  Bridge 2X5  Evaluation:  physical therapy evaluation and HEP instruction    PATIENT EDUCATION:  Education details: Patient educated on exam findings, POC, scope of  PT, HEP. Person educated: Patient Education method: Explanation, Demonstration, and Handouts Education comprehension: verbalized understanding, returned demonstration, verbal cues required, and tactile cues required  HOME EXERCISE PROGRAM: Access Code:  [email or web address] URL: [email or web address] Date: 11/11/2022 Prepared by: [facility] - Rehab  Exercises - Supine Quad Set  - 1 x daily - 7 x weekly - 3 sets - 10 reps - Supine Heel Slide  - 1 x daily - 7 x weekly - 3 sets - 10 reps  ASSESSMENT:  CLINICAL IMPRESSION:  Reviewed goals and POC moving forward.  Pt asking questions about going to gym using LE machines, educated not to do this as quad sets are causing restriction of function due to pain.  Pt also using his SPC on wrong side and instructed on how to properly use this and reduce Lt shoulder discomfort.  Pt pleased with difference and stress taken off his Lt shoulder joint. Educated on difference between pain and soreness from new activities. Pt required 3 attempts to complete all 10 reps of bridge due to having cramps.  Instructed to continue HEP issued and only complete one set 2X day.  Patient will benefit from skilled physical therapy services to address these deficits to reduce pain and improve level of function with ADLs and functional mobility tasks.   OBJECTIVE IMPAIRMENTS: Abnormal gait, decreased activity tolerance, decreased balance, decreased endurance, decreased knowledge of use of DME, decreased mobility, difficulty walking, decreased ROM, decreased strength, hypomobility, increased fascial restrictions, impaired perceived functional ability, increased muscle spasms, impaired flexibility, improper body mechanics, and pain.   ACTIVITY LIMITATIONS: carrying, lifting, bending, sitting, standing, squatting, sleeping, stairs, transfers, bed mobility, locomotion level, and caring for others  PARTICIPATION LIMITATIONS: meal prep, cleaning, laundry, shopping, community activity,  occupation, and yard work  PERSONAL FACTORS: Time since onset of injury/illness/exacerbation are also affecting patient's functional outcome.   REHAB POTENTIAL: Good  CLINICAL DECISION MAKING: Stable/uncomplicated  EVALUATION COMPLEXITY: Low   GOALS: Goals reviewed with patient? Yes  SHORT TERM GOALS: Target date: 12/02/2021 patient will be independent with initial HEP Baseline: Goal status: IN PROGRESS  2.  Patient will improve bilateral knee extension to -2 Baseline: see above Goal status: IN PROGRESS    LONG TERM GOALS: Target date: 12/23/2021  Patient will be independent in self management strategies to improve quality of life and functional outcomes. Baseline:  Goal status: IN PROGRESS  2.  Patient will improve FOTO score to predicted value Baseline: 55 Goal status: IN PROGRESS  3.  Patient will increase  leg MMTs to 5/5 without pain to promote return to ambulation community distances with minimal deviation. Baseline: see above Goal status: IN PROGRESS  4.   Patient will report at least 50% improvement in overall symptoms and/or function to demonstrate improved functional mobility Baseline:  Goal status: IN PROGRESS  5.  Patient will improve 5 times sit to stand score from 27.28 sec to 22 sec to demonstrate improved functional mobility and increased lower extremity strength.  Baseline: 27.28 sec Goal status: IN PROGRESS   PLAN:  PT FREQUENCY: 1x/week  PT DURATION: 6 weeks  PLANNED INTERVENTIONS: Therapeutic exercises, Therapeutic activity, Neuromuscular re-education, Balance training, Gait training, Patient/Family education, Joint manipulation, Joint mobilization, Stair training, Orthotic/Fit training, DME instructions, Aquatic Therapy, Dry Needling, Electrical stimulation, Spinal manipulation, Spinal mobilization, Cryotherapy, Moist heat, Compression bandaging, scar mobilization, Splintting, Taping, Traction, Ultrasound, Ionotophoresis 4mg /ml Dexamethasone,  and Manual therapy  PLAN FOR NEXT SESSION: Continue to progress knee mobility and strength as able   8:27 AM, 11/24/22 01/23/23, PTA/CLT [facility] Ph: [phone]

## 2022-11-24 NOTE — Patient Instructions (Signed)

## 2022-11-24 NOTE — Therapy (Signed)
OUTPATIENT OCCUPATIONAL THERAPY ORTHO TREATMENT  Patient Name: Brian Mullen MRN: [id number] DOB:[birth year], 77 y.o., male Today's Date: 11/24/2022  PCP: [name], NP REFERRING PROVIDER: [name], NP  END OF SESSION:  OT End of Session - 11/24/22 0904     Visit Number 3    Number of Visits 4    Date for OT Re-Evaluation 12/05/22    Authorization Type [employer]    Authorization Time Period 4 Visits approved 12/22-1/20/24    Authorization - Number of Visits 4    Progress Note Due on Visit 10    OT Start Time 0905    OT Stop Time 0945    OT Time Calculation (min) 40 min    Activity Tolerance Patient tolerated treatment well    Behavior During Therapy [facility] for tasks assessed/performed             Past Medical History:  Diagnosis Date   Allergy    Asthma    Diabetes mellitus without complication (HCC)    GERD (gastroesophageal reflux disease)    Hypertension    Palpitations    a. 10/2019: Monitor showing rare PVC's and no significant arrhythmias.    Past Surgical History:  Procedure Laterality Date   CATARACT EXTRACTION Bilateral    02/23/2022, 03/28/2022   ELBOW SURGERY     KNEE SURGERY Right    TONSILLECTOMY     Patient Active Problem List   Diagnosis Date Noted   Anemia 10/13/2022   Imbalance 01/12/2022   Type 2 diabetes mellitus with hyperglycemia (HCC) 09/30/2021   Knee pain, bilateral 09/30/2021   Leg edema 09/02/2021   Cellulitis of left upper extremity 06/27/2021   Persistent cough for 3 weeks or longer 05/20/2021   Sensation of fullness in both ears 05/20/2021   Lightheadedness 05/20/2021   Chronic right-sided thoracic back pain 09/06/2020   Leg cramping 03/19/2020   Urinary urgency 03/19/2020   Hyperlipidemia associated with type 2 diabetes mellitus (HCC) 09/13/2019   PVC's (premature ventricular contractions) 09/13/2019   Palpitations 09/08/2019   Presbycusis of both ears 06/22/2019   Tinnitus of both ears 03/08/2019    Testosterone deficiency 07/15/2018   Left shoulder pain 01/21/2016   Medicare annual wellness visit, subsequent 10/07/2015   Insomnia, psychophysiological 10/07/2015   Encounter for general adult medical examination without abnormal findings 10/07/2015   Asthma 03/14/2015   Lumbar radiculopathy, chronic 09/22/2013   Hypertension associated with diabetes (HCC) 08/18/2013    ONSET DATE: Chronic  REFERRING DIAG: left shoulder pain  THERAPY DIAG:  Chronic left shoulder pain  Other symptoms and signs involving the musculoskeletal system  Rationale for Evaluation and Treatment: Rehabilitation  SUBJECTIVE:   SUBJECTIVE STATEMENT: S: "I found that doing my exercises in the chair helps a lot."   PERTINENT HISTORY: In 2001 pt reports he fell 8 feet onto a gym floor when painting a wall. He landed on his left side with a half gallon of paint in his hand. Pt began having shoulder pain a few months after that. Initially it was annoying but recently it's become more painful. Pt received a cortisone shot 09/2022 that has improved his pain but has a continual ache in his left shoulder.   PRECAUTIONS: None  WEIGHT BEARING RESTRICTIONS: No  PAIN:  Are you having pain? Yes: NPRS scale: 4/10 Pain location: left shoulder, radiates down the arm Pain description: mild discomfort Aggravating factors: cold, lack of movement, too much exercise Relieving factors: rest  FALLS: Has patient fallen in last 6  months? No  PATIENT GOALS: To have less pain in his shoulder.   NEXT MD VISIT: not sure  OBJECTIVE:   HAND DOMINANCE: Right  ADLs: Overall ADLs: Pt reports he can do all of his ADLs but has pain with tasks. Pt reports he has the most pain when he is not doing anything, such as sitting on the couch or being sedentary. Also hurts when sitting and leaning on the left arm. Pt reports he has to sleep on his right side, has to have support under the left arm.    FUNCTIONAL OUTCOME  MEASURES: FOTO: 70/100  UPPER EXTREMITY ROM:     Active ROM Left eval  Shoulder flexion 148  Shoulder abduction 156  Shoulder internal rotation 90  Shoulder external rotation 55  (Blank rows = not tested)    UPPER EXTREMITY MMT:     MMT Left eval  Shoulder flexion 5/5  Shoulder abduction 5/5  Shoulder internal rotation 5/5  Shoulder external rotation 4+/5  (Blank rows = not tested)    TODAY'S TREATMENT:                                                                                                                              DATE:   11/24/22 -Myofascial release to left upper arm, trapezius, and scapular regions to decrease pain and fascial restrictions and increase joint ROM.  -P/ROM: supine-flexion, abduction, er/IR, horizontal abduction -A/ROM: supine-protraction, flexion, abduction, er/IR, horizontal abduction, 10 reps -Pulleys: flexion, abduction, x60" -Scapular strengthening: red theraband-row, extension, retraction, 10 reps each -Shoulder strengthening: 2lb dumbbell, protraction, flexion, abduction, er/IR, horizontal abduction, 10 reps  11/19/22 -Myofascial release to left upper arm, trapezius, and scapular regions to decrease pain and fascial restrictions and increase joint ROM.  -P/ROM: supine-flexion, abduction, er/IR, horizontal abduction -A/ROM: supine-protraction, flexion, abduction, er/IR, horizontal abduction, 10 reps -Proximal shoulder strengthening: supine-paddles, criss cross, circles each direction, 10 reps each -A/ROM: seated-protraction, flexion, abduction, er/IR, horizontal abduction, 10 reps -Scapular strengthening: seated-red theraband-row, extension, retraction, 10 reps each -UBE: level 1, 3' forward 3' reverse, pace: 5.0  PATIENT EDUCATION: Education details: scapula strengthening Person educated: Patient Education method: Explanation, Demonstration, and Handouts Education comprehension: verbalized understanding and returned  demonstration  HOME EXERCISE PROGRAM: Eval: A/ROM 1/9: scapula strengthening  GOALS: Goals reviewed with patient? Yes  SHORT TERM GOALS: Target date: 12/05/22  Pt will be provided with and educated on HEP to improve mobility of LUE required for ADL completion.   Goal status: IN PROGRESS  2.  Pt will decrease pain in LUE to 2/10 or less to improve ability to sleep in preferred position for 2+ consecutive hours without waking due to pain.   Goal status: IN PROGRESS  3.  Pt will decrease LUE fascial restrictions to min amounts or less to improve mobility required for functional reaching tasks.   Goal status: IN PROGRESS  4.  Pt will increase LUE A/ROM by 10 degrees or more to improve mobility  required for reaching overhead and behind back during dressing and bathing tasks.    Goal status: IN PROGRESS  5.  Pt will improve LUE stability to promote decreased pain during sedentary tasks.   Goal status: IN PROGRESS    ASSESSMENT:  CLINICAL IMPRESSION: Pt reporting this session that his pain has not been as bad, and he tolerates completing his exercises in sitting better than standing. With all exercises this session he demonstrates full ROM, even when adding 2lb dumbbells for strengthening. Pt is able to complete all tasks this session with short rest breaks between each exercise due to mild fatigue. OT providing verbal cuing for positioning and technique throughout entire session.     PERFORMANCE DEFICITS: in functional skills including ADLs, IADLs, ROM, strength, pain, fascial restrictions, and UE functional use    PLAN:  OT FREQUENCY: 1x/week  OT DURATION: 4 weeks  PLANNED INTERVENTIONS: self care/ADL training, therapeutic exercise, therapeutic activity, manual therapy, passive range of motion, electrical stimulation, ultrasound, and patient/family education  CONSULTED AND AGREED WITH PLAN OF CARE: Patient  PLAN FOR NEXT SESSION: Follow up on HEP, continue with  theraband exercises and scapular strengthening   Guadelupe Sabin, OTR/L  [phone] 11/24/2022, 9:45 AM

## 2022-12-02 ENCOUNTER — Encounter (HOSPITAL_COMMUNITY): Payer: Self-pay | Admitting: Occupational Therapy

## 2022-12-02 ENCOUNTER — Ambulatory Visit (HOSPITAL_COMMUNITY): Payer: Medicare HMO | Admitting: Physical Therapy

## 2022-12-02 ENCOUNTER — Ambulatory Visit (HOSPITAL_COMMUNITY): Payer: Medicare HMO | Admitting: Occupational Therapy

## 2022-12-02 DIAGNOSIS — G8929 Other chronic pain: Secondary | ICD-10-CM

## 2022-12-02 DIAGNOSIS — M25512 Pain in left shoulder: Secondary | ICD-10-CM | POA: Diagnosis not present

## 2022-12-02 DIAGNOSIS — M25561 Pain in right knee: Secondary | ICD-10-CM | POA: Diagnosis not present

## 2022-12-02 DIAGNOSIS — R262 Difficulty in walking, not elsewhere classified: Secondary | ICD-10-CM

## 2022-12-02 DIAGNOSIS — R29898 Other symptoms and signs involving the musculoskeletal system: Secondary | ICD-10-CM | POA: Diagnosis not present

## 2022-12-02 DIAGNOSIS — M25562 Pain in left knee: Secondary | ICD-10-CM | POA: Diagnosis not present

## 2022-12-02 NOTE — Therapy (Signed)
OUTPATIENT PHYSICAL THERAPY TREATMENT Patient Name: Brian Mullen Reason MRN: [id number] DOB:02-Apr-1946, 77 y.o., male Today's Date: 12/02/2022  END OF SESSION:  PT End of Session - 12/02/22 1023     Visit Number 3    Number of Visits 6    Date for PT Re-Evaluation 12/23/22    Authorization Type [employer]    Authorization Time Period cohere approved 6 visits from 12/27-2/07    Authorization - Visit Number 3    Authorization - Number of Visits 6    Progress Note Due on Visit 6    PT Start Time 1032    PT Stop Time 1111    PT Time Calculation (min) 39 min    Activity Tolerance Patient tolerated treatment well    Behavior During Therapy WFL for tasks assessed/performed             Past Medical History:  Diagnosis Date   Allergy    Asthma    Diabetes mellitus without complication (HCC)    GERD (gastroesophageal reflux disease)    Hypertension    Palpitations    a. 10/2019: Monitor showing rare PVC's and no significant arrhythmias.    Past Surgical History:  Procedure Laterality Date   CATARACT EXTRACTION Bilateral    02/23/2022, 03/28/2022   ELBOW SURGERY     KNEE SURGERY Right    TONSILLECTOMY     Patient Active Problem List   Diagnosis Date Noted   Anemia 10/13/2022   Imbalance 01/12/2022   Type 2 diabetes mellitus with hyperglycemia (HCC) 09/30/2021   Knee pain, bilateral 09/30/2021   Leg edema 09/02/2021   Cellulitis of left upper extremity 06/27/2021   Persistent cough for 3 weeks or longer 05/20/2021   Sensation of fullness in both ears 05/20/2021   Lightheadedness 05/20/2021   Chronic right-sided thoracic back pain 09/06/2020   Leg cramping 03/19/2020   Urinary urgency 03/19/2020   Hyperlipidemia associated with type 2 diabetes mellitus (HCC) 09/13/2019   PVC's (premature ventricular contractions) 09/13/2019   Palpitations 09/08/2019   Presbycusis of both ears 06/22/2019   Tinnitus of both ears 03/08/2019   Testosterone deficiency 07/15/2018   Left shoulder  pain 01/21/2016   Medicare annual wellness visit, subsequent 10/07/2015   Insomnia, psychophysiological 10/07/2015   Encounter for general adult medical examination without abnormal findings 10/07/2015   Asthma 03/14/2015   Lumbar radiculopathy, chronic 09/22/2013   Hypertension associated with diabetes (HCC) 08/18/2013    PCP: Doreene Nest, NP   REFERRING PROVIDER:   Doreene Nest, NP    REFERRING DIAG: M25.561,M25.562,G89.29 (ICD-10-CM) - Chronic pain of both knees M25.512,G89.29 (ICD-10-CM) - Chronic left shoulder pain  THERAPY DIAG:  Chronic left shoulder pain  Chronic pain of both knees  Rationale for Evaluation and Treatment: Rehabilitation  ONSET DATE: 2001  SUBJECTIVE:   SUBJECTIVE STATEMENT: Pt  just completed OT prior to PT session today.  States he has not done his leg exercises the past 2 days because "it's been too cold and his knees are stiff".  Reports his legs tend to give out and his knees buckle at times.  Currently 5/10 in Lt knee and his hips are feeling better.  Pt without AD today.  Evaluation:  Had a fall 2001; painting a wall; on a staircase type ladder; fell about 8 feet.  Injured both knees and left shoulder (seeing OT for left shoulder) has had therapy before and injections; currently his chief complaints are knee pain; left knee gives way more than right; difficulty  walking or standing for a prolonged period of time.   PERTINENT HISTORY: Had surgery left knee many years before fall PAIN:  Are you having pain? Yes: NPRS scale: 5/10 Pain location: Lt knees Pain description: aching and stiff Aggravating factors: standing and walking for a long period of time Relieving factors: rest and elevate, ice  PRECAUTIONS: None  WEIGHT BEARING RESTRICTIONS: No  FALLS:  Has patient fallen in last 6 months? No  LIVING ENVIRONMENT: Lives with: lives with their spouse Lives in: House/apartment Stairs: Yes: External: 4 steps; on left going  up Has following equipment at home: Single point cane and shower chair  OCCUPATION: handyman, carpentry type things  PLOF: Independent  PATIENT GOALS: get my knees stronger  NEXT MD VISIT: unsure but sees PCP regularly  OBJECTIVE:   DIAGNOSTIC FINDINGS: x-rays in 2020 moderate to severe arthritis both knees  PATIENT SURVEYS:  FOTO 55  COGNITION: Overall cognitive status: Within functional limits for tasks assessed     SENSATION: WFL  EDEMA:  None noted   POSTURE:  wide bos  PALPATION: Tenderness right knee lateral to patella  LOWER EXTREMITY ROM:  Active ROM Right eval Left eval  Hip flexion    Hip extension    Hip abduction    Hip adduction    Hip internal rotation    Hip external rotation    Knee flexion 115 114  Knee extension -8 -4  Ankle dorsiflexion    Ankle plantarflexion    Ankle inversion    Ankle eversion     (Blank rows = not tested)  LOWER EXTREMITY MMT:  MMT Right eval Left eval Right 12/02/22 Left 12/02/22  Hip flexion 4+ 4+    Hip extension   4 4  Hip abduction   4 4  Hip adduction      Hip internal rotation      Hip external rotation      Knee flexion   4 4  Knee extension 4+ 4    Ankle dorsiflexion 5 4+    Ankle plantarflexion      Ankle inversion      Ankle eversion       (Blank rows = not tested)  FUNCTIONAL TESTS:  5 times sit to stand: 27.28 sec using hands on thighs to push up to standing 2 minute walk test: 1/9: 226 feet with SPC  GAIT: Distance walked: 50 ft Assistive device utilized: None Level of assistance: Modified independence Comments: wide BOS; slow antalgic gait speed   TODAY'S TREATMENT:                                                                                                                              DATE:  12/02/22 Supine:  Bridge 10X SLR 10X each   Seated:  STS no UE 10X Standing: hip abduction 10X  Hip extension 10X  Knee flexion 10X Nustep seat 8 LE only 5 minutes level 1 at  EOS  11/24/22 Goal review 2MWT 226 feet with SPC Seated: LAQ 10X each   Sit to stands no UE 2X5 Supine:  HEP review QS, heelslides 10X each  Bridge 2X5  Evaluation:  physical therapy evaluation and HEP instruction    PATIENT EDUCATION:  Education details: 12/02/22: see below assessment; importance of completing therex and how this relates to his c/o stiffness and LE's giving way.  Evaluation:  Patient educated on exam findings, POC, scope of PT, HEP. Person educated: Patient Education method: Explanation, Demonstration, and Handouts Education comprehension: verbalized understanding, returned demonstration, verbal cues required, and tactile cues required  HOME EXERCISE PROGRAM: Access Code: [email or web address] URL: [email or web address] Date: 11/11/2022 Prepared by: AP - Rehab Exercises - Supine Quad Set  - 1 x daily - 7 x weekly - 3 sets - 10 reps - Supine Heel Slide  - 1 x daily - 7 x weekly - 3 sets - 10 reps  Access Code: [email or web address] URL: [email or web address] Date: 12/02/2022 Prepared by: Roseanne Reno Exercises - Supine Bridge  - 2 x daily - 7 x weekly - 2 sets - 10 reps - Supine Active Straight Leg Raise  - 2 x daily - 7 x weekly - 2 sets - 10 reps - Sit to Stand Without Arm Support  - 2 x daily - 7 x weekly - 1 sets - 10 reps - Standing Hip Abduction AROM  - 2 x daily - 7 x weekly - 2 sets - 10 reps - Standing Hip Extension  - 2 x daily - 7 x weekly - 2 sets - 10 reps - Standing Knee Flexion  - 2 x daily - 7 x weekly - 3 sets - 10 reps ASSESSMENT:  CLINICAL IMPRESSION: Discussed above subjective complaints and educated on importance of completing therex and how this relates to his c/o stiffness and LE's giving way.  Explained he will not improve just doing his exercises here at the clinic and he must do these at home as instructed with HEP.  PT did not have his [facility] today as it "was in the other truck".  Continues with hamstring cramping with bridge, however  not as often as last visit. Check mm strength of remaining hip and knee flexion with some weakness noted.  Added exercises to address these and added exercise to HEP.   Patient will benefit from skilled physical therapy services to address these deficits to reduce pain and improve level of function with ADLs and functional mobility tasks.   OBJECTIVE IMPAIRMENTS: Abnormal gait, decreased activity tolerance, decreased balance, decreased endurance, decreased knowledge of use of DME, decreased mobility, difficulty walking, decreased ROM, decreased strength, hypomobility, increased fascial restrictions, impaired perceived functional ability, increased muscle spasms, impaired flexibility, improper body mechanics, and pain.   ACTIVITY LIMITATIONS: carrying, lifting, bending, sitting, standing, squatting, sleeping, stairs, transfers, bed mobility, locomotion level, and caring for others  PARTICIPATION LIMITATIONS: meal prep, cleaning, laundry, shopping, community activity, occupation, and yard work  PERSONAL FACTORS: Time since onset of injury/illness/exacerbation are also affecting patient's functional outcome.   REHAB POTENTIAL: Good  CLINICAL DECISION MAKING: Stable/uncomplicated  EVALUATION COMPLEXITY: Low   GOALS: Goals reviewed with patient? Yes  SHORT TERM GOALS: Target date: 12/02/2021 patient will be independent with initial HEP Baseline: Goal status: IN PROGRESS  2.  Patient will improve bilateral knee extension to -2 Baseline: see above Goal status: IN PROGRESS    LONG TERM GOALS: Target date: 12/23/2021  Patient will be independent in self management strategies to improve quality of life  and functional outcomes. Baseline:  Goal status: IN PROGRESS  2.  Patient will improve FOTO score to predicted value Baseline: 55 Goal status: IN PROGRESS  3.  Patient will increase  leg MMTs to 5/5 without pain to promote return to ambulation community distances with minimal  deviation. Baseline: see above Goal status: IN PROGRESS  4.   Patient will report at least 50% improvement in overall symptoms and/or function to demonstrate improved functional mobility Baseline:  Goal status: IN PROGRESS  5.  Patient will improve 5 times sit to stand score from 27.28 sec to 22 sec to demonstrate improved functional mobility and increased lower extremity strength.  Baseline: 27.28 sec Goal status: IN PROGRESS   PLAN:  PT FREQUENCY: 1x/week  PT DURATION: 6 weeks  PLANNED INTERVENTIONS: Therapeutic exercises, Therapeutic activity, Neuromuscular re-education, Balance training, Gait training, Patient/Family education, Joint manipulation, Joint mobilization, Stair training, Orthotic/Fit training, DME instructions, Aquatic Therapy, Dry Needling, Electrical stimulation, Spinal manipulation, Spinal mobilization, Cryotherapy, Moist heat, Compression bandaging, scar mobilization, Splintting, Taping, Traction, Ultrasound, Ionotophoresis 4mg /ml Dexamethasone, and Manual therapy  PLAN FOR NEXT SESSION: Continue to progress knee mobility and strength as able.  Follow up on HEP compliance as he has not been doing these despite instruction.    10:57 AM, 12/02/22 Teena Irani, PTA/CLT [place] Ph: [phone]

## 2022-12-02 NOTE — Patient Instructions (Signed)

## 2022-12-02 NOTE — Therapy (Signed)
OUTPATIENT OCCUPATIONAL THERAPY ORTHO TREATMENT AND REASSESSMENT  Patient Name: Brian Mullen MRN: [id number] DOB:July 09, 1946, 77 y.o., male Today's Date: 12/02/2022  PCP: Alma Friendly, NP REFERRING PROVIDER: Alma Friendly, NP  END OF SESSION:  OT End of Session - 12/02/22 0951     Visit Number 4    Number of Visits 4    Date for OT Re-Evaluation 12/05/22    Authorization Type [employer]    Authorization Time Period 4 Visits approved 12/22-1/20/24    Authorization - Visit Number 3    Authorization - Number of Visits 4    Progress Note Due on Visit 10    OT Start Time [phone]    OT Stop Time 1030    OT Time Calculation (min) 39 min    Activity Tolerance Patient tolerated treatment well    Behavior During Therapy [facility] for tasks assessed/performed             Past Medical History:  Diagnosis Date   Allergy    Asthma    Diabetes mellitus without complication ([place])    GERD (gastroesophageal reflux disease)    Hypertension    Palpitations    a. 10/2019: Monitor showing rare PVC's and no significant arrhythmias.    Past Surgical History:  Procedure Laterality Date   CATARACT EXTRACTION Bilateral    02/23/2022, 03/28/2022   ELBOW SURGERY     KNEE SURGERY Right    TONSILLECTOMY     Patient Active Problem List   Diagnosis Date Noted   Anemia 10/13/2022   Imbalance 01/12/2022   Type 2 diabetes mellitus with hyperglycemia ([place]) 09/30/2021   Knee pain, bilateral 09/30/2021   Leg edema 09/02/2021   Cellulitis of left upper extremity 06/27/2021   Persistent cough for 3 weeks or longer 05/20/2021   Sensation of fullness in both ears 05/20/2021   Lightheadedness 05/20/2021   Chronic right-sided thoracic back pain 09/06/2020   Leg cramping 03/19/2020   Urinary urgency 03/19/2020   Hyperlipidemia associated with type 2 diabetes mellitus ([place]) 09/13/2019   PVC's (premature ventricular contractions) 09/13/2019   Palpitations 09/08/2019   Presbycusis of both ears  06/22/2019   Tinnitus of both ears 03/08/2019   Testosterone deficiency 07/15/2018   Left shoulder pain 01/21/2016   Medicare annual wellness visit, subsequent 10/07/2015   Insomnia, psychophysiological 10/07/2015   Encounter for general adult medical examination without abnormal findings 10/07/2015   Asthma 03/14/2015   Lumbar radiculopathy, chronic 09/22/2013   Hypertension associated with diabetes ([place]) 08/18/2013    ONSET DATE: Chronic  REFERRING DIAG: left shoulder pain  THERAPY DIAG:  Chronic left shoulder pain  Other symptoms and signs involving the musculoskeletal system  Rationale for Evaluation and Treatment: Rehabilitation  SUBJECTIVE:   SUBJECTIVE STATEMENT: S: "I found that doing my exercises in the chair helps a lot."   PERTINENT HISTORY: In 2001 pt reports he fell 8 feet onto a gym floor when painting a wall. He landed on his left side with a half gallon of paint in his hand. Pt began having shoulder pain a few months after that. Initially it was annoying but recently it's become more painful. Pt received a cortisone shot 09/2022 that has improved his pain but has a continual ache in his left shoulder.   PRECAUTIONS: None  WEIGHT BEARING RESTRICTIONS: No  PAIN:  Are you having pain? Yes: NPRS scale: 4/10 Pain location: left shoulder, radiates down the arm Pain description: mild discomfort Aggravating factors: cold, lack of movement, too much exercise Relieving  factors: rest  FALLS: Has patient fallen in last 6 months? No  PATIENT GOALS: To have less pain in his shoulder.   NEXT MD VISIT: not sure  OBJECTIVE:   HAND DOMINANCE: Right  ADLs: Overall ADLs: Pt reports he can do all of his ADLs but has pain with tasks. Pt reports he has the most pain when he is not doing anything, such as sitting on the couch or being sedentary. Also hurts when sitting and leaning on the left arm. Pt reports he has to sleep on his right side, has to have support under the  left arm.    FUNCTIONAL OUTCOME MEASURES: FOTO: 70/100 FOTO: 67.59 (12/02/22)  UPPER EXTREMITY ROM:     Active ROM Left eval Left 12/02/22  Shoulder flexion 148 152  Shoulder abduction 156 163  Shoulder internal rotation 90 90  Shoulder external rotation 55 69  (Blank rows = not tested)    UPPER EXTREMITY MMT:     MMT Left eval Left 12/02/22  Shoulder flexion 5/5 5/5  Shoulder abduction 5/5 5/5  Shoulder internal rotation 5/5 5/5  Shoulder external rotation 4+/5 5/5  (Blank rows = not tested)    TODAY'S TREATMENT:                                                                                                                              DATE:   12/02/22 -A/ROM: seated, flexion, abduction, protraction, horizontal abduction -Shoulder strengthening: 2lb dumbbell, protraction, flexion, abduction, er/IR, horizontal abduction, 10 reps -PNF shoulder strengthening: red theraband, chest pulls, er pulls, PNF up, PNF down, x10 -Scapular strengthening: red theraband-row, extension, retraction, 10 reps each -Willeen Niece on the wall: flexion and abduction, x10  11/24/22 -Myofascial release to left upper arm, trapezius, and scapular regions to decrease pain and fascial restrictions and increase joint ROM.  -P/ROM: supine-flexion, abduction, er/IR, horizontal abduction -A/ROM: supine-protraction, flexion, abduction, er/IR, horizontal abduction, 10 reps -Pulleys: flexion, abduction, x60" -Scapular strengthening: red theraband-row, extension, retraction, 10 reps each -Shoulder strengthening: 2lb dumbbell, protraction, flexion, abduction, er/IR, horizontal abduction, 10 reps  11/19/22 -Myofascial release to left upper arm, trapezius, and scapular regions to decrease pain and fascial restrictions and increase joint ROM.  -P/ROM: supine-flexion, abduction, er/IR, horizontal abduction -A/ROM: supine-protraction, flexion, abduction, er/IR, horizontal abduction, 10 reps -Proximal shoulder  strengthening: supine-paddles, criss cross, circles each direction, 10 reps each -A/ROM: seated-protraction, flexion, abduction, er/IR, horizontal abduction, 10 reps -Scapular strengthening: seated-red theraband-row, extension, retraction, 10 reps each -UBE: level 1, 3' forward 3' reverse, pace: 5.0  PATIENT EDUCATION: Education details: Shoulder strengthening Person educated: Patient Education method: Explanation, Demonstration, and Handouts Education comprehension: verbalized understanding and returned demonstration  HOME EXERCISE PROGRAM: Eval: A/ROM 1/9: scapula strengthening 1/17: shoulder Strengthening  GOALS: Goals reviewed with patient? Yes  SHORT TERM GOALS: Target date: 01/01/23  Pt will be provided with and educated on HEP to improve mobility of LUE required for ADL completion.   Goal status: IN  PROGRESS  2.  Pt will decrease pain in LUE to 2/10 or less to improve ability to sleep in preferred position for 2+ consecutive hours without waking due to pain.   Goal status: IN PROGRESS  3.  Pt will decrease LUE fascial restrictions to min amounts or less to improve mobility required for functional reaching tasks.   Goal status: IN PROGRESS  4.  Pt will increase LUE A/ROM by 10 degrees or more to improve mobility required for reaching overhead and behind back during dressing and bathing tasks.    Goal status: IN PROGRESS  5.  Pt will improve LUE stability to promote decreased pain during sedentary tasks.   Goal status: IN PROGRESS    ASSESSMENT:  CLINICAL IMPRESSION: Pt was seen for a reassessment this session. He is continuing to demonstrate improving ROM and strength, however his pain and scapular stability continue to be poor. He reports continued difficulty with sustained lifting/holding of any item heavier than 8-10lbs, which limits his ability to complete IADL's such as cooking, cleaning, and yard work at home. This session continued to focus on strengthening  and stability exercises, as well as continuing to build his HEP. OT Providing verbal and tactile cuing for positioning and technique.    PERFORMANCE DEFICITS: in functional skills including ADLs, IADLs, ROM, strength, pain, fascial restrictions, and UE functional use    PLAN:  OT FREQUENCY: 1x/week  OT DURATION: 4 weeks  PLANNED INTERVENTIONS: self care/ADL training, therapeutic exercise, therapeutic activity, manual therapy, passive range of motion, electrical stimulation, ultrasound, and patient/family education  CONSULTED AND AGREED WITH PLAN OF CARE: Patient  PLAN FOR NEXT SESSION: Follow up on HEP, continue with theraband exercises and scapular strengthening, start scapular stabilizing exercises.   Trish Mage, OTR/L [phone] 12/02/2022, 9:52 AM

## 2022-12-10 ENCOUNTER — Encounter (HOSPITAL_COMMUNITY): Payer: Medicare HMO

## 2022-12-10 ENCOUNTER — Encounter (HOSPITAL_COMMUNITY): Payer: Medicare HMO | Admitting: Occupational Therapy

## 2022-12-16 ENCOUNTER — Ambulatory Visit (HOSPITAL_COMMUNITY): Payer: Medicare HMO

## 2022-12-16 DIAGNOSIS — R29898 Other symptoms and signs involving the musculoskeletal system: Secondary | ICD-10-CM | POA: Diagnosis not present

## 2022-12-16 DIAGNOSIS — M25561 Pain in right knee: Secondary | ICD-10-CM | POA: Diagnosis not present

## 2022-12-16 DIAGNOSIS — M25562 Pain in left knee: Secondary | ICD-10-CM | POA: Diagnosis not present

## 2022-12-16 DIAGNOSIS — R262 Difficulty in walking, not elsewhere classified: Secondary | ICD-10-CM

## 2022-12-16 DIAGNOSIS — M25512 Pain in left shoulder: Secondary | ICD-10-CM | POA: Diagnosis not present

## 2022-12-16 DIAGNOSIS — G8929 Other chronic pain: Secondary | ICD-10-CM

## 2022-12-16 NOTE — Therapy (Signed)
OUTPATIENT PHYSICAL THERAPY TREATMENT Patient Name: Brian Mullen MRN: [id number] DOB:05/05/1946, 77 y.o., male Today's Date: 12/16/2022  END OF SESSION:  PT End of Session - 12/16/22 0910     Visit Number 4    Number of Visits 6    Date for PT Re-Evaluation 12/23/22    Authorization Type [employer]    Authorization Time Period cohere approved 6 visits from 12/27-2/07    Authorization - Visit Number 4    Authorization - Number of Visits 6    Progress Note Due on Visit 6    PT Start Time 0910   late check in   PT Stop Time 0944    PT Time Calculation (min) 34 min    Activity Tolerance Patient tolerated treatment well    Behavior During Therapy [facility] for tasks assessed/performed             Past Medical History:  Diagnosis Date   Allergy    Asthma    Diabetes mellitus without complication ([facility])    GERD (gastroesophageal reflux disease)    Hypertension    Palpitations    a. 10/2019: Monitor showing rare PVC's and no significant arrhythmias.    Past Surgical History:  Procedure Laterality Date   CATARACT EXTRACTION Bilateral    02/23/2022, 03/28/2022   ELBOW SURGERY     KNEE SURGERY Right    TONSILLECTOMY     Patient Active Problem List   Diagnosis Date Noted   Anemia 10/13/2022   Imbalance 01/12/2022   Type 2 diabetes mellitus with hyperglycemia ([place]) 09/30/2021   Knee pain, bilateral 09/30/2021   Leg edema 09/02/2021   Cellulitis of left upper extremity 06/27/2021   Persistent cough for 3 weeks or longer 05/20/2021   Sensation of fullness in both ears 05/20/2021   Lightheadedness 05/20/2021   Chronic right-sided thoracic back pain 09/06/2020   Leg cramping 03/19/2020   Urinary urgency 03/19/2020   Hyperlipidemia associated with type 2 diabetes mellitus ([place]) 09/13/2019   PVC's (premature ventricular contractions) 09/13/2019   Palpitations 09/08/2019   Presbycusis of both ears 06/22/2019   Tinnitus of both ears 03/08/2019   Testosterone deficiency 07/15/2018    Left shoulder pain 01/21/2016   Medicare annual wellness visit, subsequent 10/07/2015   Insomnia, psychophysiological 10/07/2015   Encounter for general adult medical examination without abnormal findings 10/07/2015   Asthma 03/14/2015   Lumbar radiculopathy, chronic 09/22/2013   Hypertension associated with diabetes ([place]) 08/18/2013    PCP: Pleas Koch, NP   REFERRING PROVIDER:   Pleas Koch, NP    REFERRING DIAG: M25.561,M25.562,G89.29 (ICD-10-CM) - Chronic pain of both knees M25.512,G89.29 (ICD-10-CM) - Chronic left shoulder pain  THERAPY DIAG:  Chronic pain of both knees  Difficulty in walking, not elsewhere classified  Rationale for Evaluation and Treatment: Rehabilitation  ONSET DATE: 2001  SUBJECTIVE:   SUBJECTIVE STATEMENT: Patient reports he has been busy dealing with emptying out a storage unit has been going up and ladder; increased pain and stiffness complaint today; knees feeling stiff with the damp weather; "12/10" stiffness.    Evaluation:  Had a fall 2001; painting a wall; on a staircase type ladder; fell about 8 feet.  Injured both knees and left shoulder (seeing OT for left shoulder) has had therapy before and injections; currently his chief complaints are knee pain; left knee gives way more than right; difficulty walking or standing for a prolonged period of time.   PERTINENT HISTORY: Had surgery left knee many years before fall PAIN:  Are you having pain? Yes: NPRS scale: 5/10 Pain location: Lt knees Pain description: aching and stiff Aggravating factors: standing and walking for a long period of time Relieving factors: rest and elevate, ice  PRECAUTIONS: None  WEIGHT BEARING RESTRICTIONS: No  FALLS:  Has patient fallen in last 6 months? No  LIVING ENVIRONMENT: Lives with: lives with their spouse Lives in: House/apartment Stairs: Yes: External: 4 steps; on left going up Has following equipment at home: Single point cane and  shower chair  OCCUPATION: handyman, carpentry type things  PLOF: Independent  PATIENT GOALS: get my knees stronger  NEXT MD VISIT: unsure but sees PCP regularly  OBJECTIVE:   DIAGNOSTIC FINDINGS: x-rays in 2020 moderate to severe arthritis both knees  PATIENT SURVEYS:  FOTO 55  COGNITION: Overall cognitive status: Within functional limits for tasks assessed     SENSATION: WFL  EDEMA:  None noted   POSTURE:  wide bos  PALPATION: Tenderness right knee lateral to patella  LOWER EXTREMITY ROM:  Active ROM Right eval Left eval  Hip flexion    Hip extension    Hip abduction    Hip adduction    Hip internal rotation    Hip external rotation    Knee flexion 115 114  Knee extension -8 -4  Ankle dorsiflexion    Ankle plantarflexion    Ankle inversion    Ankle eversion     (Blank rows = not tested)  LOWER EXTREMITY MMT:  MMT Right eval Left eval Right 12/02/22 Left 12/02/22  Hip flexion 4+ 4+    Hip extension   4 4  Hip abduction   4 4  Hip adduction      Hip internal rotation      Hip external rotation      Knee flexion   4 4  Knee extension 4+ 4    Ankle dorsiflexion 5 4+    Ankle plantarflexion      Ankle inversion      Ankle eversion       (Blank rows = not tested)  FUNCTIONAL TESTS:  5 times sit to stand: 27.28 sec using hands on thighs to push up to standing 2 minute walk test: 1/9: 226 feet with SPC  GAIT: Distance walked: 50 ft Assistive device utilized: None Level of assistance: Modified independence Comments: wide BOS; slow antalgic gait speed   TODAY'S TREATMENT:                                                                                                                              DATE:  12/16/22 Standing: Heel raises 2 x 10 Knee drives on 6" box for flexion x 2' each Hip abduction 2 x 10 Slant board 10 x 10" Sit to stand no UE assist 2 x 5  Nustep seat 8; arms 8 x 5' for knee ROM     12/02/22 Supine:  Bridge 10X SLR  10X each   Seated:  STS no UE 10X Standing: hip abduction 10X  Hip extension 10X  Knee flexion 10X Nustep seat 8 LE only 5 minutes level 1 at EOS  11/24/22 Goal review 2MWT 226 feet with SPC Seated: LAQ 10X each   Sit to stands no UE 2X5 Supine:  HEP review QS, heelslides 10X each  Bridge 2X5  Evaluation:  physical therapy evaluation and HEP instruction    PATIENT EDUCATION:  Education details: 12/02/22: see below assessment; importance of completing therex and how this relates to his c/o stiffness and LE's giving way.  Evaluation:  Patient educated on exam findings, POC, scope of PT, HEP. Person educated: Patient Education method: Explanation, Demonstration, and Handouts Education comprehension: verbalized understanding, returned demonstration, verbal cues required, and tactile cues required  HOME EXERCISE PROGRAM: Access Code: [place] URL: [email or web address] Date: 11/11/2022 Prepared by: AP - Rehab Exercises - Supine Quad Set  - 1 x daily - 7 x weekly - 3 sets - 10 reps - Supine Heel Slide  - 1 x daily - 7 x weekly - 3 sets - 10 reps  Access Code: [email or web address] URL: [email or web address] Date: 12/02/2022 Prepared by: Roseanne Reno Exercises - Supine Bridge  - 2 x daily - 7 x weekly - 2 sets - 10 reps - Supine Active Straight Leg Raise  - 2 x daily - 7 x weekly - 2 sets - 10 reps - Sit to Stand Without Arm Support  - 2 x daily - 7 x weekly - 1 sets - 10 reps - Standing Hip Abduction AROM  - 2 x daily - 7 x weekly - 2 sets - 10 reps - Standing Hip Extension  - 2 x daily - 7 x weekly - 2 sets - 10 reps - Standing Knee Flexion  - 2 x daily - 7 x weekly - 3 sets - 10 reps ASSESSMENT:  CLINICAL IMPRESSION: Late arrival today.  Patient walks in with his [facility] today; has subjective complaints of increased pain due to activity at home.  Patient reports left knee pain > right knee pain.  Slow movement with sit to stand from chair without UE assist.    Patient will benefit from skilled physical therapy services to address these deficits to reduce pain and improve level of function with ADLs and functional mobility tasks.   OBJECTIVE IMPAIRMENTS: Abnormal gait, decreased activity tolerance, decreased balance, decreased endurance, decreased knowledge of use of DME, decreased mobility, difficulty walking, decreased ROM, decreased strength, hypomobility, increased fascial restrictions, impaired perceived functional ability, increased muscle spasms, impaired flexibility, improper body mechanics, and pain.   ACTIVITY LIMITATIONS: carrying, lifting, bending, sitting, standing, squatting, sleeping, stairs, transfers, bed mobility, locomotion level, and caring for others  PARTICIPATION LIMITATIONS: meal prep, cleaning, laundry, shopping, community activity, occupation, and yard work  PERSONAL FACTORS: Time since onset of injury/illness/exacerbation are also affecting patient's functional outcome.   REHAB POTENTIAL: Good  CLINICAL DECISION MAKING: Stable/uncomplicated  EVALUATION COMPLEXITY: Low   GOALS: Goals reviewed with patient? Yes  SHORT TERM GOALS: Target date: 12/02/2021 patient will be independent with initial HEP Baseline: Goal status: IN PROGRESS  2.  Patient will improve bilateral knee extension to -2 Baseline: see above Goal status: IN PROGRESS    LONG TERM GOALS: Target date: 12/23/2021  Patient will be independent in self management strategies to improve quality of life and functional outcomes. Baseline:  Goal status: IN PROGRESS  2.  Patient will improve FOTO score to predicted value Baseline: 55 Goal status: IN PROGRESS  3.  Patient will increase  leg MMTs to 5/5 without pain to promote return to ambulation community distances with minimal deviation. Baseline: see above Goal status: IN PROGRESS  4.   Patient will report at least 50% improvement in overall symptoms and/or function to demonstrate improved  functional mobility Baseline:  Goal status: IN PROGRESS  5.  Patient will improve 5 times sit to stand score from 27.28 sec to 22 sec to demonstrate improved functional mobility and increased lower extremity strength.  Baseline: 27.28 sec Goal status: IN PROGRESS   PLAN:  PT FREQUENCY: 1x/week  PT DURATION: 6 weeks  PLANNED INTERVENTIONS: Therapeutic exercises, Therapeutic activity, Neuromuscular re-education, Balance training, Gait training, Patient/Family education, Joint manipulation, Joint mobilization, Stair training, Orthotic/Fit training, DME instructions, Aquatic Therapy, Dry Needling, Electrical stimulation, Spinal manipulation, Spinal mobilization, Cryotherapy, Moist heat, Compression bandaging, scar mobilization, Splintting, Taping, Traction, Ultrasound, Ionotophoresis 4mg /ml Dexamethasone, and Manual therapy  PLAN FOR NEXT SESSION: Continue to progress knee mobility and strength as able.   Reassess next visit   9:43 AM, 12/16/22 [place] Burkitt Small Clide Remmers MPT [facility] [place] [phone]

## 2022-12-18 ENCOUNTER — Other Ambulatory Visit: Payer: Self-pay | Admitting: Primary Care

## 2022-12-18 DIAGNOSIS — J309 Allergic rhinitis, unspecified: Secondary | ICD-10-CM

## 2022-12-18 MED ORDER — FEXOFENADINE HCL 180 MG PO TABS
180.0000 mg | ORAL_TABLET | Freq: Every day | ORAL | 1 refills | Status: AC
  Filled 2022-12-18 – 2023-04-15 (×2): qty 90, 90d supply, fill #0

## 2022-12-18 NOTE — Telephone Encounter (Signed)
From: [name] To: Office of Pleas Koch, NP Sent: 12/18/2022 8:17 AM EST Subject: Medication Renewal Request  Refills have been requested for the following medications:   fexofenadine (ALLEGRA) 180 MG tablet  Preferred pharmacy: [facility] Delivery method: Mail

## 2022-12-21 ENCOUNTER — Other Ambulatory Visit: Payer: Self-pay

## 2022-12-21 ENCOUNTER — Other Ambulatory Visit (HOSPITAL_COMMUNITY): Payer: Self-pay

## 2022-12-22 ENCOUNTER — Other Ambulatory Visit (HOSPITAL_COMMUNITY): Payer: Self-pay

## 2022-12-22 ENCOUNTER — Other Ambulatory Visit: Payer: Self-pay

## 2022-12-23 ENCOUNTER — Other Ambulatory Visit: Payer: Self-pay

## 2022-12-23 ENCOUNTER — Ambulatory Visit (HOSPITAL_COMMUNITY): Payer: Medicare HMO | Attending: Internal Medicine

## 2022-12-23 DIAGNOSIS — M25562 Pain in left knee: Secondary | ICD-10-CM | POA: Diagnosis not present

## 2022-12-23 DIAGNOSIS — R262 Difficulty in walking, not elsewhere classified: Secondary | ICD-10-CM | POA: Diagnosis not present

## 2022-12-23 DIAGNOSIS — G8929 Other chronic pain: Secondary | ICD-10-CM | POA: Insufficient documentation

## 2022-12-23 DIAGNOSIS — M25561 Pain in right knee: Secondary | ICD-10-CM | POA: Diagnosis not present

## 2022-12-23 NOTE — Therapy (Signed)
OUTPATIENT PHYSICAL THERAPY DISHARGE PHYSICAL THERAPY DISCHARGE SUMMARY  Visits from Start of Care: 5  Current functional level related to goals / functional outcomes: See below   Remaining deficits: See below   Education / Equipment: See below   Patient agrees to discharge. Patient goals were not met. Patient is being discharged due to lack of progress.  Patient Name: Kalden Wanke MRN: [id number] DOB:1946/01/27, 77 y.o., male Today's Date: 12/23/2022  END OF SESSION:  PT End of Session - 12/23/22 0914     Visit Number 5    Number of Visits 6    Date for PT Re-Evaluation 12/23/22    Authorization Type [employer]    Authorization Time Period cohere approved 6 visits from 12/27-2/07    Authorization - Visit Number 5    Authorization - Number of Visits 6    Progress Note Due on Visit 6    PT Start Time 0911   late check in   PT Stop Time 0945    PT Time Calculation (min) 34 min    Activity Tolerance Patient tolerated treatment well    Behavior During Therapy [facility] for tasks assessed/performed             Past Medical History:  Diagnosis Date   Allergy    Asthma    Diabetes mellitus without complication ([place])    GERD (gastroesophageal reflux disease)    Hypertension    Palpitations    a. 10/2019: Monitor showing rare PVC's and no significant arrhythmias.    Past Surgical History:  Procedure Laterality Date   CATARACT EXTRACTION Bilateral    02/23/2022, 03/28/2022   ELBOW SURGERY     KNEE SURGERY Right    TONSILLECTOMY     Patient Active Problem List   Diagnosis Date Noted   Anemia 10/13/2022   Imbalance 01/12/2022   Type 2 diabetes mellitus with hyperglycemia ([place]) 09/30/2021   Knee pain, bilateral 09/30/2021   Leg edema 09/02/2021   Cellulitis of left upper extremity 06/27/2021   Persistent cough for 3 weeks or longer 05/20/2021   Sensation of fullness in both ears 05/20/2021   Lightheadedness 05/20/2021   Chronic right-sided thoracic back pain 09/06/2020    Leg cramping 03/19/2020   Urinary urgency 03/19/2020   Hyperlipidemia associated with type 2 diabetes mellitus ([place]) 09/13/2019   PVC's (premature ventricular contractions) 09/13/2019   Palpitations 09/08/2019   Presbycusis of both ears 06/22/2019   Tinnitus of both ears 03/08/2019   Testosterone deficiency 07/15/2018   Left shoulder pain 01/21/2016   Medicare annual wellness visit, subsequent 10/07/2015   Insomnia, psychophysiological 10/07/2015   Encounter for general adult medical examination without abnormal findings 10/07/2015   Asthma 03/14/2015   Lumbar radiculopathy, chronic 09/22/2013   Hypertension associated with diabetes ([place]) 08/18/2013    PCP: Pleas Koch, NP   REFERRING PROVIDER:   Pleas Koch, NP    REFERRING DIAG: M25.561,M25.562,G89.29 (ICD-10-CM) - Chronic pain of both knees M25.512,G89.29 (ICD-10-CM) - Chronic left shoulder pain  THERAPY DIAG:  Chronic pain of both knees  Difficulty in walking, not elsewhere classified  Rationale for Evaluation and Treatment: Rehabilitation  ONSET DATE: 2001  SUBJECTIVE:   SUBJECTIVE STATEMENT: Patient feels he has not improved; having a "bad day" today; 9/10 pain; states exercise makes him more sore   Evaluation:  Had a fall 2001; painting a wall; on a staircase type ladder; fell about 8 feet.  Injured both knees and left shoulder (seeing OT for left shoulder) has had  therapy before and injections; currently his chief complaints are knee pain; left knee gives way more than right; difficulty walking or standing for a prolonged period of time.   PERTINENT HISTORY: Had surgery left knee many years before fall PAIN:  Are you having pain? Yes: NPRS scale: 9/10 Pain location: Lt knees Pain description: aching and stiff Aggravating factors: standing and walking for a long period of time Relieving factors: rest and elevate, ice  PRECAUTIONS: None  WEIGHT BEARING RESTRICTIONS: No  FALLS:  Has  patient fallen in last 6 months? No  LIVING ENVIRONMENT: Lives with: lives with their spouse Lives in: House/apartment Stairs: Yes: External: 4 steps; on left going up Has following equipment at home: Single point cane and shower chair  OCCUPATION: handyman, carpentry type things  PLOF: Independent  PATIENT GOALS: get my knees stronger  NEXT MD VISIT: unsure but sees PCP regularly  OBJECTIVE:   DIAGNOSTIC FINDINGS: x-rays in 2020 moderate to severe arthritis both knees  PATIENT SURVEYS:  FOTO 55  COGNITION: Overall cognitive status: Within functional limits for tasks assessed     SENSATION: WFL  EDEMA:  None noted   POSTURE:  wide bos  PALPATION: Tenderness right knee lateral to patella  LOWER EXTREMITY ROM:  Active ROM Right eval Left eval Right 12/23/22 Left 12/23/22  Hip flexion      Hip extension      Hip abduction      Hip adduction      Hip internal rotation      Hip external rotation      Knee flexion 115 114 114 112  Knee extension -8 -4 -5 -2  Ankle dorsiflexion      Ankle plantarflexion      Ankle inversion      Ankle eversion       (Blank rows = not tested)  LOWER EXTREMITY MMT:  MMT Right eval Left eval Right 12/02/22 Left 12/02/22 Right 12/23/22 Left 12/23/22  Hip flexion 4+ 4+   4+ 4+  Hip extension   4 4    Hip abduction   4 4    Hip adduction        Hip internal rotation        Hip external rotation        Knee flexion   4 4    Knee extension 4+ 4   4+ 4+  Ankle dorsiflexion 5 4+   5 5  Ankle plantarflexion        Ankle inversion        Ankle eversion         (Blank rows = not tested)  FUNCTIONAL TESTS:  5 times sit to stand: 27.28 sec using hands on thighs to push up to standing 2 minute walk test: 1/9: 226 feet with SPC  GAIT: Distance walked: 50 ft Assistive device utilized: None Level of assistance: Modified independence Comments: wide BOS; slow antalgic gait speed   TODAY'S TREATMENT:  DATE:  12/23/22 Progress note 5 x sit to stand 51.95 sec 2 MWT 161 ft FOTO 39 MMT's and AROM    12/16/22 Standing: Heel raises 2 x 10 Knee drives on 6" box for flexion x 2' each Hip abduction 2 x 10 Slant board 10 x 10" Sit to stand no UE assist 2 x 5  Nustep seat 8; arms 8 x 5' for knee ROM     12/02/22 Supine:  Bridge 10X SLR 10X each   Seated:  STS no UE 10X Standing: hip abduction 10X  Hip extension 10X  Knee flexion 10X Nustep seat 8 LE only 5 minutes level 1 at EOS  11/24/22 Goal review 226 feet with SPC Seated: LAQ 10X each   Sit to stands no UE 2X5 Supine:  HEP review QS, heelslides 10X each  Bridge 2X5  Evaluation:  physical therapy evaluation and HEP instruction    PATIENT EDUCATION:  Education details: 12/02/22: see below assessment; importance of completing therex and how this relates to his c/o stiffness and LE's giving way.  Evaluation:  Patient educated on exam findings, POC, scope of PT, HEP. Person educated: Patient Education method: Explanation, Demonstration, and Handouts Education comprehension: verbalized understanding, returned demonstration, verbal cues required, and tactile cues required  HOME EXERCISE PROGRAM: Access Code: [email or web address] URL: [email or web address] Date: 11/11/2022 Prepared by: AP - Rehab Exercises - Supine Quad Set  - 1 x daily - 7 x weekly - 3 sets - 10 reps - Supine Heel Slide  - 1 x daily - 7 x weekly - 3 sets - 10 reps  Access Code: [email or web address] URL: [email or web address] Date: 12/02/2022 Prepared by: Emeline Gins Exercises - Supine Bridge  - 2 x daily - 7 x weekly - 2 sets - 10 reps - Supine Active Straight Leg Raise  - 2 x daily - 7 x weekly - 2 sets - 10 reps - Sit to Stand Without Arm Support  - 2 x daily - 7 x weekly - 1 sets - 10 reps - Standing Hip Abduction AROM  - 2 x daily - 7 x weekly  - 2 sets - 10 reps - Standing Hip Extension  - 2 x daily - 7 x weekly - 2 sets - 10 reps - Standing Knee Flexion  - 2 x daily - 7 x weekly - 3 sets - 10 reps ASSESSMENT:  CLINICAL IMPRESSION: Late arrival today.  Patient walks in with his [facility] today; has subjective complaints of increased pain due exercise and cold weather.  Progress note today.  Noted decline with all functional testing today; FOTO, 2 MWT and 5 x sit to stand.  He shows some mild improvement with strength but minimal change with ROM and has overall increased pain complaint in his knees; left knee pain > right knee pain.  Patient agreeable to discharge today due to lack of progress.  Discussed with patient possible knee replacement?    OBJECTIVE IMPAIRMENTS: Abnormal gait, decreased activity tolerance, decreased balance, decreased endurance, decreased knowledge of use of DME, decreased mobility, difficulty walking, decreased ROM, decreased strength, hypomobility, increased fascial restrictions, impaired perceived functional ability, increased muscle spasms, impaired flexibility, improper body mechanics, and pain.   ACTIVITY LIMITATIONS: carrying, lifting, bending, sitting, standing, squatting, sleeping, stairs, transfers, bed mobility, locomotion level, and caring for others  PARTICIPATION LIMITATIONS: meal prep, cleaning, laundry, shopping, community activity, occupation, and yard work  PERSONAL FACTORS: Time since onset of injury/illness/exacerbation are also affecting patient's functional outcome.   REHAB POTENTIAL: Good  CLINICAL DECISION MAKING: Stable/uncomplicated  EVALUATION COMPLEXITY: Low   GOALS: Goals reviewed with patient? Yes  SHORT TERM GOALS: Target date: 12/02/2021 patient will be independent with initial HEP Baseline: Goal status: MET  2.  Patient will improve bilateral knee extension to -2 Baseline: see above; left knee -2; right knee -5 Goal status: partially met    LONG TERM GOALS: Target  date: 12/23/2021  Patient will be independent in self management strategies to improve quality of life and functional outcomes. Baseline:  Goal status: IN PROGRESS  2.  Patient will improve FOTO score to predicted value Baseline: 55; declined 12/23/22 Goal status: IN PROGRESS  3.  Patient will increase  leg MMTs to 5/5 without pain to promote return to ambulation community distances with minimal deviation. Baseline: see above Goal status: IN PROGRESS  4.   Patient will report at least 50% improvement in overall symptoms and/or function to demonstrate improved functional mobility Baseline:  Goal status: IN PROGRESS  5.  Patient will improve 5 times sit to stand score from 27.28 sec to 22 sec to demonstrate improved functional mobility and increased lower extremity strength.  Baseline: 27.28 sec; declined 12/23/22 Goal status: IN PROGRESS   PLAN:  PT FREQUENCY: 1x/week  PT DURATION: 6 weeks  PLANNED INTERVENTIONS: Therapeutic exercises, Therapeutic activity, Neuromuscular re-education, Balance training, Gait training, Patient/Family education, Joint manipulation, Joint mobilization, Stair training, Orthotic/Fit training, DME instructions, Aquatic Therapy, Dry Needling, Electrical stimulation, Spinal manipulation, Spinal mobilization, Cryotherapy, Moist heat, Compression bandaging, scar mobilization, Splintting, Taping, Traction, Ultrasound, Ionotophoresis 4mg /ml Dexamethasone, and Manual therapy  PLAN FOR NEXT SESSION: discharge; sees MD 04/13/23   9:44 AM, 12/23/22 Angella Montas Small Edward Guthmiller MPT [facility] [place] [phone] [phone]

## 2022-12-24 ENCOUNTER — Other Ambulatory Visit: Payer: Self-pay

## 2022-12-25 ENCOUNTER — Other Ambulatory Visit: Payer: Self-pay

## 2022-12-25 DIAGNOSIS — M9903 Segmental and somatic dysfunction of lumbar region: Secondary | ICD-10-CM | POA: Diagnosis not present

## 2022-12-25 DIAGNOSIS — M9902 Segmental and somatic dysfunction of thoracic region: Secondary | ICD-10-CM | POA: Diagnosis not present

## 2022-12-25 DIAGNOSIS — M9901 Segmental and somatic dysfunction of cervical region: Secondary | ICD-10-CM | POA: Diagnosis not present

## 2022-12-25 DIAGNOSIS — M546 Pain in thoracic spine: Secondary | ICD-10-CM | POA: Diagnosis not present

## 2022-12-25 DIAGNOSIS — M542 Cervicalgia: Secondary | ICD-10-CM | POA: Diagnosis not present

## 2022-12-28 DIAGNOSIS — M9901 Segmental and somatic dysfunction of cervical region: Secondary | ICD-10-CM | POA: Diagnosis not present

## 2022-12-28 DIAGNOSIS — M542 Cervicalgia: Secondary | ICD-10-CM | POA: Diagnosis not present

## 2022-12-28 DIAGNOSIS — M546 Pain in thoracic spine: Secondary | ICD-10-CM | POA: Diagnosis not present

## 2022-12-28 DIAGNOSIS — M9902 Segmental and somatic dysfunction of thoracic region: Secondary | ICD-10-CM | POA: Diagnosis not present

## 2022-12-28 DIAGNOSIS — M9903 Segmental and somatic dysfunction of lumbar region: Secondary | ICD-10-CM | POA: Diagnosis not present

## 2022-12-30 ENCOUNTER — Encounter (HOSPITAL_COMMUNITY): Payer: Medicare HMO

## 2023-01-04 DIAGNOSIS — M546 Pain in thoracic spine: Secondary | ICD-10-CM | POA: Diagnosis not present

## 2023-01-04 DIAGNOSIS — M9902 Segmental and somatic dysfunction of thoracic region: Secondary | ICD-10-CM | POA: Diagnosis not present

## 2023-01-04 DIAGNOSIS — M9903 Segmental and somatic dysfunction of lumbar region: Secondary | ICD-10-CM | POA: Diagnosis not present

## 2023-01-04 DIAGNOSIS — M542 Cervicalgia: Secondary | ICD-10-CM | POA: Diagnosis not present

## 2023-01-04 DIAGNOSIS — M9901 Segmental and somatic dysfunction of cervical region: Secondary | ICD-10-CM | POA: Diagnosis not present

## 2023-01-07 ENCOUNTER — Ambulatory Visit (INDEPENDENT_AMBULATORY_CARE_PROVIDER_SITE_OTHER): Payer: Medicare HMO | Admitting: Internal Medicine

## 2023-01-07 ENCOUNTER — Ambulatory Visit (INDEPENDENT_AMBULATORY_CARE_PROVIDER_SITE_OTHER)
Admission: RE | Admit: 2023-01-07 | Discharge: 2023-01-07 | Disposition: A | Discharge status: Home / Self Care | Payer: Medicare HMO | Source: Ambulatory Visit | Attending: Internal Medicine | Admitting: Internal Medicine

## 2023-01-07 ENCOUNTER — Encounter: Payer: Self-pay | Admitting: Internal Medicine

## 2023-01-07 VITALS — BP 138/72 | HR 114 | Temp 101.6°F | Ht 69.0 in | Wt 205.0 lb

## 2023-01-07 DIAGNOSIS — R42 Dizziness and giddiness: Secondary | ICD-10-CM | POA: Diagnosis not present

## 2023-01-07 DIAGNOSIS — R509 Fever, unspecified: Secondary | ICD-10-CM | POA: Diagnosis not present

## 2023-01-07 DIAGNOSIS — J22 Unspecified acute lower respiratory infection: Secondary | ICD-10-CM | POA: Insufficient documentation

## 2023-01-07 DIAGNOSIS — R0602 Shortness of breath: Secondary | ICD-10-CM | POA: Diagnosis not present

## 2023-01-07 DIAGNOSIS — R059 Cough, unspecified: Secondary | ICD-10-CM | POA: Diagnosis not present

## 2023-01-07 LAB — POCT INFLUENZA A/B
Influenza A, POC: NEGATIVE
Influenza B, POC: NEGATIVE

## 2023-01-07 LAB — POC COVID19 BINAXNOW: SARS Coronavirus 2 Ag: NEGATIVE

## 2023-01-07 MED ORDER — AZITHROMYCIN 250 MG PO TABS: ORAL_TABLET | ORAL | 0 refills | Status: DC

## 2023-01-07 NOTE — Progress Notes (Signed)
Subjective:    Patient ID: Brian Mullen, male    DOB: 1946/04/30, 77 y.o.   MRN: [id number]  HPI Here due to respiratory symptoms With wife  Has been SOB off and on in the past week---like walking in yard This morning--woke with dry cough (non productive) Went out to store---then walked into store and got dizzy (which has happened since recent eye surgery) Didn't feel right so went home Having fever Has sense of some pressure on chest ---all week Dry cough Some chills earlier today  Took day cold med---didn't help  Current Outpatient Medications on File Prior to Visit  Medication Sig Dispense Refill   ACCU-CHEK AVIVA PLUS test strip USE AS INSTRUCTED TO TEST BLOOD SUGAR 2 TIMES DAILY 200 strip 0   albuterol (VENTOLIN HFA) 108 (90 Base) MCG/ACT inhaler INHALE 1 TO 2 PUFFS EVERY 4 TO 6 HOURS AS NEEDED 1 each 0   Alcohol Swabs (B-D SINGLE USE SWABS REGULAR) PADS Use to check blood sugars. 200 each 3   Blood Glucose Monitoring Suppl (ONE TOUCH ULTRA 2) w/Device KIT Use as instructed to blood sugar 2 times daily. 1 each 0   fexofenadine (ALLEGRA) 180 MG tablet Take 1 tablet (180 mg total) by mouth daily. For allergies 90 tablet 1   fluticasone (FLONASE) 50 MCG/ACT nasal spray PLACE 1 SPRAY INTO BOTH NOSTRILS 2   TIMES DAILY. 48 g 1   Lancets (ONETOUCH ULTRASOFT) lancets Use as instructed to test blood sugar 2 times daily. 100 each 5   latanoprost (XALATAN) 0.005 % ophthalmic solution SMARTSIG:1 Drop(s) In Eye(s) Every Evening     metFORMIN (GLUCOPHAGE) 500 MG tablet TAKE 2 TABLETS (1,000 MG TOTAL) BY MOUTH 2 (TWO) TIMES DAILY WITH A MEAL FOR DIABETES 360 tablet 1   metoprolol succinate (TOPROL-XL) 25 MG 24 hr tablet Take 1 tablet (25 mg total) by mouth daily. for high blood pressure 90 tablet 3   montelukast (SINGULAIR) 10 MG tablet TAKE 1 TABLET EVERY DAY AT BEDTIME FOR ALLERGIES/ASTHMA 90 tablet 0   omeprazole (PRILOSEC) 20 MG capsule Take 40 mg by mouth daily.     tamsulosin (FLOMAX)  0.4 MG CAPS capsule Take 1 capsule (0.4 mg total) by mouth in the morning and at bedtime. 180 capsule 0   tiotropium (SPIRIVA HANDIHALER) 18 MCG inhalation capsule Place 1 capsule (18 mcg total) into inhaler and inhale daily. 30 capsule 2   tiZANidine (ZANAFLEX) 4 MG tablet TAKE 1 TABLET AT BEDTIME AS NEEDED FOR MUSCLE SPASM(S) 30 tablet 0   No current facility-administered medications on file prior to visit.    Allergies  Allergen Reactions   Coconut (Cocos Nucifera) Anaphylaxis   Iodine Anaphylaxis   Shellfish Allergy Anaphylaxis   Shellfish-Derived Products Anaphylaxis   Amlodipine Cough    LE Swelling    Past Medical History:  Diagnosis Date   Allergy    Asthma    Diabetes mellitus without complication ([facility])    GERD (gastroesophageal reflux disease)    Hypertension    Palpitations    a. 10/2019: Monitor showing rare PVC's and no significant arrhythmias.     Past Surgical History:  Procedure Laterality Date   CATARACT EXTRACTION Bilateral    02/23/2022, 03/28/2022   ELBOW SURGERY     KNEE SURGERY Right    TONSILLECTOMY      Family History  Problem Relation Age of Onset   Pulmonary embolism Father 6       Deceased   Heart attack Mother 26  Deceased   Diabetes Maternal Grandmother    Heart attack Sister     Social History   Socioeconomic History   Marital status: Widowed    Spouse name: Not on file   Number of children: Not on file   Years of education: Not on file   Highest education level: Not on file  Occupational History   Not on file  Tobacco Use   Smoking status: Former    Packs/day: 4.00    Years: 12.00    Total pack years: 48.00    Types: Cigarettes    Quit date: 84    Years since quitting: 48.1   Smokeless tobacco: Never   Tobacco comments:    used to smoke Lucky's  Vaping Use   Vaping Use: Never used  Substance and Sexual Activity   Alcohol use: Yes    Alcohol/week: 1.0 - 2.0 standard drink of alcohol    Types: 1 - 2 Standard  drinks or equivalent per week    Comment: occ   Drug use: No   Sexual activity: Not on file  Other Topics Concern   Not on file  Social History Narrative   Married.   No children   Retried. Worked as a Furniture conservator/restorer.   Enjoys working on [employer], yard work, gardening.   Right handed    Social Determinants of Health   Financial Resource Strain: Low Risk  (09/29/2022)   Overall Financial Resource Strain (CARDIA)    Difficulty of Paying Living Expenses: Not hard at all  Food Insecurity: No Food Insecurity (09/29/2022)   Hunger Vital Sign    Worried About Running Out of Food in the Last Year: Never true    Ran Out of Food in the Last Year: Never true  Transportation Needs: No Transportation Needs (09/29/2022)   PRAPARE - Hydrologist (Medical): No    Lack of Transportation (Non-Medical): No  Physical Activity: Inactive (09/29/2022)   Exercise Vital Sign    Days of Exercise per Week: 0 days    Minutes of Exercise per Session: 0 min  Stress: No Stress Concern Present (09/29/2022)   [place]    Feeling of Stress : Not at all  Social Connections: Not on file  Intimate Partner Violence: Not on file   Review of Systems No N/V Eating okay    Objective:   Physical Exam Constitutional:      Appearance: Normal appearance.  HENT:     Head:     Comments: No sinus tenderness    Mouth/Throat:     Pharynx: No oropharyngeal exudate or posterior oropharyngeal erythema.  Cardiovascular:     Rate and Rhythm: Normal rate and regular rhythm.     Heart sounds: No murmur heard.    No gallop.  Pulmonary:     Effort: Pulmonary effort is normal.     Breath sounds: No wheezing.     Comments: Decreased breath sounds and crackles RLL Musculoskeletal:     Cervical back: Neck supple.  Lymphadenopathy:     Cervical: No cervical adenopathy.  Neurological:     Mental Status: He is  alert.            Assessment & Plan:

## 2023-01-07 NOTE — Assessment & Plan Note (Addendum)
Flu and COVID tests negative Highly suspicious for bacterial pneumonia Will check CXR  CXR negative Could be viral (like RSV) but will give z-pak in case this could be bacterial process

## 2023-01-10 ENCOUNTER — Emergency Department (HOSPITAL_COMMUNITY): Payer: Medicare HMO

## 2023-01-10 ENCOUNTER — Emergency Department (HOSPITAL_COMMUNITY)
Admission: EM | Admit: 2023-01-10 | Discharge: 2023-01-10 | Disposition: A | Discharge status: Home / Self Care | Payer: Medicare HMO | Attending: Student | Admitting: Student

## 2023-01-10 ENCOUNTER — Encounter (HOSPITAL_COMMUNITY): Payer: Self-pay

## 2023-01-10 DIAGNOSIS — Z87891 Personal history of nicotine dependence: Secondary | ICD-10-CM | POA: Insufficient documentation

## 2023-01-10 DIAGNOSIS — E119 Type 2 diabetes mellitus without complications: Secondary | ICD-10-CM | POA: Diagnosis not present

## 2023-01-10 DIAGNOSIS — R0602 Shortness of breath: Secondary | ICD-10-CM | POA: Diagnosis not present

## 2023-01-10 DIAGNOSIS — J111 Influenza due to unidentified influenza virus with other respiratory manifestations: Secondary | ICD-10-CM

## 2023-01-10 DIAGNOSIS — Z7951 Long term (current) use of inhaled steroids: Secondary | ICD-10-CM | POA: Insufficient documentation

## 2023-01-10 DIAGNOSIS — I10 Essential (primary) hypertension: Secondary | ICD-10-CM | POA: Diagnosis not present

## 2023-01-10 DIAGNOSIS — J101 Influenza due to other identified influenza virus with other respiratory manifestations: Secondary | ICD-10-CM | POA: Insufficient documentation

## 2023-01-10 DIAGNOSIS — J45909 Unspecified asthma, uncomplicated: Secondary | ICD-10-CM | POA: Diagnosis not present

## 2023-01-10 DIAGNOSIS — Z7984 Long term (current) use of oral hypoglycemic drugs: Secondary | ICD-10-CM | POA: Diagnosis not present

## 2023-01-10 DIAGNOSIS — R000 Tachycardia, unspecified: Secondary | ICD-10-CM | POA: Diagnosis not present

## 2023-01-10 DIAGNOSIS — Z1152 Encounter for screening for COVID-19: Secondary | ICD-10-CM | POA: Diagnosis not present

## 2023-01-10 LAB — CBC WITH DIFFERENTIAL/PLATELET
Abs Immature Granulocytes: 0.03 10*3/uL (ref 0.00–0.07)
Basophils Absolute: 0 10*3/uL (ref 0.0–0.1)
Basophils Relative: 1 %
Eosinophils Absolute: 0.1 10*3/uL (ref 0.0–0.5)
Eosinophils Relative: 1 %
HCT: 36.4 % — ABNORMAL LOW (ref 39.0–52.0)
Hemoglobin: 12 g/dL — ABNORMAL LOW (ref 13.0–17.0)
Immature Granulocytes: 0 %
Lymphocytes Relative: 10 %
Lymphs Abs: 0.8 10*3/uL (ref 0.7–4.0)
MCH: 28.8 pg (ref 26.0–34.0)
MCHC: 33 g/dL (ref 30.0–36.0)
MCV: 87.3 fL (ref 80.0–100.0)
Monocytes Absolute: 0.8 10*3/uL (ref 0.1–1.0)
Monocytes Relative: 10 %
Neutro Abs: 6.5 10*3/uL (ref 1.7–7.7)
Neutrophils Relative %: 78 %
Platelets: 305 10*3/uL (ref 150–400)
RBC: 4.17 MIL/uL — ABNORMAL LOW (ref 4.22–5.81)
RDW: 14.1 % (ref 11.5–15.5)
WBC: 8.2 10*3/uL (ref 4.0–10.5)
nRBC: 0 % (ref 0.0–0.2)

## 2023-01-10 LAB — COMPREHENSIVE METABOLIC PANEL
ALT: 15 U/L (ref 0–44)
AST: 21 U/L (ref 15–41)
Albumin: 3.5 g/dL (ref 3.5–5.0)
Alkaline Phosphatase: 46 U/L (ref 38–126)
Anion gap: 9 (ref 5–15)
BUN: 10 mg/dL (ref 8–23)
CO2: 23 mmol/L (ref 22–32)
Calcium: 7.9 mg/dL — ABNORMAL LOW (ref 8.9–10.3)
Chloride: 98 mmol/L (ref 98–111)
Creatinine, Ser: 0.91 mg/dL (ref 0.61–1.24)
GFR, Estimated: >60 mL/min (ref >60)
Glucose, Bld: 224 mg/dL — ABNORMAL HIGH (ref 70–99)
Potassium: 3.5 mmol/L (ref 3.5–5.1)
Sodium: 130 mmol/L — ABNORMAL LOW (ref 135–145)
Total Bilirubin: 0.3 mg/dL (ref 0.3–1.2)
Total Protein: 6.3 g/dL — ABNORMAL LOW (ref 6.5–8.1)

## 2023-01-10 LAB — BRAIN NATRIURETIC PEPTIDE: B Natriuretic Peptide: 56 pg/mL (ref 0.0–100.0)

## 2023-01-10 LAB — RESP PANEL BY RT-PCR (RSV, FLU A&B, COVID)  RVPGX2
Influenza A by PCR: POSITIVE — AB
Influenza B by PCR: NEGATIVE
Resp Syncytial Virus by PCR: NEGATIVE
SARS Coronavirus 2 by RT PCR: NEGATIVE

## 2023-01-10 LAB — TROPONIN I (HIGH SENSITIVITY)
Troponin I (High Sensitivity): 3 ng/L (ref <18)
Troponin I (High Sensitivity): 3 ng/L (ref <18)

## 2023-01-10 NOTE — ED Triage Notes (Signed)
Pt went to PCP last Thursday for a cough, was prescribed antibiotic but not getting any better. Cough is still there but this morning had difficulty breathing and heart rate was between 100-130.

## 2023-01-10 NOTE — Discharge Instructions (Signed)
Drink 20 of fluids.  Take Tylenol or Motrin.  Use your inhalers as prescribed and follow-up with your doctor if any problems

## 2023-01-10 NOTE — ED Provider Notes (Signed)
[facility] Provider Note  CSN: [id number] Arrival date & time: 01/10/23 0602  Chief Complaint(s) Shortness of Breath  HPI Brian Mullen is a 77 y.o. male with PMH asthma, T2DM, HTN who presents emergency room for evaluation of shortness of breath.  Patient states that patient was seen by his primary care physician 3 days ago and had a negative x-ray with negative COVID and flu testing.  Patient then prescribed azithromycin (?)  With no improvement of his cough.  Patient endorsing shortness of breath and cough, stating that he is now significant more winded with exertion which is abnormal for him.  Denies chest pain, abdominal pain, nausea, vomiting, fever or other systemic symptoms.   Past Medical History Past Medical History:  Diagnosis Date   Allergy    Asthma    Diabetes mellitus without complication (HCC)    GERD (gastroesophageal reflux disease)    Hypertension    Palpitations    a. 10/2019: Monitor showing rare PVC's and no significant arrhythmias.    Patient Active Problem List   Diagnosis Date Noted   Lower respiratory infection 01/07/2023   Anemia 10/13/2022   Imbalance 01/12/2022   Type 2 diabetes mellitus with hyperglycemia ([place]) 09/30/2021   Knee pain, bilateral 09/30/2021   Leg edema 09/02/2021   Cellulitis of left upper extremity 06/27/2021   Persistent cough for 3 weeks or longer 05/20/2021   Sensation of fullness in both ears 05/20/2021   Lightheadedness 05/20/2021   Chronic right-sided thoracic back pain 09/06/2020   Leg cramping 03/19/2020   Urinary urgency 03/19/2020   Hyperlipidemia associated with type 2 diabetes mellitus ([place]) 09/13/2019   PVC's (premature ventricular contractions) 09/13/2019   Palpitations 09/08/2019   Presbycusis of both ears 06/22/2019   Tinnitus of both ears 03/08/2019   Testosterone deficiency 07/15/2018   Left shoulder pain 01/21/2016   Medicare annual wellness visit, subsequent  10/07/2015   Insomnia, psychophysiological 10/07/2015   Encounter for general adult medical examination without abnormal findings 10/07/2015   Asthma 03/14/2015   Lumbar radiculopathy, chronic 09/22/2013   Hypertension associated with diabetes ([place]) 08/18/2013   Home Medication(s) Prior to Admission medications   Medication Sig Start Date End Date Taking? Authorizing Provider  ACCU-CHEK AVIVA PLUS test strip USE AS INSTRUCTED TO TEST BLOOD SUGAR 2 TIMES DAILY 09/18/20   Pleas Koch, NP  albuterol (VENTOLIN HFA) 108 (90 Base) MCG/ACT inhaler INHALE 1 TO 2 PUFFS EVERY 4 TO 6 HOURS AS NEEDED 08/13/22   Pleas Koch, NP  Alcohol Swabs (B-D SINGLE USE SWABS REGULAR) PADS Use to check blood sugars. 07/11/21   Pleas Koch, NP  azithromycin (ZITHROMAX Z-PAK) 250 MG tablet Take 2 tablets (500 mg) on  Day 1,  followed by 1 tablet (250 mg) once daily on Days 2 through 5. 01/07/23   Venia Carbon, MD  Blood Glucose Monitoring Suppl (ONE TOUCH ULTRA 2) w/Device KIT Use as instructed to blood sugar 2 times daily. 04/20/17   Pleas Koch, NP  fexofenadine (ALLEGRA) 180 MG tablet Take 1 tablet (180 mg total) by mouth daily. For allergies 12/18/22   Pleas Koch, NP  fluticasone (FLONASE) 50 MCG/ACT nasal spray PLACE 1 SPRAY INTO BOTH NOSTRILS 2   TIMES DAILY. 08/13/22   Pleas Koch, NP  Lancets [facility] ULTRASOFT) lancets Use as instructed to test blood sugar 2 times daily. 04/20/17   Pleas Koch, NP  latanoprost (XALATAN) 0.005 % ophthalmic solution SMARTSIG:1 Drop(s)  In Eye(s) Every Evening 11/21/21   [provider]  metFORMIN (GLUCOPHAGE) 500 MG tablet TAKE 2 TABLETS (1,000 MG TOTAL) BY MOUTH 2 (TWO) TIMES DAILY WITH A MEAL FOR DIABETES 04/21/22   Pleas Koch, NP  metoprolol succinate (TOPROL-XL) 25 MG 24 hr tablet Take 1 tablet (25 mg total) by mouth daily. for high blood pressure 12/31/21   Pleas Koch, NP  montelukast (SINGULAIR) 10 MG tablet TAKE  1 TABLET EVERY DAY AT BEDTIME FOR ALLERGIES/ASTHMA 10/26/22   Copland, Frederico Hamman, MD  omeprazole (PRILOSEC) 20 MG capsule Take 40 mg by mouth daily.    [provider]  tamsulosin (FLOMAX) 0.4 MG CAPS capsule Take 1 capsule (0.4 mg total) by mouth in the morning and at bedtime. 10/26/22   Copland, Frederico Hamman, MD  tiotropium (SPIRIVA HANDIHALER) 18 MCG inhalation capsule Place 1 capsule (18 mcg total) into inhaler and inhale daily. 09/06/20   Pleas Koch, NP  tiZANidine (ZANAFLEX) 4 MG tablet TAKE 1 TABLET AT BEDTIME AS NEEDED FOR MUSCLE SPASM(S) 03/20/22   Pleas Koch, NP                                                                                                                                    Past Surgical History Past Surgical History:  Procedure Laterality Date   CATARACT EXTRACTION Bilateral    02/23/2022, 03/28/2022   ELBOW SURGERY     KNEE SURGERY Right    TONSILLECTOMY     Family History Family History  Problem Relation Age of Onset   Pulmonary embolism Father 80       Deceased   Heart attack Mother 47       Deceased   Diabetes Maternal Grandmother    Heart attack Sister     Social History Social History   Tobacco Use   Smoking status: Former    Packs/day: 4.00    Years: 12.00    Total pack years: 48.00    Types: Cigarettes    Quit date: 1976    Years since quitting: 48.1   Smokeless tobacco: Never   Tobacco comments:    used to smoke [employer]  Vaping Use   Vaping Use: Never used  Substance Use Topics   Alcohol use: Not Currently    Alcohol/week: 1.0 - 2.0 standard drink of alcohol    Types: 1 - 2 Standard drinks or equivalent per week    Comment: occ   Drug use: No   Allergies Coconut (cocos nucifera), Iodine, Shellfish allergy, Shellfish-derived products, and Amlodipine  Review of Systems Review of Systems  Respiratory:  Positive for cough and shortness of breath.     Physical Exam Vital Signs  I have reviewed the triage vital  signs BP 134/62   Pulse 80   Temp 98.4 F (36.9 C) (Oral)   Resp 11   Ht '5\' 9"'$  (1.753 m)   Wt 90.7 kg  SpO2 96%   BMI 29.53 kg/m   Physical Exam Vitals and nursing note reviewed.  Constitutional:      General: He is not in acute distress.    Appearance: He is well-developed.  HENT:     Head: Normocephalic and atraumatic.  Eyes:     Conjunctiva/sclera: Conjunctivae normal.  Cardiovascular:     Rate and Rhythm: Normal rate and regular rhythm.     Heart sounds: No murmur heard. Pulmonary:     Effort: Pulmonary effort is normal. No respiratory distress.     Breath sounds: Wheezing present.  Abdominal:     Palpations: Abdomen is soft.     Tenderness: There is no abdominal tenderness.  Musculoskeletal:        General: No swelling.     Cervical back: Neck supple.  Skin:    General: Skin is warm and dry.     Capillary Refill: Capillary refill takes less than 2 seconds.  Neurological:     Mental Status: He is alert.  Psychiatric:        Mood and Affect: Mood normal.     ED Results and Treatments Labs (all labs ordered are listed, but only abnormal results are displayed) Labs Reviewed  CBC WITH DIFFERENTIAL/PLATELET - Abnormal; Notable for the following components:      Result Value   RBC 4.17 (*)    Hemoglobin 12.0 (*)    HCT 36.4 (*)    All other components within normal limits  RESP PANEL BY RT-PCR (RSV, FLU A&B, COVID)  RVPGX2  COMPREHENSIVE METABOLIC PANEL  BRAIN NATRIURETIC PEPTIDE  TROPONIN I (HIGH SENSITIVITY)                                                                                                                          Radiology DG Chest Port 1 View  Result Date: 01/10/2023 CLINICAL DATA:  77 year old male with shortness of breath and tachycardia. EXAM: PORTABLE CHEST 1 VIEW COMPARISON:  Chest radiographs 01/07/2023 and earlier. FINDINGS: Portable AP upright views at 0635 hours. Lung volumes and mediastinal contours remain normal. Visualized  tracheal air column is within normal limits. Allowing for portable technique the lungs are clear. No pneumothorax or pleural effusion. No acute osseous abnormality identified. Negative visible bowel gas in the left upper quadrant. IMPRESSION: Negative portable chest. Electronically Signed   By: Genevie Ann M.D.   On: 01/10/2023 06:46    Pertinent labs & imaging results that were available during my care of the patient were reviewed by me and considered in my medical decision making (see MDM for details).  Medications Ordered in ED Medications - No data to display  Procedures Procedures  (including critical care time)  Medical Decision Making / ED Course   This patient presents to the ED for concern of cough, shortness of breath, this involves an extensive number of treatment options, and is a complaint that carries with it a high risk of complications and morbidity.  The differential diagnosis includes Pe, PTX, Pulmonary Edema, ARDS, COPD/Asthma, ACS, CHF exacerbation, Arrhythmia, Pericardial Effusion/Tamponade, Anemia, Sepsis, Acidosis/Hypercapnia, Anxiety, Viral URI  MDM: Patient seen emergency room for evaluation of cough and shortness of breath.  Physical exam with very faint wheezing but is otherwise unremarkable.  At time of signout, patient pending laboratory evaluation.  Please see provider signout for continuation of workup.  Chest x-ray is unremarkable.   Additional history obtained: -Additional history obtained from wife -External records from outside source obtained and reviewed including: Chart review including previous notes, labs, imaging, consultation notes   Lab Tests: -I ordered, reviewed, and interpreted labs.   The pertinent results include:   Labs Reviewed  CBC WITH DIFFERENTIAL/PLATELET - Abnormal; Notable for the following components:       Result Value   RBC 4.17 (*)    Hemoglobin 12.0 (*)    HCT 36.4 (*)    All other components within normal limits  RESP PANEL BY RT-PCR (RSV, FLU A&B, COVID)  RVPGX2  COMPREHENSIVE METABOLIC PANEL  BRAIN NATRIURETIC PEPTIDE  TROPONIN I (HIGH SENSITIVITY)      EKG   EKG Interpretation  Date/Time:  Sunday January 10 2023 06:23:35 EST Ventricular Rate:  88 PR Interval:  133 QRS Duration: 102 QT Interval:  383 QTC Calculation: 464 R Axis:   74 Text Interpretation: Sinus rhythm Confirmed by Miyani Cronic ([id number]) on 01/10/2023 7:28:03 AM         Imaging Studies ordered: I ordered imaging studies including CXR I independently visualized and interpreted imaging. I agree with the radiologist interpretation   Medicines ordered and prescription drug management: No orders of the defined types were placed in this encounter.   -I have reviewed the patients home medicines and have made adjustments as needed  Critical interventions none   Cardiac Monitoring: The patient was maintained on a cardiac monitor.  I personally viewed and interpreted the cardiac monitored which showed an underlying rhythm of: NSR  Social Determinants of Health:  Factors impacting patients care include: none   Reevaluation: After the interventions noted above, I reevaluated the patient and found that they have :stayed the same  Co morbidities that complicate the patient evaluation  Past Medical History:  Diagnosis Date   Allergy    Asthma    Diabetes mellitus without complication ([place])    GERD (gastroesophageal reflux disease)    Hypertension    Palpitations    a. 10/2019: Monitor showing rare PVC's and no significant arrhythmias.       Dispostion: I considered admission for this patient, and disposition pending laboratory evaluation.  Please see provider signout for continuation of workup     Final Clinical Impression(s) / ED Diagnoses Final diagnoses:  None     '@PCDICTATION'$ @     Parisha Beaulac, Debe Coder, MD 01/10/23 [id number]

## 2023-01-11 ENCOUNTER — Other Ambulatory Visit: Payer: Self-pay | Admitting: Family Medicine

## 2023-01-11 DIAGNOSIS — J4540 Moderate persistent asthma, uncomplicated: Secondary | ICD-10-CM

## 2023-01-11 DIAGNOSIS — R3915 Urgency of urination: Secondary | ICD-10-CM

## 2023-01-15 ENCOUNTER — Telehealth: Payer: Self-pay | Admitting: *Deleted

## 2023-01-15 NOTE — Telephone Encounter (Signed)
        Patient  visited [facility] on 01/10/2023  for treatment    Un working phone number   [facility] [phone] [address] , [place] [address] Email : [email or web address]

## 2023-02-04 ENCOUNTER — Ambulatory Visit: Payer: Medicare HMO | Attending: Internal Medicine | Admitting: Internal Medicine

## 2023-02-04 ENCOUNTER — Encounter: Payer: Self-pay | Admitting: Internal Medicine

## 2023-02-04 VITALS — BP 132/68 | HR 72 | Ht 69.0 in | Wt 204.8 lb

## 2023-02-04 DIAGNOSIS — E785 Hyperlipidemia, unspecified: Secondary | ICD-10-CM | POA: Diagnosis not present

## 2023-02-04 DIAGNOSIS — E1169 Type 2 diabetes mellitus with other specified complication: Secondary | ICD-10-CM

## 2023-02-04 NOTE — Progress Notes (Signed)
Cardiology Office Note  Date: 02/04/2023   ID: Brian Mullen, DOB November 16, 1946, MRN [id number]  PCP:  Pleas Koch, NP  Cardiologist:  Werner Lean, MD Electrophysiologist:  None   Reason for Office Visit: Follow-up of palpitations/chest pain   History of Present Illness: Brian Mullen is a 77 y.o. male known to have DM 2, HLD presented to the cardiology clinic for follow-up visit.  He was initially referred to cardiology clinic for evaluation of palpitations associated with chest pain. Event monitor was unremarkable. His symptoms resolved with metoprolol succinate 25 mg once daily. He is here for follow-up visit, accompanied by wife.  He denies any symptoms of palpitations, chest pain, SOB, dizziness, lightheadedness, syncope, LE swelling. Compliant with medications and has no side effects.  He does not have SOB at baseline but usually gets out of breath around the time of the pollen season.  Palpitations also occur around the same time.   Past Medical History:  Diagnosis Date   Allergy    Asthma    Diabetes mellitus without complication (HCC)    GERD (gastroesophageal reflux disease)    Hypertension    Palpitations    a. 10/2019: Monitor showing rare PVC's and no significant arrhythmias.     Past Surgical History:  Procedure Laterality Date   CATARACT EXTRACTION Bilateral    02/23/2022, 03/28/2022   ELBOW SURGERY     KNEE SURGERY Right    TONSILLECTOMY      Current Outpatient Medications  Medication Sig Dispense Refill   ACCU-CHEK AVIVA PLUS test strip USE AS INSTRUCTED TO TEST BLOOD SUGAR 2 TIMES DAILY 200 strip 0   albuterol (VENTOLIN HFA) 108 (90 Base) MCG/ACT inhaler INHALE 1 TO 2 PUFFS EVERY 4 TO 6 HOURS AS NEEDED 1 each 0   Blood Glucose Monitoring Suppl (ONE TOUCH ULTRA 2) w/Device KIT Use as instructed to blood sugar 2 times daily. 1 each 0   fexofenadine (ALLEGRA) 180 MG tablet Take 1 tablet (180 mg total) by mouth daily. For allergies 90 tablet 1    fluticasone (FLONASE) 50 MCG/ACT nasal spray PLACE 1 SPRAY INTO BOTH NOSTRILS 2   TIMES DAILY. 48 g 1   Lancets (ONETOUCH ULTRASOFT) lancets Use as instructed to test blood sugar 2 times daily. 100 each 5   latanoprost (XALATAN) 0.005 % ophthalmic solution SMARTSIG:1 Drop(s) In Eye(s) Every Evening     metFORMIN (GLUCOPHAGE) 500 MG tablet TAKE 2 TABLETS (1,000 MG TOTAL) BY MOUTH 2 (TWO) TIMES DAILY WITH A MEAL FOR DIABETES 360 tablet 1   metoprolol succinate (TOPROL-XL) 25 MG 24 hr tablet Take 1 tablet (25 mg total) by mouth daily. for high blood pressure 90 tablet 3   montelukast (SINGULAIR) 10 MG tablet TAKE 1 TABLET EVERY DAY AT BEDTIME FOR ALLERGIES/ASTHMA 90 tablet 0   omeprazole (PRILOSEC) 20 MG capsule Take 40 mg by mouth daily.     tamsulosin (FLOMAX) 0.4 MG CAPS capsule Take 2 capsules (0.8 mg total) by mouth daily. For urine flow. 180 capsule 0   tiZANidine (ZANAFLEX) 4 MG tablet TAKE 1 TABLET AT BEDTIME AS NEEDED FOR MUSCLE SPASM(S) 30 tablet 0   tiotropium (SPIRIVA HANDIHALER) 18 MCG inhalation capsule Place 1 capsule (18 mcg total) into inhaler and inhale daily. (Patient not taking: Reported on 02/04/2023) 30 capsule 2   No current facility-administered medications for this visit.   Allergies:  Coconut (cocos nucifera), Coconut oil, Iodine, Shellfish allergy, Shellfish-derived products, and Amlodipine   Social History: The patient  reports that he quit smoking about 48 years ago. His smoking use included cigarettes. He has a 48.00 pack-year smoking history. He has never used smokeless tobacco. He reports that he does not currently use alcohol after a past usage of about 1.0 - 2.0 standard drink of alcohol per week. He reports that he does not use drugs.   Family History: The patient's family history includes Diabetes in his maternal grandmother; Heart attack in his sister; Heart attack (age of onset: 73) in his mother; Pulmonary embolism (age of onset: 23) in his father.   ROS:  Please  see the history of present illness. Otherwise, complete review of systems is positive for none.  All other systems are reviewed and negative.   Physical Exam: VS:  BP 132/68   Pulse 72   Ht 5\' 9"  (1.753 m)   Wt 204 lb 12.8 oz (92.9 kg)   SpO2 93%   BMI 30.24 kg/m , BMI Body mass index is 30.24 kg/m.  Wt Readings from Last 3 Encounters:  02/04/23 204 lb 12.8 oz (92.9 kg)  01/10/23 200 lb (90.7 kg)  01/07/23 205 lb (93 kg)    General: Patient appears comfortable at rest. HEENT: Conjunctiva and lids normal, oropharynx clear with moist mucosa. Neck: Supple, no elevated JVP or carotid bruits, no thyromegaly. Lungs: Clear to auscultation, nonlabored breathing at rest. Cardiac: Regular rate and rhythm, no S3 or significant systolic murmur, no pericardial rub. Abdomen: Soft, nontender, no hepatomegaly, bowel sounds present, no guarding or rebound. Extremities: No pitting edema, distal pulses 2+. Skin: Warm and dry. Musculoskeletal: No kyphosis. Neuropsychiatric: Alert and oriented x3, affect grossly appropriate.  ECG:  NSR  Recent Labwork: 01/10/2023: ALT 15; AST 21; B Natriuretic Peptide 56.0; BUN 10; Creatinine, Ser 0.91; Hemoglobin 12.0; Platelets 305; Potassium 3.5; Sodium 130     Component Value Date/Time   CHOL 120 03/31/2022 1121   TRIG 176.0 (H) 03/31/2022 1121   HDL 41.00 03/31/2022 1121   CHOLHDL 3 03/31/2022 1121   VLDL 35.2 03/31/2022 1121   LDLCALC 44 03/31/2022 1121   LDLCALC 51 02/11/2018 1532   LDLDIRECT 151.6 09/22/2013 0915    Other Studies Reviewed Today:   Assessment and Plan: Patient is a 77 year old M known to have DM 2, HLD presented to cardiology clinic for follow-up visit.  # Palpitations -Continue metoprolol succinate 25 mg once daily. Can come off the medication after pollen season and see if palpitations recur. If there are recurrences of palpitations, he will need to go back on metoprolol.  # Screening for CAD -Obtain CT calcium scoring of  coronaries  I have spent a total of 20 minutes with patient reviewing chart, EKGs, labs and examining patient as well as establishing an assessment and plan that was discussed with the patient.  > 50% of time was spent in direct patient care.     Medication Adjustments/Labs and Tests Ordered: Current medicines are reviewed at length with the patient today.  Concerns regarding medicines are outlined above.   Tests Ordered: Orders Placed This Encounter  Procedures   CT CARDIAC SCORING (SELF PAY ONLY)    Medication Changes: No orders of the defined types were placed in this encounter.   Disposition:  Follow up  2 years  Signed, Erubiel Manasco Fidel Levy, MD, 02/04/2023 4:00 PM    [facility] [address] [place], [place] [address]

## 2023-02-04 NOTE — Patient Instructions (Signed)
Medication Instructions:  Your physician recommends that you continue on your current medications as directed. Please refer to the Current Medication list given to you today.  *If you need a refill on your cardiac medications before your next appointment, please call your pharmacy*   Lab Work: NONE   If you have labs (blood work) drawn today and your tests are completely normal, you will receive your results only by: Jay (if you have MyChart) OR A paper copy in the mail If you have any lab test that is abnormal or we need to change your treatment, we will call you to review the results.   Testing/Procedures: Calcium Score CT    Follow-Up: At [facility], you and your health needs are our priority.  As part of our continuing mission to provide you with exceptional heart care, we have created designated Provider Care Teams.  These Care Teams include your primary Cardiologist (physician) and Advanced Practice Providers (APPs -  Physician Assistants and Nurse Practitioners) who all work together to provide you with the care you need, when you need it.  We recommend signing up for the patient portal called "MyChart".  Sign up information is provided on this After Visit Summary.  MyChart is used to connect with patients for Virtual Visits (Telemedicine).  Patients are able to view lab/test results, encounter notes, upcoming appointments, etc.  Non-urgent messages can be sent to your provider as well.   To learn more about what you can do with MyChart, go to [email or web address].    Your next appointment:   2 year(s)  Provider:   Claudina Lick, MD    Other Instructions Thank you for choosing [facility]!

## 2023-03-01 ENCOUNTER — Other Ambulatory Visit: Payer: Self-pay | Admitting: Primary Care

## 2023-03-01 DIAGNOSIS — E119 Type 2 diabetes mellitus without complications: Secondary | ICD-10-CM

## 2023-03-02 DIAGNOSIS — H401131 Primary open-angle glaucoma, bilateral, mild stage: Secondary | ICD-10-CM | POA: Diagnosis not present

## 2023-03-02 LAB — HM DIABETES EYE EXAM

## 2023-03-24 ENCOUNTER — Ambulatory Visit (INDEPENDENT_AMBULATORY_CARE_PROVIDER_SITE_OTHER): Payer: Medicare HMO | Admitting: Primary Care

## 2023-03-24 VITALS — BP 122/64 | HR 65 | Temp 98.1°F | Ht 69.0 in | Wt 206.0 lb

## 2023-03-24 DIAGNOSIS — Z125 Encounter for screening for malignant neoplasm of prostate: Secondary | ICD-10-CM | POA: Diagnosis not present

## 2023-03-24 DIAGNOSIS — E1165 Type 2 diabetes mellitus with hyperglycemia: Secondary | ICD-10-CM

## 2023-03-24 DIAGNOSIS — E785 Hyperlipidemia, unspecified: Secondary | ICD-10-CM

## 2023-03-24 DIAGNOSIS — J4540 Moderate persistent asthma, uncomplicated: Secondary | ICD-10-CM

## 2023-03-24 DIAGNOSIS — R3915 Urgency of urination: Secondary | ICD-10-CM

## 2023-03-24 DIAGNOSIS — M25512 Pain in left shoulder: Secondary | ICD-10-CM

## 2023-03-24 DIAGNOSIS — E1159 Type 2 diabetes mellitus with other circulatory complications: Secondary | ICD-10-CM | POA: Diagnosis not present

## 2023-03-24 DIAGNOSIS — I152 Hypertension secondary to endocrine disorders: Secondary | ICD-10-CM

## 2023-03-24 DIAGNOSIS — R002 Palpitations: Secondary | ICD-10-CM | POA: Diagnosis not present

## 2023-03-24 DIAGNOSIS — E1169 Type 2 diabetes mellitus with other specified complication: Secondary | ICD-10-CM

## 2023-03-24 DIAGNOSIS — Z7984 Long term (current) use of oral hypoglycemic drugs: Secondary | ICD-10-CM | POA: Diagnosis not present

## 2023-03-24 DIAGNOSIS — G8929 Other chronic pain: Secondary | ICD-10-CM

## 2023-03-24 LAB — COMPREHENSIVE METABOLIC PANEL
ALT: 11 U/L (ref 0–53)
AST: 13 U/L (ref 0–37)
Albumin: 4 g/dL (ref 3.5–5.2)
Alkaline Phosphatase: 49 U/L (ref 39–117)
BUN: 12 mg/dL (ref 6–23)
CO2: 27 mEq/L (ref 19–32)
Calcium: 9 mg/dL (ref 8.4–10.5)
Chloride: 99 mEq/L (ref 96–112)
Creatinine, Ser: 0.93 mg/dL (ref 0.40–1.50)
GFR: 79.36 mL/min (ref >60.00)
Glucose, Bld: 109 mg/dL — ABNORMAL HIGH (ref 70–99)
Potassium: 4.3 mEq/L (ref 3.5–5.1)
Sodium: 134 mEq/L — ABNORMAL LOW (ref 135–145)
Total Bilirubin: 0.4 mg/dL (ref 0.2–1.2)
Total Protein: 6.3 g/dL (ref 6.0–8.3)

## 2023-03-24 LAB — POCT GLYCOSYLATED HEMOGLOBIN (HGB A1C): Hemoglobin A1C: 7.2 % — AB (ref 4.0–5.6)

## 2023-03-24 LAB — PSA, MEDICARE: PSA: 2.76 ng/ml (ref 0.10–4.00)

## 2023-03-24 LAB — LIPID PANEL
Cholesterol: 171 mg/dL (ref 0–200)
HDL: 40.9 mg/dL (ref >39.00)
LDL Cholesterol: 92 mg/dL (ref 0–99)
NonHDL: 129.63
Total CHOL/HDL Ratio: 4
Triglycerides: 186 mg/dL — ABNORMAL HIGH (ref 0.0–149.0)
VLDL: 37.2 mg/dL (ref 0.0–40.0)

## 2023-03-24 MED ORDER — FLUTICASONE FUROATE-VILANTEROL 100-25 MCG/ACT IN AEPB: 1.0000 | INHALATION_SPRAY | Freq: Every day | RESPIRATORY_TRACT | 5 refills | Status: DC

## 2023-03-24 NOTE — Patient Instructions (Signed)
Start Breo Ellipta inhaler. Inhale 1 puff into the lungs once daily for asthma.  Stop by the lab prior to leaving today. I will notify you of your results once received.   Schedule an appointment with Dr. Patsy Lager.  You will either be contacted via phone regarding your referral to pulmonology, or you may receive a letter on your MyChart portal from our referral team with instructions for scheduling an appointment. Please let us know if you have not been contacted by anyone within two weeks.  It was a pleasure to see you today!

## 2023-03-24 NOTE — Assessment & Plan Note (Signed)
Uncontrolled. Reviewed spirometry readings from last week.  Start Breo Ellipta 100-25 mcg daily.  Continue albuterol PRN.   Referral placed for pulmonology

## 2023-03-24 NOTE — Assessment & Plan Note (Signed)
Improved.  Continue Flomax 0.8 mg daily.

## 2023-03-24 NOTE — Assessment & Plan Note (Signed)
Continued, no improvement with PT. He will follow up with sports medicine for next steps. Reviewed xray from 2023.

## 2023-03-24 NOTE — Assessment & Plan Note (Signed)
Improved with A1C of 7.2!  Continue metformin 500 mg BID. Commended him on lifestyle changes.

## 2023-03-24 NOTE — Assessment & Plan Note (Signed)
Controlled.  Continue metoprolol succinate 25 mg daily. Reviewed cardiology notes from March 2024.

## 2023-03-24 NOTE — Assessment & Plan Note (Signed)
For some reason his rosuvastatin was discontinued by a CMA. Repeat lipid panel pending.   Will resume statin therapy, await results.

## 2023-03-24 NOTE — Progress Notes (Signed)
Subjective:    Patient ID: Brian Mullen, male    DOB: 1946/08/19, 77 y.o.   MRN: [id number]  HPI  Kahleel Fertitta is a very pleasant 77 y.o. male with a history of type 2 diabetes, hypertension, PCV's, asthma, palpations, hyperlipidemia, chronic back pain, testosterone deficiency who presents today for follow up of chronic conditions.  His girlfriend joins [place] today.  1) Type 2 Diabetes:  Current medications include: Metformin 500 mg BID  He is checking his blood glucose 2 times daily and is getting readings of:  AM fasting: high 100's Evening: mid 100's  Last A1C: 7.6 in November 2023, 7.2 today Last Eye Exam: UTD Last Foot Exam: Due Pneumonia Vaccination: 2020 Urine Microalbumin: UTD Statin: previously managed on rosuvastatin   Dietary changes since last visit: He is cutting back on red meat, eating brown rice and fresh vegetables.    Exercise: Active   2) Palpitations: Following with cardiology and is managed on metoprolol succinate 25 mg daily. Last visit was in March 2024. At the time a CT coronary scan was recommended for which he has not completed due to cost.   He denies palpations, chest pain.   3) Asthma/Seasonal Allergies: Currently managed on Allegra 180 mg daily, Singulair 10 mg daily, albuterol inhaler PRN, Spiriva 18 mcg daily. He does not use his Spiriva but for seasonal changes only.   He is not taking his Spiriva. He underwent spirometry one week ago with FVC of 2.09 and FEV 1 was 1.32. He would like to see pulmonology  He continues to experience exertional shortness of breath with normal ADL's, fatigue, mucous production in the morning, cough in the morning. He is using his albuterol inhaler infrequently overall.   He quit smoking in 1967.  3) Urinary Urgency/Weak Urinary Stream: Currently managed on Flomax 0.8 mg daily. Overall feels an improvement on this regimen.   4) Chronic Shoulder Pain: Chronic to the left shoulder for >1 year. He recently  completed physical therapy which was ineffective. He underwent xray in November 2023 which showed degenerative changes.   BP Readings from Last 3 Encounters:  03/24/23 122/64  02/04/23 132/68  01/10/23 133/60       Review of Systems  Constitutional:  Positive for fatigue.  Respiratory:  Positive for cough and shortness of breath.   Cardiovascular:  Negative for chest pain and palpitations.  Gastrointestinal:  Negative for constipation and diarrhea.  Genitourinary:  Negative for difficulty urinating.  Musculoskeletal:  Positive for arthralgias.  Neurological:  Negative for headaches.  Psychiatric/Behavioral:  The patient is not nervous/anxious.          Past Medical History:  Diagnosis Date   Allergy    Asthma    Cellulitis of left upper extremity 06/27/2021   Diabetes mellitus without complication (HCC)    GERD (gastroesophageal reflux disease)    Hypertension    Palpitations    a. 10/2019: Monitor showing rare PVC's and no significant arrhythmias.     Social History   Socioeconomic History   Marital status: Widowed    Spouse name: Not on file   Number of children: Not on file   Years of education: Not on file   Highest education level: 8th grade  Occupational History   Not on file  Tobacco Use   Smoking status: Former    Packs/day: 4.00    Years: 12.00    Additional pack years: 0.00    Total pack years: 48.00    Types: Cigarettes  Quit date: 32    Years since quitting: 48.3   Smokeless tobacco: Never   Tobacco comments:    used to smoke Lucky's  Vaping Use   Vaping Use: Never used  Substance and Sexual Activity   Alcohol use: Not Currently    Alcohol/week: 1.0 - 2.0 standard drink of alcohol    Types: 1 - 2 Standard drinks or equivalent per week    Comment: occ   Drug use: No   Sexual activity: Not on file  Other Topics Concern   Not on file  Social History Narrative   Married.   No children   Retried. Worked as a Engineer, agricultural.   Enjoys working on [employer], yard work, gardening.   Right handed    Social Determinants of Health   Financial Resource Strain: Low Risk  (03/24/2023)   Overall Financial Resource Strain (CARDIA)    Difficulty of Paying Living Expenses: Not hard at all  Food Insecurity: No Food Insecurity (03/24/2023)   Hunger Vital Sign    Worried About Running Out of Food in the Last Year: Never true    Ran Out of Food in the Last Year: Never true  Transportation Needs: No Transportation Needs (03/24/2023)   PRAPARE - Administrator, Civil Service (Medical): No    Lack of Transportation (Non-Medical): No  Physical Activity: Insufficiently Active (03/24/2023)   Exercise Vital Sign    Days of Exercise per Week: 1 day    Minutes of Exercise per Session: 10 min  Stress: No Stress Concern Present (03/24/2023)   [employer] of Occupational Health - Occupational Stress Questionnaire    Feeling of Stress : Not at all  Social Connections: Moderately Isolated (03/24/2023)   Social Connection and Isolation Panel [NHANES]    Frequency of Communication with Friends and Family: More than three times a week    Frequency of Social Gatherings with Friends and Family: Once a week    Attends Religious Services: Never    Database administrator or Organizations: No    Attends Engineer, structural: Not on file    Marital Status: Living with partner  Intimate Partner Violence: Not on file    Past Surgical History:  Procedure Laterality Date   CATARACT EXTRACTION Bilateral    02/23/2022, 03/28/2022   ELBOW SURGERY     KNEE SURGERY Right    TONSILLECTOMY      Family History  Problem Relation Age of Onset   Pulmonary embolism Father 63       Deceased   Heart attack Mother 69       Deceased   Diabetes Maternal Grandmother    Heart attack Sister     Allergies  Allergen Reactions   Coconut (Cocos Nucifera) Anaphylaxis   Coconut Oil Anaphylaxis   Iodine Anaphylaxis   Shellfish  Allergy Anaphylaxis   Shellfish-Derived Products Anaphylaxis   Amlodipine Cough    LE Swelling    Current Outpatient Medications on File Prior to Visit  Medication Sig Dispense Refill   ACCU-CHEK AVIVA PLUS test strip USE AS INSTRUCTED TO TEST BLOOD SUGAR 2 TIMES DAILY 200 strip 0   albuterol (VENTOLIN HFA) 108 (90 Base) MCG/ACT inhaler INHALE 1 TO 2 PUFFS EVERY 4 TO 6 HOURS AS NEEDED 1 each 0   Blood Glucose Monitoring Suppl (ONE TOUCH ULTRA 2) w/Device KIT Use as instructed to blood sugar 2 times daily. 1 each 0   fexofenadine (ALLEGRA) 180 MG  tablet Take 1 tablet (180 mg total) by mouth daily. For allergies 90 tablet 1   fluticasone (FLONASE) 50 MCG/ACT nasal spray PLACE 1 SPRAY INTO BOTH NOSTRILS 2   TIMES DAILY. 48 g 1   Lancets (ONETOUCH ULTRASOFT) lancets Use as instructed to test blood sugar 2 times daily. 100 each 5   latanoprost (XALATAN) 0.005 % ophthalmic solution SMARTSIG:1 Drop(s) In Eye(s) Every Evening     metFORMIN (GLUCOPHAGE) 500 MG tablet TAKE 2 TABLETS TWICE A DAY WITH MEALS FOR DIABETES 360 tablet 0   metoprolol succinate (TOPROL-XL) 25 MG 24 hr tablet Take 1 tablet (25 mg total) by mouth daily. for high blood pressure 90 tablet 3   montelukast (SINGULAIR) 10 MG tablet TAKE 1 TABLET EVERY DAY AT BEDTIME FOR ALLERGIES/ASTHMA 90 tablet 0   omeprazole (PRILOSEC) 20 MG capsule Take 40 mg by mouth daily.     tamsulosin (FLOMAX) 0.4 MG CAPS capsule Take 2 capsules (0.8 mg total) by mouth daily. For urine flow. 180 capsule 0   tiZANidine (ZANAFLEX) 4 MG tablet TAKE 1 TABLET AT BEDTIME AS NEEDED FOR MUSCLE SPASM(S) 30 tablet 0   tiotropium (SPIRIVA HANDIHALER) 18 MCG inhalation capsule Place 1 capsule (18 mcg total) into inhaler and inhale daily. (Patient not taking: Reported on 02/04/2023) 30 capsule 2   No current facility-administered medications on file prior to visit.    BP 122/64   Pulse 65   Temp 98.1 F (36.7 C) (Temporal)   Ht 5\' 9"  (1.753 m)   Wt 206 lb (93.4 kg)    SpO2 96%   BMI 30.42 kg/m  Objective:   Physical Exam Cardiovascular:     Rate and Rhythm: Normal rate and regular rhythm.  Pulmonary:     Effort: Pulmonary effort is normal.     Breath sounds: Normal breath sounds. No wheezing or rales.  Abdominal:     Palpations: Abdomen is soft.     Tenderness: There is no abdominal tenderness.  Musculoskeletal:     Cervical back: Neck supple.  Skin:    General: Skin is warm and dry.  Neurological:     Mental Status: He is alert and oriented to person, place, and time.  Psychiatric:        Mood and Affect: Mood normal.           Assessment & Plan:  Type 2 diabetes mellitus with hyperglycemia, without long-term current use of insulin (HCC) Assessment & Plan: Improved with A1C of 7.2!  Continue metformin 500 mg BID. Commended him on lifestyle changes.  Orders: -     POCT glycosylated hemoglobin (Hb A1C)  Moderate persistent asthma without complication Assessment & Plan: Uncontrolled. Reviewed spirometry readings from last week.  Start Breo Ellipta 100-25 mcg daily.  Continue albuterol PRN.   Referral placed for pulmonology   Orders: -     Fluticasone Furoate-Vilanterol; Inhale 1 puff into the lungs daily.  Dispense: 60 each; Refill: 5 -     Ambulatory referral to Pulmonology  Hypertension associated with diabetes [facility]) Assessment & Plan: Controlled.  Continue metoprolol succinate 25 mg daily.   Hyperlipidemia associated with type 2 diabetes mellitus (HCC) Assessment & Plan: For some reason his rosuvastatin was discontinued by a CMA. Repeat lipid panel pending.   Will resume statin therapy, await results.   Orders: -     Lipid panel -     Comprehensive metabolic panel  Chronic left shoulder pain Assessment & Plan: Continued, no improvement with PT. He will  follow up with sports medicine for next steps. Reviewed xray from 2023.   Palpitations Assessment & Plan: Controlled.  Continue metoprolol  succinate 25 mg daily. Reviewed cardiology notes from March 2024.   Urinary urgency Assessment & Plan: Improved.  Continue Flomax 0.8 mg daily.   Screening for prostate cancer -     PSA, Medicare        Doreene Nest, NP

## 2023-03-24 NOTE — Assessment & Plan Note (Signed)
Controlled.  Continue metoprolol succinate 25 mg daily. 

## 2023-03-28 NOTE — Progress Notes (Unsigned)
    Brian Reaney T. Dequante Tremaine, MD, CAQ Sports Medicine [facility] [address] [place], [address]  Phone: [phone]  FAX: [phone]  Brian Mullen - 77 y.o. male  MRN [id number]  Date of Birth: 02/28/1946  Date: 03/29/2023  PCP: Doreene Nest, NP  Referral: Doreene Nest, NP  No chief complaint on file.  Subjective:   Brian Mullen is a 77 y.o. very pleasant male patient with There is no height or weight on file to calculate BMI. who presents with the following:  The patient presents with chronic left shoulder pain.  He does have some very mild glenohumeral joint arthritis.  He had recently last year did do some formal physical therapy for his shoulder.  The last time I saw him I did do an intra-articular injection of the left shoulder.    Review of Systems is noted in the HPI, as appropriate  Objective:   There were no vitals taken for this visit.  GEN: No acute distress; alert,appropriate. PULM: Breathing comfortably in no respiratory distress PSYCH: Normally interactive.   Laboratory and Imaging Data:  Assessment and Plan:   ***

## 2023-03-29 ENCOUNTER — Ambulatory Visit (INDEPENDENT_AMBULATORY_CARE_PROVIDER_SITE_OTHER): Payer: Medicare HMO | Admitting: Family Medicine

## 2023-03-29 ENCOUNTER — Encounter: Payer: Self-pay | Admitting: Family Medicine

## 2023-03-29 VITALS — BP 106/60 | HR 76 | Temp 97.6°F | Ht 69.0 in | Wt 205.1 lb

## 2023-03-29 DIAGNOSIS — G8929 Other chronic pain: Secondary | ICD-10-CM | POA: Diagnosis not present

## 2023-03-29 DIAGNOSIS — M170 Bilateral primary osteoarthritis of knee: Secondary | ICD-10-CM

## 2023-03-29 DIAGNOSIS — M25512 Pain in left shoulder: Secondary | ICD-10-CM | POA: Diagnosis not present

## 2023-03-29 MED ORDER — CELECOXIB 200 MG PO CAPS: 200.0000 mg | ORAL_CAPSULE | Freq: Every day | ORAL | 2 refills | Status: DC

## 2023-03-29 NOTE — Patient Instructions (Signed)
Try the Celecoxib and stop the Naproxen/Alleve.

## 2023-04-02 ENCOUNTER — Telehealth: Payer: Self-pay | Admitting: Primary Care

## 2023-04-02 NOTE — Telephone Encounter (Signed)
Patient called in and stated that he seen Dr. Patsy Lager and was prescribed celecoxib (CELEBREX) 200 MG capsule. He stated that he had an reaction to it and was wondering if something else could be sent in. Thank you!

## 2023-04-02 NOTE — Telephone Encounter (Signed)
Can you call?  I am happy to send something else in, but I really need to know what kind of reaction to it he had?

## 2023-04-02 NOTE — Telephone Encounter (Signed)
Spoke with Brian Mullen.  He states he throat started to close up.  He states he then read that people with asthmas shouldn't take Celebrex.

## 2023-04-03 MED ORDER — ETODOLAC 400 MG PO TABS: 400.0000 mg | ORAL_TABLET | Freq: Two times a day (BID) | ORAL | 1 refills | Status: DC

## 2023-04-03 NOTE — Addendum Note (Signed)
Addended by: Hannah Beat on: 04/03/2023 10:22 AM   Modules accepted: Orders

## 2023-04-03 NOTE — Telephone Encounter (Signed)
Lupita Leash, I sent him in a script for etodolac to try.   No sulfa allergy.  He does routinely take naproxen.  Doubt class allergy.  I think it is reasonable to start a different anti-inflammatory.

## 2023-04-05 NOTE — Telephone Encounter (Signed)
Mr. Vivier notified as instructed by telephone.

## 2023-04-13 ENCOUNTER — Ambulatory Visit: Payer: Medicare HMO | Admitting: Primary Care

## 2023-04-15 ENCOUNTER — Encounter: Payer: Self-pay | Admitting: Pharmacist

## 2023-04-15 ENCOUNTER — Other Ambulatory Visit: Payer: Self-pay

## 2023-04-15 ENCOUNTER — Other Ambulatory Visit (HOSPITAL_COMMUNITY): Payer: Self-pay

## 2023-04-15 NOTE — Telephone Encounter (Signed)
Please return call  We talked about this when he was in the office a couple of weeks ago, and I asked our people to check on his benefits.  It is fairly cumbersome and seems to always take a long time, but I will check in again about it.

## 2023-04-15 NOTE — Telephone Encounter (Signed)
Pt called in wants Dr Patsy Lager to know that he's had Hyaluronic acid injection for his knee in the past and if he use something similar he would like to schedule to see him . Please advise # [phone]

## 2023-04-15 NOTE — Telephone Encounter (Signed)
Mr. Lombardi notified as instructed by telephone.  Patient states understanding.

## 2023-04-20 ENCOUNTER — Other Ambulatory Visit: Payer: Self-pay

## 2023-04-20 NOTE — Telephone Encounter (Signed)
Message sent regarding this

## 2023-05-04 ENCOUNTER — Ambulatory Visit (HOSPITAL_BASED_OUTPATIENT_CLINIC_OR_DEPARTMENT_OTHER): Payer: Medicare HMO | Admitting: Pulmonary Disease

## 2023-05-06 ENCOUNTER — Telehealth: Payer: Self-pay

## 2023-05-06 NOTE — Telephone Encounter (Signed)
Please call patient:  Received refill request for his Breo inhaler from [facility].  I sent a 86-month supply to [facility] pharmacy in [place] in May.  Is he switching to [facility] for this prescription?  How many inhalers does he have left at home?

## 2023-05-07 NOTE — Telephone Encounter (Signed)
Patient stated he is planning on switching to [facility] however, he will finish picking up the remaining of the prescription from [facility], He will let us know when he is running low.

## 2023-05-07 NOTE — Telephone Encounter (Signed)
Noted  

## 2023-05-10 ENCOUNTER — Ambulatory Visit (HOSPITAL_BASED_OUTPATIENT_CLINIC_OR_DEPARTMENT_OTHER): Payer: Medicare HMO | Admitting: Pulmonary Disease

## 2023-05-10 ENCOUNTER — Encounter (HOSPITAL_BASED_OUTPATIENT_CLINIC_OR_DEPARTMENT_OTHER): Payer: Self-pay | Admitting: Pulmonary Disease

## 2023-05-10 VITALS — BP 120/64 | HR 70 | Ht 69.0 in | Wt 206.8 lb

## 2023-05-10 DIAGNOSIS — R0609 Other forms of dyspnea: Secondary | ICD-10-CM | POA: Diagnosis not present

## 2023-05-10 MED ORDER — TRELEGY ELLIPTA 100-62.5-25 MCG/ACT IN AEPB: 1.0000 | INHALATION_SPRAY | Freq: Every day | RESPIRATORY_TRACT | 0 refills | Status: DC

## 2023-05-10 MED ORDER — TRELEGY ELLIPTA 100-62.5-25 MCG/ACT IN AEPB: 1.0000 | INHALATION_SPRAY | Freq: Every day | RESPIRATORY_TRACT | 5 refills | Status: DC

## 2023-05-10 NOTE — Progress Notes (Signed)
[facility]  Chief Complaint  Patient presents with   Establish Care    Former RA patient for insomnia and snoring. Denies ever having a sleep study before. Does snore lightly at night.     Past Surgical History:  He  has a past surgical history that includes Tonsillectomy; Elbow surgery; Knee surgery (Right); and Cataract extraction (Bilateral).  Past Medical History:  Allergies, DM type 2, GERD, HTN  Constitutional:  BP 120/64   Pulse 70   Ht 5\' 9"  (1.753 m)   Wt 206 lb 12.8 oz (93.8 kg)   SpO2 96% Comment: on RA  BMI 30.54 kg/m   Brief Summary:  Brian Mullen is a 77 y.o. male former smoker with dyspnea.      Subjective:   He is here with his wife.  He was seen in May 2022 by Dr. Vassie Loll for insomnia.  He is originally from [place] but has lived in [place] for the past few years.  He worked as a Curator.  He was never in the [employer].  He smoked 3 ppd and quit in 1969.  He went to a motor sport event and they had a health fair there.  He did some type of breathing test and was told his numbers were really low and he should see a lung doctor.  He has noticed feeling more short of breath with activity.  He has to stop and rest while walking up a hill now.  He uses breo and this helps.  He uses albuterol several times per week and this helps.  He will sometimes get a cough and chest congestion.  Not having any trouble with his breathing while asleep.    He use to get pneumonia frequently when he was a kid, but not recently.  He doesn't have too much trouble with allergies.  He has a Development worker, international aid.  Chest xray from 01/10/23 normal.  Physical Exam:   Appearance - well kempt   ENMT - no sinus tenderness, no oral exudate, no LAN, Mallampati 3 airway, no stridor, poor dentition  Respiratory - equal breath sounds bilaterally, no wheezing or rales  CV - s1s2 regular rate and rhythm, no murmurs  Ext - no clubbing, no edema  Skin - no  rashes  Psych - normal mood and affect   Pulmonary testing:    Chest Imaging:    Cardiac Tests:  Echo 10/31/19 >> EF 55 to 60%  Social History:  He  reports that he quit smoking about 48 years ago. His smoking use included cigarettes. He has a 48.00 pack-year smoking history. He has never used smokeless tobacco. He reports that he does not currently use alcohol after a past usage of about 1.0 - 2.0 standard drink of alcohol per week. He reports that he does not use drugs.  Family History:  His family history includes Diabetes in his maternal grandmother; Heart attack in his sister; Heart attack (age of onset: 62) in his mother; Pulmonary embolism (age of onset: 70) in his father.     Assessment/Plan:   Dyspnea on exertion with history of tobacco abuse. - concerned he could have COPD - will arrange for pulmonary function test - will switch from breo to trelegy 100 one puff daily; reviewed side effects to monitor for with trelegy - prn albuterol - inhaler technique demostrated  Palpitations, Hyperlipidemia. - he is followed by cardiology in [place] - he has cardiac CT scan ordered  Time  Spent Involved in Patient Care on Day of Examination:  50 minutes  Follow up:   Patient Instructions  Will arrange for pulmonary function test at [facility]  Trelegy one puff daily, and rinse your mouth after each use  Albuterol two puffs every 6 hours as needed for cough, wheeze, chest congestion or shortness of breath  Follow up in 6 to 8 weeks   Medication List:   Allergies as of 05/10/2023       Reactions   Coconut (cocos Nucifera) Anaphylaxis   Coconut Oil Anaphylaxis   Iodine Anaphylaxis   Shellfish Allergy Anaphylaxis   Shellfish-derived Products Anaphylaxis   Amlodipine Cough   LE Swelling        Medication List        Accurate as of May 10, 2023  1:24 PM. If you have any questions, ask your nurse or doctor.          STOP taking these  medications    etodolac 400 MG tablet Commonly known as: LODINE Stopped by: Coralyn Helling, MD   fluticasone furoate-vilanterol 100-25 MCG/ACT Aepb Commonly known as: BREO ELLIPTA Stopped by: Coralyn Helling, MD       TAKE these medications    Accu-Chek Aviva Plus test strip Generic drug: glucose blood USE AS INSTRUCTED TO TEST BLOOD SUGAR 2 TIMES DAILY   albuterol 108 (90 Base) MCG/ACT inhaler Commonly known as: VENTOLIN HFA INHALE 1 TO 2 PUFFS EVERY 4 TO 6 HOURS AS NEEDED   fexofenadine 180 MG tablet Commonly known as: ALLEGRA Take 1 tablet (180 mg total) by mouth daily. For allergies   fluticasone 50 MCG/ACT nasal spray Commonly known as: FLONASE PLACE 1 SPRAY INTO BOTH NOSTRILS 2   TIMES DAILY.   latanoprost 0.005 % ophthalmic solution Commonly known as: XALATAN SMARTSIG:1 Drop(s) In Eye(s) Every Evening   metFORMIN 500 MG tablet Commonly known as: GLUCOPHAGE TAKE 2 TABLETS TWICE A DAY WITH MEALS FOR DIABETES   metoprolol succinate 25 MG 24 hr tablet Commonly known as: TOPROL-XL Take 1 tablet (25 mg total) by mouth daily. for high blood pressure   montelukast 10 MG tablet Commonly known as: SINGULAIR TAKE 1 TABLET EVERY DAY AT BEDTIME FOR ALLERGIES/ASTHMA   omeprazole 20 MG capsule Commonly known as: PRILOSEC Take 40 mg by mouth daily.   ONE TOUCH ULTRA 2 w/Device Kit Use as instructed to blood sugar 2 times daily.   onetouch ultrasoft lancets Use as instructed to test blood sugar 2 times daily.   tamsulosin 0.4 MG Caps capsule Commonly known as: FLOMAX Take 2 capsules (0.8 mg total) by mouth daily. For urine flow.   tiZANidine 4 MG tablet Commonly known as: ZANAFLEX TAKE 1 TABLET AT BEDTIME AS NEEDED FOR MUSCLE SPASM(S)   Trelegy Ellipta 100-62.5-25 MCG/ACT Aepb Generic drug: Fluticasone-Umeclidin-Vilant Inhale 1 puff into the lungs daily in the afternoon. Started by: Coralyn Helling, MD        Signature:  Coralyn Helling, MD [facility]  Pulmonary/Critical Care Pager - [phone] 05/10/2023, 1:24 PM

## 2023-05-10 NOTE — Addendum Note (Signed)
Addended by: Maurene Capes on: 05/10/2023 01:40 PM   Modules accepted: Orders

## 2023-05-10 NOTE — Patient Instructions (Signed)
Will arrange for pulmonary function test at [facility]  Trelegy one puff daily, and rinse your mouth after each use  Albuterol two puffs every 6 hours as needed for cough, wheeze, chest congestion or shortness of breath  Follow up in 6 to 8 weeks

## 2023-05-15 ENCOUNTER — Other Ambulatory Visit: Payer: Self-pay | Admitting: Primary Care

## 2023-05-15 DIAGNOSIS — E119 Type 2 diabetes mellitus without complications: Secondary | ICD-10-CM

## 2023-05-15 DIAGNOSIS — R3915 Urgency of urination: Secondary | ICD-10-CM

## 2023-05-15 DIAGNOSIS — I10 Essential (primary) hypertension: Secondary | ICD-10-CM

## 2023-05-15 DIAGNOSIS — E1165 Type 2 diabetes mellitus with hyperglycemia: Secondary | ICD-10-CM

## 2023-05-29 ENCOUNTER — Other Ambulatory Visit (HOSPITAL_COMMUNITY): Payer: Self-pay

## 2023-05-29 ENCOUNTER — Telehealth: Payer: Self-pay | Admitting: Pharmacy Technician

## 2023-05-29 DIAGNOSIS — E1169 Type 2 diabetes mellitus with other specified complication: Secondary | ICD-10-CM

## 2023-05-29 DIAGNOSIS — E119 Type 2 diabetes mellitus without complications: Secondary | ICD-10-CM

## 2023-05-29 DIAGNOSIS — E785 Hyperlipidemia, unspecified: Secondary | ICD-10-CM

## 2023-05-29 NOTE — Telephone Encounter (Signed)
Pharmacy Patient Advocate Encounter  Received notification from [facility] that Prior Authorization for [facility] 2 Sensor has been DENIED because The Medicare rule in the Medicare Benefit Policy Manual (Chapter 15, Section 50.4) says a medical device is covered under the Part B (medical) benefit when the use is reasonable and necessary for treating the individual patient. The accepted use of this medical device for your condition requires that you: have a diagnosis of diabetes mellitus (type 1 or type 2) AND meet one of the following: requires treatment with insulin; OR has a documented history of problematic hypoglycemia with documentation of one of the following: More than one level 2 hypoglycemic event (defined as glucose less than 54mg /dL (1.6XWRU/E)) that persists despite more than one attempt to adjust medication therapy or modification of diabetes treatment plan OR History of one level 3 hypoglycemic event (glucose less than 54mg /dL (4.5WUJW/J)) characterized by altered mental or physical state requiring hypoglycemia treatment...  PA #/Case ID/Reference #: [id number] Key: [id number]   Please be advised we currently do not have a Pharmacist to review denials, therefore you will need to process appeals accordingly as needed. Thanks for your support at this time. Contact for appeals are as follows: Phone: [phone], Fax: [phone]

## 2023-05-29 NOTE — Telephone Encounter (Signed)
Pharmacy Patient Advocate Encounter   Received notification from CoverMyMeds that prior authorization for FreeStyle Libre 2 Sensor is required/requested.   Insurance verification completed.   The patient is insured through [employer] .   Per test claim:   PA submitted to [facility] via CoverMyMeds Key/confirmation #/EOC [id number] Status is pending

## 2023-05-31 MED ORDER — ACCU-CHEK AVIVA PLUS VI STRP: ORAL_STRIP | 0 refills | Status: DC

## 2023-05-31 MED ORDER — ROSUVASTATIN CALCIUM 5 MG PO TABS: 5.0000 mg | ORAL_TABLET | Freq: Every day | ORAL | 2 refills | Status: DC

## 2023-05-31 MED ORDER — ONETOUCH ULTRASOFT LANCETS MISC: 5 refills | Status: DC

## 2023-05-31 NOTE — Telephone Encounter (Signed)
Please notify patient that the freestyle libre 2 sensors where rejected by his insurance.  He can call [employer] to see if they will pay for the freestyle 3 libre sensors or the competitor, Dexcom.

## 2023-05-31 NOTE — Telephone Encounter (Signed)
Called patient states that he will just stay with the Aviva plus. Would like test strips and lancets called in. I have pended. Patient also states that he needs refill on Crestor but do not see on his med list.

## 2023-05-31 NOTE — Telephone Encounter (Signed)
Noted.   It looks like Lupita Leash, CMA discontinued the Crestor from his medication list.  Unclear why.  Refills provided.

## 2023-06-17 ENCOUNTER — Telehealth: Payer: Self-pay

## 2023-06-17 DIAGNOSIS — E1165 Type 2 diabetes mellitus with hyperglycemia: Secondary | ICD-10-CM

## 2023-06-17 MED ORDER — BLOOD GLUCOSE TEST VI STRP: 1.0000 | ORAL_STRIP | Freq: Three times a day (TID) | 1 refills | Status: DC

## 2023-06-17 MED ORDER — BLOOD GLUCOSE MONITORING SUPPL DEVI: 1.0000 | Freq: Three times a day (TID) | 0 refills | Status: AC

## 2023-06-17 MED ORDER — LANCET DEVICE MISC: 1.0000 | Freq: Three times a day (TID) | 0 refills | Status: DC

## 2023-06-17 MED ORDER — LANCETS MISC. MISC: 1.0000 | Freq: Three times a day (TID) | 1 refills | Status: AC

## 2023-06-17 NOTE — Addendum Note (Signed)
Addended by: Doreene Nest on: 06/17/2023 01:42 PM   Modules accepted: Orders

## 2023-06-17 NOTE — Telephone Encounter (Signed)
Called and spoke to patient he states the meter he was using was discontinued so the test strips and lancets [facility] sent him would not work. They advised he would need to get a new device to work with the test strips and lancets they now have available.  Patient just needs True Metrix Air glucose meter sent in.

## 2023-06-17 NOTE — Telephone Encounter (Signed)
Noted.  Prescription for glucometer sent to pharmacy.

## 2023-06-17 NOTE — Telephone Encounter (Signed)
Please clarify order.  Does he need a refill of the lancets?  Test trips? He should not need an entirely new kit.

## 2023-07-12 ENCOUNTER — Telehealth: Payer: Self-pay | Admitting: Primary Care

## 2023-07-12 DIAGNOSIS — R252 Cramp and spasm: Secondary | ICD-10-CM

## 2023-07-12 DIAGNOSIS — G8929 Other chronic pain: Secondary | ICD-10-CM

## 2023-07-12 MED ORDER — TIZANIDINE HCL 4 MG PO TABS
4.0000 mg | ORAL_TABLET | Freq: Every evening | ORAL | 0 refills | Status: DC | PRN
Start: 2023-07-12 — End: 2024-02-08

## 2023-07-12 NOTE — Addendum Note (Signed)
Addended by: Doreene Nest on: 07/12/2023 07:13 PM   Modules accepted: Orders

## 2023-07-12 NOTE — Telephone Encounter (Signed)
Patient is due for diabetes follow up in early November. Please schedule, thank you!

## 2023-07-12 NOTE — Telephone Encounter (Signed)
Prescription Request  07/12/2023  LOV: 03/24/2023  What is the name of the medication or equipment? tiZANidine (ZANAFLEX) 4 MG tablet   Have you contacted your pharmacy to request a refill? Yes   Which pharmacy would you like this sent to?  [facility] - [place], [place] - [address] [place] [address] Phone: [phone] Fax: [phone]    Patient notified that their request is being sent to the clinical staff for review and that they should receive a response within 2 business days.   Please advise at Mobile [phone] (mobile)

## 2023-07-13 NOTE — Telephone Encounter (Signed)
Spoke to pt, scheduled f/u for 11/5

## 2023-07-16 ENCOUNTER — Ambulatory Visit (HOSPITAL_BASED_OUTPATIENT_CLINIC_OR_DEPARTMENT_OTHER): Payer: Medicare HMO | Admitting: Pulmonary Disease

## 2023-07-16 ENCOUNTER — Encounter (HOSPITAL_BASED_OUTPATIENT_CLINIC_OR_DEPARTMENT_OTHER): Payer: Self-pay | Admitting: Pulmonary Disease

## 2023-07-16 VITALS — BP 118/52 | HR 73 | Resp 16 | Ht 69.0 in | Wt 200.3 lb

## 2023-07-16 DIAGNOSIS — R0683 Snoring: Secondary | ICD-10-CM

## 2023-07-16 DIAGNOSIS — F5104 Psychophysiologic insomnia: Secondary | ICD-10-CM

## 2023-07-16 DIAGNOSIS — R0609 Other forms of dyspnea: Secondary | ICD-10-CM

## 2023-07-16 NOTE — Patient Instructions (Signed)
Will make sure your pulmonary function test gets scheduled  Will arrange for a home sleep study  Will call to arrange for follow up after sleep study reviewed

## 2023-07-16 NOTE — Progress Notes (Signed)
[name]  Chief Complaint  Patient presents with   Follow-up    DOE-Doing Pretty good as far as breathing goes-last three days was hard with heat    Past Surgical History:  He  has a past surgical history that includes Tonsillectomy; Elbow surgery; Knee surgery (Right); and Cataract extraction (Bilateral).  Past Medical History:  Allergies, DM type 2, GERD, HTN  Constitutional:  BP (!) 118/52   Pulse 73   Resp 16   Ht 5\' 9"  (1.753 m)   Wt 200 lb 4.6 oz (90.9 kg)   SpO2 97%   BMI 29.58 kg/m   Brief Summary:  Brian Mullen is a 77 y.o. male former smoker with dyspnea.      Subjective:   He is here with his family.  PFT and cardiac CT not done yet.  He got blurred vision from using trelegy.  Switched back to breo.  Not having cough, wheeze, or sputum.  Breathing rough in hot weather, but okay otherwise.  He has trouble staying asleep.  Goes to bed at 8 pm and wakes up at 4 am.  This was his work scheduled and he has stuck with this.  His sleep issues started after his wife passed away.  He has trouble waking up frequently at night.  He will go to the bathroom.  He tries to make up his sleep later in the day.  Physical Exam:   Appearance - well kempt   ENMT - no sinus tenderness, no oral exudate, no LAN, Mallampati 3 airway, no stridor, poor dentition  Respiratory - equal breath sounds bilaterally, no wheezing or rales  CV - s1s2 regular rate and rhythm, no murmurs  Ext - no clubbing, no edema  Skin - no rashes  Psych - normal mood and affect   Pulmonary testing:    Chest Imaging:    Cardiac Tests:  Echo 10/31/19 >> EF 55 to 60%  Social History:  He  reports that he quit smoking about 48 years ago. His smoking use included cigarettes. He started smoking about 60 years ago. He has a 48 pack-year smoking history. He has never used smokeless tobacco. He reports that he does not currently use alcohol after a past  usage of about 1.0 - 2.0 standard drink of alcohol per week. He reports that he does not use drugs.  Family History:  His family history includes Diabetes in his maternal grandmother; Heart attack in his sister; Heart attack (age of onset: 35) in his mother; Pulmonary embolism (age of onset: 93) in his father.     Assessment/Plan:   Dyspnea on exertion with history of tobacco abuse. - concerned he could have COPD - trelegy caused blurred vision; suspect related to LAMA - continue breo 100 one puff daily - prn abuterol - will make sure his PFT gets scheduled  Snoring. - associated with sleep disruption, apnea, and daytime sleepiness - he has history of hypertension - will arrange for a home sleep study to assess for obstructive sleep apnea  Insomnia. - discussed stimulus control, sleep restriction and relaxation techniques - will reassess after his home sleep study is completed  Palpitations, Hyperlipidemia. - he is followed by cardiology in [place]  Time Spent Involved in Patient Care on Day of Examination:  37 minutes  Follow up:   Patient Instructions  Will make sure your pulmonary function test gets scheduled  Will arrange for a home sleep study  Will call to  arrange for follow up after sleep study reviewed   Medication List:   Allergies as of 07/16/2023       Reactions   Coconut (cocos Nucifera) Anaphylaxis   Coconut Oil Anaphylaxis   Iodine Anaphylaxis   Shellfish Allergy Anaphylaxis   Shellfish-derived Products Anaphylaxis   Amlodipine Cough   LE Swelling   Trelegy Ellipta [fluticasone-umeclidin-vilant] Other (See Comments)   Dizzy and vision issues-tried twice        Medication List        Accurate as of July 16, 2023 12:05 PM. If you have any questions, ask your nurse or doctor.          STOP taking these medications    FreeStyle Libre 2 Sensor Misc Stopped by: Coralyn Helling   Lancet Device Misc Stopped by: Coralyn Helling   onetouch  ultrasoft lancets Stopped by: Coralyn Helling   Trelegy Ellipta 100-62.5-25 MCG/ACT Aepb Generic drug: Fluticasone-Umeclidin-Vilant Stopped by: Coralyn Helling       TAKE these medications    albuterol 108 (90 Base) MCG/ACT inhaler Commonly known as: VENTOLIN HFA INHALE 1 TO 2 PUFFS EVERY 4 TO 6 HOURS AS NEEDED   Blood Glucose Monitoring Suppl Devi 1 each by Does not apply route in the morning, at noon, and at bedtime. May substitute to any manufacturer covered by patient's insurance.   BLOOD GLUCOSE TEST STRIPS Strp 1 each by In Vitro route in the morning, at noon, and at bedtime. May substitute to any manufacturer covered by patient's insurance.   Breo Ellipta 100-25 MCG/ACT Aepb Generic drug: fluticasone furoate-vilanterol Inhale 1 puff into the lungs daily.   fexofenadine 180 MG tablet Commonly known as: ALLEGRA Take 1 tablet (180 mg total) by mouth daily. For allergies   fluticasone 50 MCG/ACT nasal spray Commonly known as: FLONASE PLACE 1 SPRAY INTO BOTH NOSTRILS 2   TIMES DAILY.   Lancets Misc. Misc 1 each by Does not apply route in the morning, at noon, and at bedtime. May substitute to any manufacturer covered by patient's insurance.   latanoprost 0.005 % ophthalmic solution Commonly known as: XALATAN SMARTSIG:1 Drop(s) In Eye(s) Every Evening   metFORMIN 500 MG tablet Commonly known as: GLUCOPHAGE TAKE 2 TABLETS TWICE A DAY WITH MEALS FOR DIABETES   metoprolol succinate 25 MG 24 hr tablet Commonly known as: TOPROL-XL TAKE 1 TABLET EVERY DAY FOR HIGH BLOOD PRESSURE   montelukast 10 MG tablet Commonly known as: SINGULAIR TAKE 1 TABLET EVERY DAY AT BEDTIME FOR ALLERGIES/ASTHMA   omeprazole 20 MG capsule Commonly known as: PRILOSEC Take 40 mg by mouth daily.   rosuvastatin 5 MG tablet Commonly known as: Crestor Take 1 tablet (5 mg total) by mouth daily. for cholesterol.   tamsulosin 0.4 MG Caps capsule Commonly known as: FLOMAX TAKE 2 CAPSULES EVERY DAY  FOR URINE FLOW   tiZANidine 4 MG tablet Commonly known as: ZANAFLEX Take 1 tablet (4 mg total) by mouth at bedtime as needed for muscle spasms.        Signature:  Coralyn Helling, MD [facility] Pulmonary/Critical Care Pager - [phone] 07/16/2023, 12:05 PM

## 2023-07-21 DIAGNOSIS — H524 Presbyopia: Secondary | ICD-10-CM | POA: Diagnosis not present

## 2023-07-21 DIAGNOSIS — H52223 Regular astigmatism, bilateral: Secondary | ICD-10-CM | POA: Diagnosis not present

## 2023-07-21 DIAGNOSIS — Z7984 Long term (current) use of oral hypoglycemic drugs: Secondary | ICD-10-CM | POA: Diagnosis not present

## 2023-07-21 DIAGNOSIS — H401131 Primary open-angle glaucoma, bilateral, mild stage: Secondary | ICD-10-CM | POA: Diagnosis not present

## 2023-07-21 DIAGNOSIS — E119 Type 2 diabetes mellitus without complications: Secondary | ICD-10-CM | POA: Diagnosis not present

## 2023-07-21 DIAGNOSIS — Z961 Presence of intraocular lens: Secondary | ICD-10-CM | POA: Diagnosis not present

## 2023-08-03 ENCOUNTER — Other Ambulatory Visit: Payer: Self-pay | Admitting: Primary Care

## 2023-08-03 DIAGNOSIS — J4540 Moderate persistent asthma, uncomplicated: Secondary | ICD-10-CM

## 2023-08-11 ENCOUNTER — Encounter (HOSPITAL_BASED_OUTPATIENT_CLINIC_OR_DEPARTMENT_OTHER): Payer: Self-pay

## 2023-08-19 DIAGNOSIS — G4730 Sleep apnea, unspecified: Secondary | ICD-10-CM | POA: Diagnosis not present

## 2023-08-24 ENCOUNTER — Telehealth: Payer: Self-pay | Admitting: Pulmonary Disease

## 2023-08-24 NOTE — Telephone Encounter (Signed)
Refill for [facility] Inhaler

## 2023-08-27 ENCOUNTER — Other Ambulatory Visit (HOSPITAL_BASED_OUTPATIENT_CLINIC_OR_DEPARTMENT_OTHER): Payer: Self-pay

## 2023-08-27 MED ORDER — BREO ELLIPTA 100-25 MCG/ACT IN AEPB: 1.0000 | INHALATION_SPRAY | Freq: Every day | RESPIRATORY_TRACT | 1 refills | Status: DC

## 2023-08-27 NOTE — Telephone Encounter (Signed)
Refill sent.

## 2023-08-30 ENCOUNTER — Encounter (HOSPITAL_BASED_OUTPATIENT_CLINIC_OR_DEPARTMENT_OTHER): Payer: Medicare HMO

## 2023-09-07 DIAGNOSIS — H401131 Primary open-angle glaucoma, bilateral, mild stage: Secondary | ICD-10-CM | POA: Diagnosis not present

## 2023-09-08 ENCOUNTER — Ambulatory Visit (HOSPITAL_COMMUNITY)
Admission: RE | Admit: 2023-09-08 | Discharge: 2023-09-08 | Disposition: A | Discharge status: Home / Self Care | Payer: Medicare HMO | Source: Ambulatory Visit | Attending: Internal Medicine | Admitting: Internal Medicine

## 2023-09-08 DIAGNOSIS — R0683 Snoring: Secondary | ICD-10-CM | POA: Diagnosis not present

## 2023-09-08 DIAGNOSIS — E1169 Type 2 diabetes mellitus with other specified complication: Secondary | ICD-10-CM | POA: Insufficient documentation

## 2023-09-08 DIAGNOSIS — E785 Hyperlipidemia, unspecified: Secondary | ICD-10-CM | POA: Insufficient documentation

## 2023-09-15 ENCOUNTER — Ambulatory Visit (HOSPITAL_BASED_OUTPATIENT_CLINIC_OR_DEPARTMENT_OTHER): Payer: Medicare HMO | Admitting: Internal Medicine

## 2023-09-15 DIAGNOSIS — R0609 Other forms of dyspnea: Secondary | ICD-10-CM

## 2023-09-15 LAB — PULMONARY FUNCTION TEST
DL/VA % pred: 112 %
DL/VA: 4.44 ml/min/mmHg/L
DLCO cor % pred: 98 %
DLCO cor: 23.83 ml/min/mmHg
DLCO unc % pred: 98 %
DLCO unc: 23.83 ml/min/mmHg
FEF 25-75 Post: 1.68 L/s
FEF 25-75 Pre: 1.2 L/s
FEF2575-%Change-Post: 39 %
FEF2575-%Pred-Post: 82 %
FEF2575-%Pred-Pre: 59 %
FEV1-%Change-Post: 10 %
FEV1-%Pred-Post: 73 %
FEV1-%Pred-Pre: 66 %
FEV1-Post: 2.11 L
FEV1-Pre: 1.91 L
FEV1FVC-%Change-Post: -1 %
FEV1FVC-%Pred-Pre: 96 %
FEV6-%Change-Post: 11 %
FEV6-%Pred-Post: 81 %
FEV6-%Pred-Pre: 73 %
FEV6-Post: 3.03 L
FEV6-Pre: 2.73 L
FEV6FVC-%Change-Post: -1 %
FEV6FVC-%Pred-Post: 105 %
FEV6FVC-%Pred-Pre: 106 %
FVC-%Change-Post: 12 %
FVC-%Pred-Post: 77 %
FVC-%Pred-Pre: 68 %
FVC-Post: 3.08 L
FVC-Pre: 2.74 L
Post FEV1/FVC ratio: 69 %
Post FEV6/FVC ratio: 98 %
Pre FEV1/FVC ratio: 70 %
Pre FEV6/FVC Ratio: 99 %
RV % pred: 147 %
RV: 3.74 L
TLC % pred: 100 %
TLC: 6.88 L

## 2023-09-15 NOTE — Progress Notes (Signed)
Full PFT Performed Today  

## 2023-09-15 NOTE — Patient Instructions (Signed)
Full PFT Performed Today  

## 2023-09-21 ENCOUNTER — Ambulatory Visit (INDEPENDENT_AMBULATORY_CARE_PROVIDER_SITE_OTHER): Payer: Medicare HMO | Admitting: Primary Care

## 2023-09-21 ENCOUNTER — Encounter: Payer: Self-pay | Admitting: Primary Care

## 2023-09-21 VITALS — BP 124/68 | HR 64 | Temp 97.7°F | Ht 69.0 in | Wt 200.2 lb

## 2023-09-21 DIAGNOSIS — E1165 Type 2 diabetes mellitus with hyperglycemia: Secondary | ICD-10-CM

## 2023-09-21 DIAGNOSIS — Z7984 Long term (current) use of oral hypoglycemic drugs: Secondary | ICD-10-CM | POA: Diagnosis not present

## 2023-09-21 LAB — POCT GLYCOSYLATED HEMOGLOBIN (HGB A1C): Hemoglobin A1C: 7.5 % — AB (ref 4.0–5.6)

## 2023-09-21 LAB — MICROALBUMIN / CREATININE URINE RATIO
Creatinine,U: 119.6 mg/dL
Microalb Creat Ratio: 0.6 mg/g (ref 0.0–30.0)
Microalb, Ur: <0.7 mg/dL (ref 0.0–1.9)

## 2023-09-21 NOTE — Patient Instructions (Addendum)
Increase activity level. Keep working on [employer].   Call [facility] for Dr. Karel Jarvis to schedule the MRI. [phone]  Call the lung doctor to discuss lung test results   Please schedule a physical to meet with me in 6 months.   It was a pleasure to see you today!

## 2023-09-21 NOTE — Assessment & Plan Note (Addendum)
Slightly worse but overall controlled with A1C of 7.5 today.  Continue metformin 1000 mg BID.   Foot exam today. Urine microalbumin due and pending.  Follow up in 6 months.

## 2023-09-21 NOTE — Progress Notes (Signed)
Subjective:    Patient ID: Brian Mullen, male    DOB: 09-Aug-1946, 77 y.o.   MRN: [id number]  HPI  Brian Mullen is a very pleasant 77 y.o. male with a history of type 2 diabetes, hypertension, asthma, hyperlipidemia, chronic back pain, testosterone deficiency who presents today for follow-up of diabetes.  Current medications include: Metformin 1000 mg twice daily  He is checking his blood glucose on occasion and is getting readings of 130-140s.   Last A1C: 7.2 in May 2024, 7.5 today  Last Eye Exam: Up-to-date Last Foot Exam: Due Pneumonia Vaccination: 2020 Urine Microalbumin: Due Statin: Rosuvastatin  Dietary changes since last visit: Balanced diet with veggie, meat, starch.    Exercise: None.   BP Readings from Last 3 Encounters:  09/21/23 124/68  07/16/23 (!) 118/52  05/10/23 120/64   Wt Readings from Last 3 Encounters:  09/21/23 200 lb 3.2 oz (90.8 kg)  09/15/23 195 lb (88.5 kg)  07/16/23 200 lb 4.6 oz (90.9 kg)       Review of Systems  Eyes:  Negative for visual disturbance.  Respiratory:  Positive for shortness of breath.   Cardiovascular:  Negative for chest pain.  Neurological:  Positive for numbness.         Past Medical History:  Diagnosis Date   Allergy    Asthma    Cellulitis of left upper extremity 06/27/2021   Diabetes mellitus without complication (HCC)    GERD (gastroesophageal reflux disease)    Hypertension    Palpitations    a. 10/2019: Monitor showing rare PVC's and no significant arrhythmias.     Social History   Socioeconomic History   Marital status: Widowed    Spouse name: Not on file   Number of children: Not on file   Years of education: Not on file   Highest education level: 8th grade  Occupational History   Not on file  Tobacco Use   Smoking status: Former    Current packs/day: 0.00    Average packs/day: 4.0 packs/day for 12.0 years (48.0 ttl pk-yrs)    Types: Cigarettes    Start date: 46    Quit date: 54     Years since quitting: 48.8   Smokeless tobacco: Never   Tobacco comments:    used to smoke [employer]'s  Vaping Use   Vaping status: Never Used  Substance and Sexual Activity   Alcohol use: Not Currently    Alcohol/week: 1.0 - 2.0 standard drink of alcohol    Types: 1 - 2 Standard drinks or equivalent per week    Comment: occ   Drug use: No   Sexual activity: Not on file  Other Topics Concern   Not on file  Social History Narrative   Married.   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   Enjoys working on [employer], yard work, gardening.   Right handed    Social Determinants of Health   Financial Resource Strain: Low Risk  (09/20/2023)   Overall Financial Resource Strain (CARDIA)    Difficulty of Paying Living Expenses: Not hard at all  Food Insecurity: No Food Insecurity (09/20/2023)   Hunger Vital Sign    Worried About Running Out of Food in the Last Year: Never true    Ran Out of Food in the Last Year: Never true  Transportation Needs: No Transportation Needs (09/20/2023)   PRAPARE - Administrator, Civil Service (Medical): No    Lack  of Transportation (Non-Medical): No  Physical Activity: Insufficiently Active (09/20/2023)   Exercise Vital Sign    Days of Exercise per Week: 3 days    Minutes of Exercise per Session: 10 min  Stress: No Stress Concern Present (09/20/2023)   [employer] of Occupational Health - Occupational Stress Questionnaire    Feeling of Stress : Not at all  Social Connections: Moderately Isolated (09/20/2023)   Social Connection and Isolation Panel [NHANES]    Frequency of Communication with Friends and Family: Once a week    Frequency of Social Gatherings with Friends and Family: Once a week    Attends Religious Services: 1 to 4 times per year    Active Member of [employer] or Organizations: No    Attends Engineer, structural: Not on file    Marital Status: Living with partner  Intimate Partner Violence: Not on  file    Past Surgical History:  Procedure Laterality Date   CATARACT EXTRACTION Bilateral    02/23/2022, 03/28/2022   ELBOW SURGERY     KNEE SURGERY Right    TONSILLECTOMY      Family History  Problem Relation Age of Onset   Pulmonary embolism Father 16       Deceased   Heart attack Mother 36       Deceased   Diabetes Maternal Grandmother    Heart attack Sister     Allergies  Allergen Reactions   Coconut (Cocos Nucifera) Anaphylaxis   Coconut Oil Anaphylaxis   Iodine Anaphylaxis   Shellfish Allergy Anaphylaxis   Shellfish-Derived Products Anaphylaxis   Amlodipine Cough    LE Swelling   Trelegy Ellipta [Fluticasone-Umeclidin-Vilant] Other (See Comments)    Dizzy and vision issues-tried twice    Current Outpatient Medications on File Prior to Visit  Medication Sig Dispense Refill   albuterol (VENTOLIN HFA) 108 (90 Base) MCG/ACT inhaler INHALE 1 TO 2 PUFFS EVERY 4 TO 6 HOURS AS NEEDED 1 each 0   Blood Glucose Monitoring Suppl DEVI 1 each by Does not apply route in the morning, at noon, and at bedtime. May substitute to any manufacturer covered by patient's insurance. 1 each 0   BREO ELLIPTA 100-25 MCG/ACT AEPB Inhale 1 puff into the lungs daily. 180 each 1   fexofenadine (ALLEGRA) 180 MG tablet Take 1 tablet (180 mg total) by mouth daily. For allergies 90 tablet 1   fluticasone (FLONASE) 50 MCG/ACT nasal spray PLACE 1 SPRAY INTO BOTH NOSTRILS 2   TIMES DAILY. 48 g 1   Glucose Blood (BLOOD GLUCOSE TEST STRIPS) STRP 1 each by In Vitro route in the morning, at noon, and at bedtime. May substitute to any manufacturer covered by patient's insurance. 300 strip 1   Lancets Misc. MISC 1 each by Does not apply route in the morning, at noon, and at bedtime. May substitute to any manufacturer covered by patient's insurance. 300 each 1   latanoprost (XALATAN) 0.005 % ophthalmic solution SMARTSIG:1 Drop(s) In Eye(s) Every Evening     metFORMIN (GLUCOPHAGE) 500 MG tablet TAKE 2 TABLETS  TWICE A DAY WITH MEALS FOR DIABETES 360 tablet 0   metoprolol succinate (TOPROL-XL) 25 MG 24 hr tablet TAKE 1 TABLET EVERY DAY FOR HIGH BLOOD PRESSURE 90 tablet 2   montelukast (SINGULAIR) 10 MG tablet TAKE 1 TABLET EVERY DAY AT BEDTIME FOR ALLERGIES, ASTHMA 90 tablet 1   omeprazole (PRILOSEC) 20 MG capsule Take 40 mg by mouth daily.     rosuvastatin (CRESTOR) 5 MG tablet Take  1 tablet (5 mg total) by mouth daily. for cholesterol. 90 tablet 2   tamsulosin (FLOMAX) 0.4 MG CAPS capsule TAKE 2 CAPSULES EVERY DAY FOR URINE FLOW 180 capsule 2   tiZANidine (ZANAFLEX) 4 MG tablet Take 1 tablet (4 mg total) by mouth at bedtime as needed for muscle spasms. 30 tablet 0   No current facility-administered medications on file prior to visit.    BP 124/68   Pulse 64   Temp 97.7 F (36.5 C) (Temporal)   Ht 5\' 9"  (1.753 m)   Wt 200 lb 3.2 oz (90.8 kg)   SpO2 94%   BMI 29.56 kg/m  Objective:   Physical Exam Cardiovascular:     Rate and Rhythm: Normal rate and regular rhythm.  Pulmonary:     Effort: Pulmonary effort is normal.     Breath sounds: Normal breath sounds.  Musculoskeletal:     Cervical back: Neck supple.  Skin:    General: Skin is warm and dry.  Neurological:     Mental Status: He is alert and oriented to person, place, and time.  Psychiatric:        Mood and Affect: Mood normal.           Assessment & Plan:  Type 2 diabetes mellitus with hyperglycemia, without long-term current use of insulin (HCC) Assessment & Plan: Slightly worse but overall controlled with A1C of 7.5 today.  Continue metformin 1000 mg BID.   Foot exam today. Urine microalbumin due and pending.  Follow up in 6 months.  Orders: -     POCT glycosylated hemoglobin (Hb A1C) -     Microalbumin / creatinine urine ratio        Doreene Nest, NP

## 2023-09-24 ENCOUNTER — Telehealth: Payer: Self-pay | Admitting: Internal Medicine

## 2023-09-24 ENCOUNTER — Other Ambulatory Visit: Payer: Self-pay | Admitting: Primary Care

## 2023-09-24 DIAGNOSIS — E119 Type 2 diabetes mellitus without complications: Secondary | ICD-10-CM

## 2023-09-24 NOTE — Telephone Encounter (Signed)
Once released, patient would like to know the results of his PFT.

## 2023-09-29 NOTE — Telephone Encounter (Signed)
Mr Opperman's PFT showed moderate obstructive airways disease, with response to bronchodilator. This study was ordered by Dr Craige Cotta, who has now left our practice. I went ahead and read it. It looks as if he was being followed by Dr Craige Cotta here.  Mr Arcos is welcome to establish with a different pulmonologist in this practice, or transfer to Dr Craige Cotta at [facility].

## 2023-09-30 NOTE — Telephone Encounter (Signed)
Patient notified results of PFT and option to see another pulmonologist here or continue with [facility]. Patient will call back if he decides to stay with [facility].

## 2023-10-19 ENCOUNTER — Other Ambulatory Visit: Payer: Self-pay | Admitting: Primary Care

## 2023-10-19 DIAGNOSIS — J4540 Moderate persistent asthma, uncomplicated: Secondary | ICD-10-CM

## 2023-11-22 DIAGNOSIS — M542 Cervicalgia: Secondary | ICD-10-CM | POA: Diagnosis not present

## 2023-11-22 DIAGNOSIS — M62830 Muscle spasm of back: Secondary | ICD-10-CM | POA: Diagnosis not present

## 2023-11-22 DIAGNOSIS — M9901 Segmental and somatic dysfunction of cervical region: Secondary | ICD-10-CM | POA: Diagnosis not present

## 2023-11-22 DIAGNOSIS — M546 Pain in thoracic spine: Secondary | ICD-10-CM | POA: Diagnosis not present

## 2023-11-22 DIAGNOSIS — M9903 Segmental and somatic dysfunction of lumbar region: Secondary | ICD-10-CM | POA: Diagnosis not present

## 2023-11-22 DIAGNOSIS — M9902 Segmental and somatic dysfunction of thoracic region: Secondary | ICD-10-CM | POA: Diagnosis not present

## 2023-11-29 DIAGNOSIS — M542 Cervicalgia: Secondary | ICD-10-CM | POA: Diagnosis not present

## 2023-11-29 DIAGNOSIS — M9902 Segmental and somatic dysfunction of thoracic region: Secondary | ICD-10-CM | POA: Diagnosis not present

## 2023-11-29 DIAGNOSIS — M9903 Segmental and somatic dysfunction of lumbar region: Secondary | ICD-10-CM | POA: Diagnosis not present

## 2023-11-29 DIAGNOSIS — M62830 Muscle spasm of back: Secondary | ICD-10-CM | POA: Diagnosis not present

## 2023-11-29 DIAGNOSIS — M546 Pain in thoracic spine: Secondary | ICD-10-CM | POA: Diagnosis not present

## 2023-11-29 DIAGNOSIS — M9901 Segmental and somatic dysfunction of cervical region: Secondary | ICD-10-CM | POA: Diagnosis not present

## 2023-12-06 DIAGNOSIS — M62830 Muscle spasm of back: Secondary | ICD-10-CM | POA: Diagnosis not present

## 2023-12-06 DIAGNOSIS — M546 Pain in thoracic spine: Secondary | ICD-10-CM | POA: Diagnosis not present

## 2023-12-06 DIAGNOSIS — M9903 Segmental and somatic dysfunction of lumbar region: Secondary | ICD-10-CM | POA: Diagnosis not present

## 2023-12-06 DIAGNOSIS — M9902 Segmental and somatic dysfunction of thoracic region: Secondary | ICD-10-CM | POA: Diagnosis not present

## 2023-12-06 DIAGNOSIS — M9901 Segmental and somatic dysfunction of cervical region: Secondary | ICD-10-CM | POA: Diagnosis not present

## 2023-12-06 DIAGNOSIS — M542 Cervicalgia: Secondary | ICD-10-CM | POA: Diagnosis not present

## 2023-12-09 ENCOUNTER — Encounter (HOSPITAL_BASED_OUTPATIENT_CLINIC_OR_DEPARTMENT_OTHER): Payer: Self-pay | Admitting: Pulmonary Disease

## 2023-12-09 ENCOUNTER — Ambulatory Visit: Payer: Medicare HMO | Admitting: Pulmonary Disease

## 2023-12-09 ENCOUNTER — Ambulatory Visit (HOSPITAL_BASED_OUTPATIENT_CLINIC_OR_DEPARTMENT_OTHER): Payer: Medicare HMO | Admitting: Pulmonary Disease

## 2023-12-09 VITALS — BP 124/58 | HR 67 | Resp 14 | Ht 69.0 in | Wt 200.7 lb

## 2023-12-09 DIAGNOSIS — J4540 Moderate persistent asthma, uncomplicated: Secondary | ICD-10-CM

## 2023-12-09 DIAGNOSIS — R0683 Snoring: Secondary | ICD-10-CM | POA: Diagnosis not present

## 2023-12-09 NOTE — Progress Notes (Signed)
[facility]  Chief Complaint  Patient presents with   Establish Care    Former Dr Craige Cotta patient    Past Surgical History:  He  has a past surgical history that includes Tonsillectomy; Elbow surgery; Knee surgery (Right); and Cataract extraction (Bilateral).  Past Medical History:  Allergies, DM type 2, GERD, HTN  Constitutional:  BP (!) 124/58   Pulse 67   Resp 14   Ht 5\' 9"  (1.753 m)   Wt 200 lb 11.2 oz (91 kg)   SpO2 97%   BMI 29.64 kg/m   Brief Summary:  Brian Mullen is a 78 y.o. male former smoker with dyspnea.      Subjective:  12/09/23 He presents for PFT and HST follow-up. He continues to have productive cough in the morning. He has Breo which he takes three times a week. Does feel like this is helping. Has shortness of breath with cold weather or walking uphill. No wheezing.  Physical Exam:   Physical Exam: General: Well-appearing, no acute distress HENT: [place], AT Eyes: EOMI, no scleral icterus Respiratory: Clear to auscultation bilaterally.  No crackles, wheezing or rales Cardiovascular: RRR, -M/R/G, no JVD Extremities:-Edema,-tenderness Neuro: AAO x4, CNII-XII grossly intact Psych: Normal mood, normal affect    Pulmonary testing:  09/15/23 FVC 3.08 (77%) FEV1 2.11 (73%) Ratio 69  TLC 100% RV 147% RV/TLC 143% DLCO 98% Interpretation: Normal PFTs. Significant bronchodilator response   Chest Imaging:  CT Cardiac 09/08/23 -Visualized lung parenchyma with linear subsegmental atelectasis/scarring otherwise no pulmonary nodules, masses, infiltrate, effusion or pneumothorax.   Cardiac Tests:  Echo 10/31/19 >> EF 55 to 60%  Social History:  He  reports that he quit smoking about 49 years ago. His smoking use included cigarettes. He started smoking about 61 years ago. He has a 48 pack-year smoking history. He has never used smokeless tobacco. He reports that he does not currently use alcohol after a past usage of  about 1.0 - 2.0 standard drink of alcohol per week. He reports that he does not use drugs.  Family History:  His family history includes Diabetes in his maternal grandmother; Heart attack in his sister; Heart attack (age of onset: 3) in his mother; Pulmonary embolism (age of onset: 61) in his father.     Assessment/Plan:   Moderate asthma with airtrapping, +significant bronchodilator response - Reviewed PFTs. Consistent with asthma - trelegy caused blurred vision; suspect related to LAMA - CONTINUE Breo 100 one puff daily - prn abuterol  Snoring. - associated with sleep disruption, apnea, and daytime sleepiness - he has history of hypertension - HST negative for sleep apnea. Reviewed results    Time Spent Involved in Patient Care on Day of Examination:  32 minutes  Follow up:   Patient Instructions  Moderate asthma with airtrapping, +significant bronchodilator response - trelegy caused blurred vision; suspect related to LAMA - Reviewed PFTs. - CONTINUE Breo 100 one puff daily - prn abuterol  Snoring. - associated with sleep disruption, apnea, and daytime sleepiness - he has history of hypertension - HST negative for sleep apnea. Reviewed results with pateint  Medication List:   Allergies as of 12/09/2023       Reactions   Coconut (cocos Nucifera) Anaphylaxis   Coconut Oil Anaphylaxis   Iodine Anaphylaxis   Shellfish Allergy Anaphylaxis   Shellfish-derived Products Anaphylaxis   Amlodipine Cough   LE Swelling   Trelegy Ellipta [fluticasone-umeclidin-vilant] Other (See Comments)   Dizzy and vision issues-tried  twice        Medication List        Accurate as of December 09, 2023  9:20 AM. If you have any questions, ask your nurse or doctor.          albuterol 108 (90 Base) MCG/ACT inhaler Commonly known as: VENTOLIN HFA INHALE 1 TO 2 PUFFS EVERY 4 TO 6 HOURS AS NEEDED   Blood Glucose Monitoring Suppl Devi 1 each by Does not apply route in the  morning, at noon, and at bedtime. May substitute to any manufacturer covered by patient's insurance.   BLOOD GLUCOSE TEST STRIPS Strp 1 each by In Vitro route in the morning, at noon, and at bedtime. May substitute to any manufacturer covered by patient's insurance.   Breo Ellipta 100-25 MCG/ACT Aepb Generic drug: fluticasone furoate-vilanterol Inhale 1 puff into the lungs daily.   fexofenadine 180 MG tablet Commonly known as: ALLEGRA Take 1 tablet (180 mg total) by mouth daily. For allergies   fluticasone 50 MCG/ACT nasal spray Commonly known as: FLONASE PLACE 1 SPRAY INTO BOTH NOSTRILS 2   TIMES DAILY.   Lancets Misc. Misc 1 each by Does not apply route in the morning, at noon, and at bedtime. May substitute to any manufacturer covered by patient's insurance.   latanoprost 0.005 % ophthalmic solution Commonly known as: XALATAN SMARTSIG:1 Drop(s) In Eye(s) Every Evening   metFORMIN 500 MG tablet Commonly known as: GLUCOPHAGE TAKE 2 TABLETS TWICE A DAY WITH MEALS FOR DIABETES   metoprolol succinate 25 MG 24 hr tablet Commonly known as: TOPROL-XL TAKE 1 TABLET EVERY DAY FOR HIGH BLOOD PRESSURE   montelukast 10 MG tablet Commonly known as: SINGULAIR TAKE 1 TABLET EVERY DAY AT BEDTIME FOR ALLERGIES, ASTHMA   omeprazole 20 MG capsule Commonly known as: PRILOSEC Take 40 mg by mouth daily.   rosuvastatin 5 MG tablet Commonly known as: Crestor Take 1 tablet (5 mg total) by mouth daily. for cholesterol.   tamsulosin 0.4 MG Caps capsule Commonly known as: FLOMAX TAKE 2 CAPSULES EVERY DAY FOR URINE FLOW   tiZANidine 4 MG tablet Commonly known as: ZANAFLEX Take 1 tablet (4 mg total) by mouth at bedtime as needed for muscle spasms.        Signature:  Mechele Collin, MD [facility] Pulmonary/Critical Care Pager - [phone] 12/09/2023, 9:20 AM

## 2023-12-09 NOTE — Patient Instructions (Addendum)
Moderate asthma with airtrapping, +significant bronchodilator response - trelegy caused blurred vision; suspect related to LAMA - Reviewed PFTs. - CONTINUE Breo 100 one puff daily - prn abuterol  Snoring. - associated with sleep disruption, apnea, and daytime sleepiness - he has history of hypertension - HST negative for sleep apnea. Reviewed results with pateint

## 2023-12-15 DIAGNOSIS — M546 Pain in thoracic spine: Secondary | ICD-10-CM | POA: Diagnosis not present

## 2023-12-15 DIAGNOSIS — M9901 Segmental and somatic dysfunction of cervical region: Secondary | ICD-10-CM | POA: Diagnosis not present

## 2023-12-15 DIAGNOSIS — M62830 Muscle spasm of back: Secondary | ICD-10-CM | POA: Diagnosis not present

## 2023-12-15 DIAGNOSIS — M9902 Segmental and somatic dysfunction of thoracic region: Secondary | ICD-10-CM | POA: Diagnosis not present

## 2023-12-15 DIAGNOSIS — M9903 Segmental and somatic dysfunction of lumbar region: Secondary | ICD-10-CM | POA: Diagnosis not present

## 2023-12-15 DIAGNOSIS — M542 Cervicalgia: Secondary | ICD-10-CM | POA: Diagnosis not present

## 2023-12-22 ENCOUNTER — Other Ambulatory Visit: Payer: Self-pay | Admitting: Primary Care

## 2023-12-22 DIAGNOSIS — M9901 Segmental and somatic dysfunction of cervical region: Secondary | ICD-10-CM | POA: Diagnosis not present

## 2023-12-22 DIAGNOSIS — M62830 Muscle spasm of back: Secondary | ICD-10-CM | POA: Diagnosis not present

## 2023-12-22 DIAGNOSIS — M9903 Segmental and somatic dysfunction of lumbar region: Secondary | ICD-10-CM | POA: Diagnosis not present

## 2023-12-22 DIAGNOSIS — M546 Pain in thoracic spine: Secondary | ICD-10-CM | POA: Diagnosis not present

## 2023-12-22 DIAGNOSIS — I10 Essential (primary) hypertension: Secondary | ICD-10-CM

## 2023-12-22 DIAGNOSIS — M542 Cervicalgia: Secondary | ICD-10-CM | POA: Diagnosis not present

## 2023-12-22 DIAGNOSIS — M9902 Segmental and somatic dysfunction of thoracic region: Secondary | ICD-10-CM | POA: Diagnosis not present

## 2024-01-05 DIAGNOSIS — M542 Cervicalgia: Secondary | ICD-10-CM | POA: Diagnosis not present

## 2024-01-05 DIAGNOSIS — M62830 Muscle spasm of back: Secondary | ICD-10-CM | POA: Diagnosis not present

## 2024-01-05 DIAGNOSIS — M9903 Segmental and somatic dysfunction of lumbar region: Secondary | ICD-10-CM | POA: Diagnosis not present

## 2024-01-05 DIAGNOSIS — M9901 Segmental and somatic dysfunction of cervical region: Secondary | ICD-10-CM | POA: Diagnosis not present

## 2024-01-05 DIAGNOSIS — M9902 Segmental and somatic dysfunction of thoracic region: Secondary | ICD-10-CM | POA: Diagnosis not present

## 2024-01-05 DIAGNOSIS — M546 Pain in thoracic spine: Secondary | ICD-10-CM | POA: Diagnosis not present

## 2024-01-06 ENCOUNTER — Other Ambulatory Visit: Payer: Self-pay | Admitting: Primary Care

## 2024-01-06 DIAGNOSIS — E1169 Type 2 diabetes mellitus with other specified complication: Secondary | ICD-10-CM

## 2024-01-06 NOTE — Telephone Encounter (Signed)
 Patient has been scheduled

## 2024-01-06 NOTE — Telephone Encounter (Signed)
 Patient is due for CPE/follow up in mid May, this will be required prior to any further refills.  Please schedule, thank you!

## 2024-01-14 ENCOUNTER — Ambulatory Visit: Payer: Medicare HMO

## 2024-01-14 VITALS — Ht 69.0 in | Wt 194.4 lb

## 2024-01-14 DIAGNOSIS — Z0000 Encounter for general adult medical examination without abnormal findings: Secondary | ICD-10-CM

## 2024-01-14 NOTE — Progress Notes (Signed)
 Subjective:   Brian Mullen is a 78 y.o. who presents for a Medicare Wellness preventive visit.  Visit Complete: Virtual I connected with  Brian Mullen on 01/14/24 by a video and audio enabled telemedicine application and verified that I am speaking with the correct person using two identifiers.  Patient Location: Home  Provider Location: Office/Clinic  I discussed the limitations of evaluation and management by telemedicine. The patient expressed understanding and agreed to proceed.  Vital Signs: Because this visit was a virtual/telehealth visit, some criteria may be missing or patient reported. Any vitals not documented were not able to be obtained and vitals that have been documented are patient reported.   AWV Questionnaire: No: Patient Medicare AWV questionnaire was not completed prior to this visit.  Cardiac Risk Factors include: advanced age (>53men, >17 women);diabetes mellitus;dyslipidemia;male gender;hypertension;sedentary lifestyle    Objective:    Today's Vitals   01/14/24 1018 01/14/24 1019  Weight: 194 lb 6.4 oz (88.2 kg)   Height: 5\' 9"  (1.753 m)   PainSc:  8    Body mass index is 28.71 kg/m.     01/14/2024   10:35 AM 01/10/2023    6:22 AM 11/18/2022   10:22 AM 11/11/2022    8:24 AM 11/05/2022    1:39 PM 09/29/2022    4:24 PM 02/26/2021   11:25 AM  Advanced Directives  Does Patient Have a Medical Advance Directive? Yes Yes Yes Yes No Yes No  Type of Estate agent of [place];Living will Healthcare Power of [employer] of [place];Living will;Out of facility DNR (pink MOST or yellow form) Living will;Healthcare Power of [employer] of [place];Living will   Does patient want to make changes to medical advance directive?    No - Patient declined     Copy of Healthcare Power of Attorney in Chart? Yes - validated most recent copy scanned in chart (See row information)     Yes - validated most recent copy scanned in  chart (See row information)   Would patient like information on creating a medical advance directive?    No - Patient declined No - Patient declined  No - Patient declined    Current Medications (verified) Outpatient Encounter Medications as of 01/14/2024  Medication Sig   albuterol (VENTOLIN HFA) 108 (90 Base) MCG/ACT inhaler INHALE 1 TO 2 PUFFS EVERY 4 TO 6 HOURS AS NEEDED   Blood Glucose Monitoring Suppl DEVI 1 each by Does not apply route in the morning, at noon, and at bedtime. May substitute to any manufacturer covered by patient's insurance.   fexofenadine (ALLEGRA) 180 MG tablet Take 1 tablet (180 mg total) by mouth daily. For allergies   fluticasone (FLONASE) 50 MCG/ACT nasal spray PLACE 1 SPRAY INTO BOTH NOSTRILS 2   TIMES DAILY.   Glucose Blood (BLOOD GLUCOSE TEST STRIPS) STRP 1 each by In Vitro route in the morning, at noon, and at bedtime. May substitute to any manufacturer covered by patient's insurance.   Lancets Misc. MISC 1 each by Does not apply route in the morning, at noon, and at bedtime. May substitute to any manufacturer covered by patient's insurance.   latanoprost (XALATAN) 0.005 % ophthalmic solution SMARTSIG:1 Drop(s) In Eye(s) Every Evening   metFORMIN (GLUCOPHAGE) 500 MG tablet TAKE 2 TABLETS TWICE A DAY WITH MEALS FOR DIABETES   metoprolol succinate (TOPROL-XL) 25 MG 24 hr tablet TAKE 1 TABLET EVERY DAY FOR HIGH BLOOD PRESSURE   montelukast (SINGULAIR) 10 MG tablet TAKE 1  TABLET EVERY DAY AT BEDTIME FOR ALLERGIES, ASTHMA   omeprazole (PRILOSEC) 20 MG capsule Take 40 mg by mouth daily.   rosuvastatin (CRESTOR) 5 MG tablet Take 1 tablet (5 mg total) by mouth daily. for cholesterol.   tamsulosin (FLOMAX) 0.4 MG CAPS capsule TAKE 2 CAPSULES EVERY DAY FOR URINE FLOW   tiZANidine (ZANAFLEX) 4 MG tablet Take 1 tablet (4 mg total) by mouth at bedtime as needed for muscle spasms.   BREO ELLIPTA 100-25 MCG/ACT AEPB Inhale 1 puff into the lungs daily. (Patient not taking:  Reported on 01/14/2024)   No facility-administered encounter medications on file as of 01/14/2024.    Allergies (verified) Coconut (cocos nucifera), Coconut oil, Iodine, Shellfish allergy, Shellfish-derived products, Amlodipine, and Trelegy ellipta [fluticasone-umeclidin-vilant]   History: Past Medical History:  Diagnosis Date   Allergy    Asthma    Cellulitis of left upper extremity 06/27/2021   Diabetes mellitus without complication (HCC)    GERD (gastroesophageal reflux disease)    Hypertension    Palpitations    a. 10/2019: Monitor showing rare PVC's and no significant arrhythmias.    Past Surgical History:  Procedure Laterality Date   CATARACT EXTRACTION Bilateral    02/23/2022, 03/28/2022   ELBOW SURGERY     KNEE SURGERY Right    TONSILLECTOMY     Family History  Problem Relation Age of Onset   Pulmonary embolism Father 73       Deceased   Heart attack Mother 40       Deceased   Diabetes Maternal Grandmother    Heart attack Sister    Social History   Socioeconomic History   Marital status: Widowed    Spouse name: Not on file   Number of children: Not on file   Years of education: Not on file   Highest education level: 8th grade  Occupational History   Not on file  Tobacco Use   Smoking status: Former    Current packs/day: 0.00    Average packs/day: 4.0 packs/day for 12.0 years (48.0 ttl pk-yrs)    Types: Cigarettes    Start date: 29    Quit date: 15    Years since quitting: 49.1   Smokeless tobacco: Never   Tobacco comments:    used to smoke Lucky's  Vaping Use   Vaping status: Never Used  Substance and Sexual Activity   Alcohol use: Not Currently    Alcohol/week: 1.0 - 2.0 standard drink of alcohol    Types: 1 - 2 Standard drinks or equivalent per week    Comment: occ   Drug use: No   Sexual activity: Not on file  Other Topics Concern   Not on file  Social History Narrative   Married.   No children   Retried. Worked as a Comptroller.   Enjoys working on [employer], yard work, gardening.   Right handed    Social Drivers of Health   Financial Resource Strain: Low Risk  (01/14/2024)   Overall Financial Resource Strain (CARDIA)    Difficulty of Paying Living Expenses: Not hard at all  Food Insecurity: No Food Insecurity (01/14/2024)   Hunger Vital Sign    Worried About Running Out of Food in the Last Year: Never true    Ran Out of Food in the Last Year: Never true  Transportation Needs: No Transportation Needs (01/14/2024)   PRAPARE - Administrator, Civil Service (Medical): No    Lack of Transportation (  Non-Medical): No  Physical Activity: Inactive (01/14/2024)   Exercise Vital Sign    Days of Exercise per Week: 0 days    Minutes of Exercise per Session: 0 min  Stress: No Stress Concern Present (01/14/2024)   [employer] of Occupational Health - Occupational Stress Questionnaire    Feeling of Stress : Not at all  Social Connections: Socially Isolated (01/14/2024)   Social Connection and Isolation Panel [NHANES]    Frequency of Communication with Friends and Family: Once a week    Frequency of Social Gatherings with Friends and Family: Never    Attends Religious Services: Never    Database administrator or Organizations: No    Attends Banker Meetings: Never    Marital Status: Widowed    Tobacco Counseling Counseling given: Not Answered Tobacco comments: used to smoke Lucky's    Clinical Intake:  Pre-visit preparation completed: Yes  Pain : 0-10 Pain Score: 8  Pain Type: Chronic pain Pain Location: Knee (both knees and legs) Pain Descriptors / Indicators: Aching Pain Onset: More than a month ago Pain Frequency: Constant Pain Relieving Factors: voltern gel,rest legs Effect of Pain on Daily Activities: cannot stand for periods of time or walk long distances  Pain Relieving Factors: voltern gel,rest legs  BMI - recorded: 28.71 Nutritional Status: BMI 25 -29  Overweight Nutritional Risks: None Diabetes: Yes CBG done?: Yes (BS 130 this am at home) CBG resulted in Enter/ Edit results?: No Did pt. bring in CBG monitor from home?: No  How often do you need to have someone help you when you read instructions, pamphlets, or other written materials from your doctor or pharmacy?: 1 - Never  Interpreter Needed?: No  Comments: others live with pt Information entered by :: B.Kamill Fulbright,LPN   Activities of Daily Living     01/14/2024   10:35 AM  In your present state of health, do you have any difficulty performing the following activities:  Hearing? 1  Vision? 0  Difficulty concentrating or making decisions? 1  Walking or climbing stairs? 1  Dressing or bathing? 0  Doing errands, shopping? 0  Preparing Food and eating ? N  Using the Toilet? N  In the past six months, have you accidently leaked urine? Y  Do you have problems with loss of bowel control? N  Managing your Medications? N  Managing your Finances? N  Housekeeping or managing your Housekeeping? Y    Patient Care Team: Doreene Nest, NP as PCP - General (Nurse Practitioner) Christell Constant, MD as PCP - Cardiology (Cardiology) Van Clines, MD as Consulting Physician (Neurology)  Indicate any recent Medical Services you may have received from other than [facility] providers in the past year (date may be approximate).     Assessment:   This is a routine wellness examination for Brian Mullen.  Hearing/Vision screen Hearing Screening - Comments:: Hears sufficiently except background noices Vision Screening - Comments:: Pt says vision is better w/glasses and after eye surgery [facility] Dr Valentina Lucks   Goals Addressed             This Visit's Progress    Patient Stated   On track    01/14/24- I will continue to take medications as prescribed.      Patient Stated   Not on track    01/14/24- wants to get around without pain       Depression Screen     01/14/2024    10:29 AM 09/21/2023  9:31 AM 03/24/2023   11:52 AM 09/29/2022    4:25 PM 03/31/2022   10:21 AM 03/28/2021   11:26 AM 09/06/2020   11:11 AM  PHQ 2/9 Scores  PHQ - 2 Score 0 0 0 0 0 0 0  PHQ- 9 Score  6   7 6 9     Fall Risk     01/14/2024   10:23 AM 09/21/2023    9:31 AM 03/24/2023   11:52 AM 11/18/2022   10:22 AM 09/29/2022    4:25 PM  Fall Risk   Falls in the past year? 0 0 0 0 0  Number falls in past yr: 0 0 0 0 0  Injury with Fall? 0 0 0 0 0  Risk for fall due to : No Fall Risks No Fall Risks No Fall Risks  Medication side effect  Follow up Education provided;Falls prevention discussed Falls evaluation completed Falls evaluation completed Falls evaluation completed Falls prevention discussed;Education provided;Falls evaluation completed    MEDICARE RISK AT HOME:  Medicare Risk at Home Any stairs in or around the home?: Yes If so, are there any without handrails?: Yes Home free of loose throw rugs in walkways, pet beds, electrical cords, etc?: Yes Adequate lighting in your home to reduce risk of falls?: Yes Life alert?: No Use of a cane, walker or w/c?: Yes (cane and scotter) Grab bars in the bathroom?: Yes Shower chair or bench in shower?: Yes Elevated toilet seat or a handicapped toilet?: No  TIMED UP AND GO:  Was the test performed?  No  Cognitive Function: 6CIT completed    03/06/2019    9:17 AM 02/22/2018    8:29 AM 11/30/2016   11:59 AM  MMSE - Mini Mental State Exam  Orientation to time 5 5 5   Orientation to Place 5 5 5   Registration 3 3 3   Attention/ Calculation 0 0 0  Recall 2 3 3   Recall-comments unable to recall 1 of 3 words    Language- name 2 objects 0 0 0  Language- repeat 1 1 1   Language- follow 3 step command 0 3 3  Language- read & follow direction 0 0 0  Write a sentence 0 0 0  Copy design 0 0 0  Total score 16 20 20         01/14/2024   10:38 AM 09/29/2022    4:27 PM  6CIT Screen  What Year? 0 points 0 points  What month? 0 points 0 points   What time? 0 points 0 points  Count back from 20 0 points 0 points  Months in reverse 4 points 4 points  Repeat phrase 10 points 2 points  Total Score 14 points 6 points    Immunizations Immunization History  Administered Date(s) Administered   Fluad Quad(high Dose 65+) 09/30/2021   Influenza,inj,Quad PF,6+ Mos 08/18/2013, 10/29/2014, 11/30/2016, 06/27/2019   Influenza-Unspecified 08/18/2013, 10/29/2014, 11/30/2016, 06/27/2019, 09/30/2021   Pneumococcal Conjugate-13 09/22/2013   Pneumococcal Polysaccharide-23 10/29/2014, 09/08/2019   Td 09/22/2013   Td (Adult) 09/22/2013   Zoster, Live 12/14/2016   Zoster, Unspecified 06/21/2023    Screening Tests Health Maintenance  Topic Date Due   Zoster Vaccines- Shingrix (1 of 2) 12/29/1995   DTaP/Tdap/Td (3 - Tdap) 09/23/2023   INFLUENZA VACCINE  02/14/2024 (Originally 06/17/2023)   OPHTHALMOLOGY EXAM  03/23/2024 (Originally 03/01/2024)   HEMOGLOBIN A1C  03/20/2024   Diabetic kidney evaluation - eGFR measurement  03/23/2024   Diabetic kidney evaluation - Urine ACR  09/20/2024  FOOT EXAM  09/20/2024   Medicare Annual Wellness (AWV)  01/13/2025   Pneumonia Vaccine 30+ Years old  Completed   Hepatitis C Screening  Completed   HPV VACCINES  Aged Out   COVID-19 Vaccine  Discontinued   Fecal DNA (Cologuard)  Discontinued    Health Maintenance  Health Maintenance Due  Topic Date Due   Zoster Vaccines- Shingrix (1 of 2) 12/29/1995   DTaP/Tdap/Td (3 - Tdap) 09/23/2023   Health Maintenance Items Addressed: none needed   Additional Screening:  Vision Screening: Recommended annual ophthalmology exams for early detection of glaucoma and other disorders of the eye.  Dental Screening: Recommended annual dental exams for proper oral hygiene  Community Resource Referral / Chronic Care Management: CRR required this visit?  No   CCM required this visit?  No    Plan:     I have personally reviewed and noted the following in the  patient's chart:   Medical and social history Use of alcohol, tobacco or illicit drugs  Current medications and supplements including opioid prescriptions. Patient is not currently taking opioid prescriptions. Functional ability and status Nutritional status Physical activity Advanced directives List of other physicians Hospitalizations, surgeries, and ER visits in previous 12 months Vitals Screenings to include cognitive, depression, and falls Referrals and appointments  In addition, I have reviewed and discussed with patient certain preventive protocols, quality metrics, and best practice recommendations. A written personalized care plan for preventive services as well as general preventive health recommendations were provided to patient.    Sue Lush, LPN   1/61/0960   After Visit Summary: (MyChart) Due to this being a telephonic visit, the after visit summary with patients personalized plan was offered to patient via MyChart   Notes:  Pt indicates he has completed the rx for Brio  Ellipta but wants to possibly switch back to Spiriva because the Brio makes his chest feel "funny, like it is expanding too much". Pt has no other concerns or questions at this time

## 2024-01-14 NOTE — Patient Instructions (Signed)
 Brian Mullen , Thank you for taking time to come for your Medicare Wellness Visit. I appreciate your ongoing commitment to your health goals. Please review the following plan we discussed and let me know if I can assist you in the future.   Referrals/Orders/Follow-Ups/Clinician Recommendations: none  This is a list of the screening recommended for you and due dates:  Health Maintenance  Topic Date Due   Zoster (Shingles) Vaccine (1 of 2) 12/29/1995   DTaP/Tdap/Td vaccine (3 - Tdap) 09/23/2023   Flu Shot  02/14/2024*   Eye exam for diabetics  03/23/2024*   Hemoglobin A1C  03/20/2024   Yearly kidney function blood test for diabetes  03/23/2024   Yearly kidney health urinalysis for diabetes  09/20/2024   Complete foot exam   09/20/2024   Medicare Annual Wellness Visit  01/13/2025   Pneumonia Vaccine  Completed   Hepatitis C Screening  Completed   HPV Vaccine  Aged Out   COVID-19 Vaccine  Discontinued   Cologuard (Stool DNA test)  Discontinued  *Topic was postponed. The date shown is not the original due date.    Advanced directives: (In Chart) A copy of your advanced directives are scanned into your chart should your provider ever need it.  Next Medicare Annual Wellness Visit scheduled for next year: Yes 01/17/25 @ 10:50am video

## 2024-01-19 DIAGNOSIS — M9902 Segmental and somatic dysfunction of thoracic region: Secondary | ICD-10-CM | POA: Diagnosis not present

## 2024-01-19 DIAGNOSIS — M9901 Segmental and somatic dysfunction of cervical region: Secondary | ICD-10-CM | POA: Diagnosis not present

## 2024-01-19 DIAGNOSIS — M9903 Segmental and somatic dysfunction of lumbar region: Secondary | ICD-10-CM | POA: Diagnosis not present

## 2024-01-19 DIAGNOSIS — M546 Pain in thoracic spine: Secondary | ICD-10-CM | POA: Diagnosis not present

## 2024-01-19 DIAGNOSIS — M542 Cervicalgia: Secondary | ICD-10-CM | POA: Diagnosis not present

## 2024-01-19 DIAGNOSIS — M62830 Muscle spasm of back: Secondary | ICD-10-CM | POA: Diagnosis not present

## 2024-01-28 ENCOUNTER — Telehealth: Payer: Self-pay | Admitting: Primary Care

## 2024-01-28 DIAGNOSIS — I10 Essential (primary) hypertension: Secondary | ICD-10-CM

## 2024-01-28 NOTE — Telephone Encounter (Signed)
 Copied from CRM [id number]. Topic: Clinical - Prescription Issue >> Jan 28, 2024  9:53 AM Brian Mullen wrote: Reason for CRM: Pt called regarding BREO ELLIPTA 100-25 MCG/ACT AEPB. Pt states he would like to change the medication to some thing more affordable. Pt said the cost of the medication is $297, he can not pay for the medication. Please call pt back to discuss other option for medication. [phone]

## 2024-01-29 NOTE — Telephone Encounter (Signed)
 Recommend patient call and speak with pulmonologist's office. Thanks.

## 2024-01-31 MED ORDER — METOPROLOL SUCCINATE ER 25 MG PO TB24
25.0000 mg | ORAL_TABLET | Freq: Every day | ORAL | 0 refills | Status: DC
Start: 2024-01-31 — End: 2024-04-14

## 2024-01-31 NOTE — Telephone Encounter (Signed)
 Noted, Rx sent to pharmacy.

## 2024-01-31 NOTE — Telephone Encounter (Signed)
 Called and advised patient of Brian Mullen message. Patient will reach out to pulmonology office.   Patient is requesting refill on metoprolol.

## 2024-01-31 NOTE — Addendum Note (Signed)
 Addended by: Doreene Nest on: 01/31/2024 04:36 PM   Modules accepted: Orders

## 2024-02-02 DIAGNOSIS — M62830 Muscle spasm of back: Secondary | ICD-10-CM | POA: Diagnosis not present

## 2024-02-02 DIAGNOSIS — M9901 Segmental and somatic dysfunction of cervical region: Secondary | ICD-10-CM | POA: Diagnosis not present

## 2024-02-02 DIAGNOSIS — M9902 Segmental and somatic dysfunction of thoracic region: Secondary | ICD-10-CM | POA: Diagnosis not present

## 2024-02-02 DIAGNOSIS — M9903 Segmental and somatic dysfunction of lumbar region: Secondary | ICD-10-CM | POA: Diagnosis not present

## 2024-02-02 DIAGNOSIS — M542 Cervicalgia: Secondary | ICD-10-CM | POA: Diagnosis not present

## 2024-02-02 DIAGNOSIS — M546 Pain in thoracic spine: Secondary | ICD-10-CM | POA: Diagnosis not present

## 2024-02-03 ENCOUNTER — Telehealth (HOSPITAL_BASED_OUTPATIENT_CLINIC_OR_DEPARTMENT_OTHER): Payer: Self-pay | Admitting: Pulmonary Disease

## 2024-02-03 NOTE — Telephone Encounter (Signed)
 Reassured patient that he is on the lowest dose of Breo available. He reports symptoms are well controlled and would like to try Breo every other day. I did not recommend spiriva bc of possible side effect of visual blurriness related to LAMA portion of Trelegy in the past.  We also discussed that pricing of inhaler likely related to unmet deductible.  Plan Ok to step down to [place] daily as needed. Patient plans to take every other day

## 2024-02-03 NOTE — Telephone Encounter (Signed)
 Patient would like to speak with Dr. Everardo All advised I would take a message- Patient is worried regarding prolonged use with breo. Asking if there is something that was "one step down" from breo that patient could use possibly less potent?   Used for 4 mths starting to feel that chest and airways were opening too much? Gave it one more shot with his recent refill and now script is $297 per month now instead of 45. Patient asking if we could possibly do Spiriva?   Please advise.

## 2024-02-03 NOTE — Telephone Encounter (Signed)
**Note De-identified  [place] Obfuscation** Please advise 

## 2024-02-08 ENCOUNTER — Other Ambulatory Visit: Payer: Self-pay | Admitting: Primary Care

## 2024-02-08 DIAGNOSIS — R252 Cramp and spasm: Secondary | ICD-10-CM

## 2024-02-08 DIAGNOSIS — J4540 Moderate persistent asthma, uncomplicated: Secondary | ICD-10-CM

## 2024-02-08 DIAGNOSIS — G8929 Other chronic pain: Secondary | ICD-10-CM

## 2024-02-16 DIAGNOSIS — M9902 Segmental and somatic dysfunction of thoracic region: Secondary | ICD-10-CM | POA: Diagnosis not present

## 2024-02-16 DIAGNOSIS — M542 Cervicalgia: Secondary | ICD-10-CM | POA: Diagnosis not present

## 2024-02-16 DIAGNOSIS — M62830 Muscle spasm of back: Secondary | ICD-10-CM | POA: Diagnosis not present

## 2024-02-16 DIAGNOSIS — M9901 Segmental and somatic dysfunction of cervical region: Secondary | ICD-10-CM | POA: Diagnosis not present

## 2024-02-16 DIAGNOSIS — M9903 Segmental and somatic dysfunction of lumbar region: Secondary | ICD-10-CM | POA: Diagnosis not present

## 2024-02-16 DIAGNOSIS — M546 Pain in thoracic spine: Secondary | ICD-10-CM | POA: Diagnosis not present

## 2024-02-28 DIAGNOSIS — M542 Cervicalgia: Secondary | ICD-10-CM | POA: Diagnosis not present

## 2024-02-28 DIAGNOSIS — M9902 Segmental and somatic dysfunction of thoracic region: Secondary | ICD-10-CM | POA: Diagnosis not present

## 2024-02-28 DIAGNOSIS — M546 Pain in thoracic spine: Secondary | ICD-10-CM | POA: Diagnosis not present

## 2024-02-28 DIAGNOSIS — M62830 Muscle spasm of back: Secondary | ICD-10-CM | POA: Diagnosis not present

## 2024-02-28 DIAGNOSIS — M9903 Segmental and somatic dysfunction of lumbar region: Secondary | ICD-10-CM | POA: Diagnosis not present

## 2024-02-28 DIAGNOSIS — M9901 Segmental and somatic dysfunction of cervical region: Secondary | ICD-10-CM | POA: Diagnosis not present

## 2024-03-13 DIAGNOSIS — M9901 Segmental and somatic dysfunction of cervical region: Secondary | ICD-10-CM | POA: Diagnosis not present

## 2024-03-13 DIAGNOSIS — M62830 Muscle spasm of back: Secondary | ICD-10-CM | POA: Diagnosis not present

## 2024-03-13 DIAGNOSIS — M546 Pain in thoracic spine: Secondary | ICD-10-CM | POA: Diagnosis not present

## 2024-03-13 DIAGNOSIS — M542 Cervicalgia: Secondary | ICD-10-CM | POA: Diagnosis not present

## 2024-03-13 DIAGNOSIS — M9902 Segmental and somatic dysfunction of thoracic region: Secondary | ICD-10-CM | POA: Diagnosis not present

## 2024-03-13 DIAGNOSIS — M9903 Segmental and somatic dysfunction of lumbar region: Secondary | ICD-10-CM | POA: Diagnosis not present

## 2024-03-17 ENCOUNTER — Other Ambulatory Visit: Payer: Self-pay | Admitting: Primary Care

## 2024-03-17 ENCOUNTER — Other Ambulatory Visit: Payer: Self-pay

## 2024-03-17 DIAGNOSIS — K219 Gastro-esophageal reflux disease without esophagitis: Secondary | ICD-10-CM

## 2024-03-17 DIAGNOSIS — R3915 Urgency of urination: Secondary | ICD-10-CM

## 2024-03-17 MED ORDER — OMEPRAZOLE 20 MG PO CPDR: 20.0000 mg | DELAYED_RELEASE_CAPSULE | Freq: Every day | ORAL | 0 refills | Status: DC

## 2024-03-17 NOTE — Telephone Encounter (Signed)
 Called and spoke with patient, he states he was taking 2 10mg  tablets daily of the over the counter omeprazole for heartburn. He is requesting script so he can get 20mg  tab to take once daily.

## 2024-03-17 NOTE — Telephone Encounter (Signed)
 Please call patient:  We received a refill request of his omeprazole which I have not prescribed previously.  Is he taking 1 or 2 capsules daily?  What is he taking this for?

## 2024-03-17 NOTE — Telephone Encounter (Signed)
 Requested Prescriptions   Signed Prescriptions Disp Refills   omeprazole (PRILOSEC) 20 MG capsule 90 capsule 0    Sig: Take 1 capsule (20 mg total) by mouth daily. for heartburn.    Authorizing Provider: [place] Kosch K   Refill(s) sent to pharmacy.

## 2024-03-22 DIAGNOSIS — M9902 Segmental and somatic dysfunction of thoracic region: Secondary | ICD-10-CM | POA: Diagnosis not present

## 2024-03-22 DIAGNOSIS — M546 Pain in thoracic spine: Secondary | ICD-10-CM | POA: Diagnosis not present

## 2024-03-22 DIAGNOSIS — M9901 Segmental and somatic dysfunction of cervical region: Secondary | ICD-10-CM | POA: Diagnosis not present

## 2024-03-22 DIAGNOSIS — M542 Cervicalgia: Secondary | ICD-10-CM | POA: Diagnosis not present

## 2024-03-22 DIAGNOSIS — M9903 Segmental and somatic dysfunction of lumbar region: Secondary | ICD-10-CM | POA: Diagnosis not present

## 2024-03-22 DIAGNOSIS — M62830 Muscle spasm of back: Secondary | ICD-10-CM | POA: Diagnosis not present

## 2024-04-04 ENCOUNTER — Ambulatory Visit: Payer: Self-pay | Admitting: Primary Care

## 2024-04-04 ENCOUNTER — Ambulatory Visit (INDEPENDENT_AMBULATORY_CARE_PROVIDER_SITE_OTHER): Payer: Medicare HMO | Admitting: Primary Care

## 2024-04-04 VITALS — BP 136/78 | HR 70 | Temp 97.5°F | Ht 69.0 in | Wt 199.0 lb

## 2024-04-04 DIAGNOSIS — M546 Pain in thoracic spine: Secondary | ICD-10-CM | POA: Diagnosis not present

## 2024-04-04 DIAGNOSIS — E1169 Type 2 diabetes mellitus with other specified complication: Secondary | ICD-10-CM | POA: Diagnosis not present

## 2024-04-04 DIAGNOSIS — E785 Hyperlipidemia, unspecified: Secondary | ICD-10-CM | POA: Diagnosis not present

## 2024-04-04 DIAGNOSIS — E1165 Type 2 diabetes mellitus with hyperglycemia: Secondary | ICD-10-CM

## 2024-04-04 DIAGNOSIS — E1159 Type 2 diabetes mellitus with other circulatory complications: Secondary | ICD-10-CM | POA: Diagnosis not present

## 2024-04-04 DIAGNOSIS — R002 Palpitations: Secondary | ICD-10-CM | POA: Diagnosis not present

## 2024-04-04 DIAGNOSIS — I152 Hypertension secondary to endocrine disorders: Secondary | ICD-10-CM

## 2024-04-04 DIAGNOSIS — G8929 Other chronic pain: Secondary | ICD-10-CM

## 2024-04-04 DIAGNOSIS — Z0000 Encounter for general adult medical examination without abnormal findings: Secondary | ICD-10-CM | POA: Diagnosis not present

## 2024-04-04 DIAGNOSIS — R3915 Urgency of urination: Secondary | ICD-10-CM

## 2024-04-04 DIAGNOSIS — K219 Gastro-esophageal reflux disease without esophagitis: Secondary | ICD-10-CM | POA: Diagnosis not present

## 2024-04-04 DIAGNOSIS — J4540 Moderate persistent asthma, uncomplicated: Secondary | ICD-10-CM

## 2024-04-04 LAB — LIPID PANEL
Cholesterol: 144 mg/dL (ref 0–200)
HDL: 40.5 mg/dL (ref >39.00)
LDL Cholesterol: 50 mg/dL (ref 0–99)
NonHDL: 103.58
Total CHOL/HDL Ratio: 4
Triglycerides: 269 mg/dL — ABNORMAL HIGH (ref 0.0–149.0)
VLDL: 53.8 mg/dL — ABNORMAL HIGH (ref 0.0–40.0)

## 2024-04-04 LAB — COMPREHENSIVE METABOLIC PANEL WITH GFR
ALT: 12 U/L (ref 0–53)
AST: 15 U/L (ref 0–37)
Albumin: 4.1 g/dL (ref 3.5–5.2)
Alkaline Phosphatase: 68 U/L (ref 39–117)
BUN: 11 mg/dL (ref 6–23)
CO2: 26 meq/L (ref 19–32)
Calcium: 9.2 mg/dL (ref 8.4–10.5)
Chloride: 97 meq/L (ref 96–112)
Creatinine, Ser: 0.85 mg/dL (ref 0.40–1.50)
GFR: 83.37 mL/min (ref >60.00)
Glucose, Bld: 171 mg/dL — ABNORMAL HIGH (ref 70–99)
Potassium: 4.1 meq/L (ref 3.5–5.1)
Sodium: 133 meq/L — ABNORMAL LOW (ref 135–145)
Total Bilirubin: 0.4 mg/dL (ref 0.2–1.2)
Total Protein: 6.5 g/dL (ref 6.0–8.3)

## 2024-04-04 LAB — HEMOGLOBIN A1C: Hgb A1c MFr Bld: 8.2 % — ABNORMAL HIGH (ref 4.6–6.5)

## 2024-04-04 MED ORDER — FLUTICASONE-SALMETEROL 250-50 MCG/ACT IN AEPB: 1.0000 | INHALATION_SPRAY | Freq: Two times a day (BID) | RESPIRATORY_TRACT | 11 refills | Status: DC

## 2024-04-04 MED ORDER — SPIRIVA RESPIMAT 1.25 MCG/ACT IN AERS: 2.0000 | INHALATION_SPRAY | Freq: Every day | RESPIRATORY_TRACT | 11 refills | Status: AC

## 2024-04-04 NOTE — Assessment & Plan Note (Signed)
Controlled.  Continue metoprolol succinate 25 mg daily. 

## 2024-04-04 NOTE — Assessment & Plan Note (Signed)
 Ongoing.   Recommended he follow back up with sports medicine. Also recommended he increase physical activity.

## 2024-04-04 NOTE — Assessment & Plan Note (Signed)
 Repeat A1C pending.   Continue metformin  1000 mg BID.   Follow up in 3-6 months due to A1C results.

## 2024-04-04 NOTE — Assessment & Plan Note (Signed)
 Repeat lipid panel pending. Continue rosuvastatin 5 mg daily.

## 2024-04-04 NOTE — Patient Instructions (Signed)
 We increased your heartburn medication (omeprazole ) to 20 mg twice daily.  Please update me in a few weeks.  Stop by the lab prior to leaving today. I will notify you of your results once received.   Start Advair inhaler for asthma.  Inhale 2 puffs into the lungs twice daily.  Start Spiriva  inhaler for asthma.  Inhale 2 puffs into the lungs once daily.  Please schedule a follow up visit for 6 months for a diabetes check.  It was a pleasure to see you today!

## 2024-04-04 NOTE — Addendum Note (Signed)
 Addended by: Jaspreet Hollings K on: 04/04/2024 09:21 AM   Modules accepted: Orders

## 2024-04-04 NOTE — Assessment & Plan Note (Addendum)
 Uncontrolled with inappropriate use of SABA. Following with pulmonology, reviewed office visit from January 2025.  As Brian Mullen has become cost prohibitive, coupled with prior reactions with Trelegy, will resume Advair and Spiriva  as he did well on these previously.   Continue montelukast  10 mg HS. He will update.

## 2024-04-04 NOTE — Assessment & Plan Note (Signed)
 Uncontrolled.  Increase omeprazole  20 mg twice daily. He will update.

## 2024-04-04 NOTE — Assessment & Plan Note (Addendum)
 Immunizations UTD. Colonoscopy N/A given age Discontinue PSA given age.  Discussed the importance of a healthy diet and regular exercise in order for weight loss, and to reduce the risk of further co-morbidity.  Exam stable. Labs pending.  Follow up in 1 year for repeat physical.

## 2024-04-04 NOTE — Assessment & Plan Note (Signed)
Improved.  Continue tamsulosin 0.8 mg daily.

## 2024-04-04 NOTE — Assessment & Plan Note (Signed)
 Stable.  No concerns today.  Continue tizanidine  4 mg PRN.

## 2024-04-04 NOTE — Progress Notes (Addendum)
 Subjective:    Patient ID: Brian Mullen, male    DOB: [birth year], 78 y.o.   MRN: [id number]  HPI  Brian Mullen is a very pleasant 78 y.o. male who presents today for complete physical and follow up of chronic conditions.   Immunizations: -Tetanus: Completed in 2014 -Shingles: Completed Shingrix series and Zostavax -Pneumonia: Completed Prevnar 13 in 2014, Pneumovax 23 in 2020  Diet: Fair diet.  Exercise: No regular exercise.  Eye exam: Completes annually  Dental exam: Completes semi-annually    Colonoscopy: Completed Cologuard in 2019   PSA: Due  BP Readings from Last 3 Encounters:  04/04/24 136/78  12/09/23 (!) 124/58  09/21/23 124/68         Review of Systems  Constitutional:  Negative for unexpected weight change.  HENT:  Negative for rhinorrhea.   Respiratory:  Positive for shortness of breath. Negative for cough.   Cardiovascular:  Negative for chest pain.  Gastrointestinal:  Negative for constipation and diarrhea.       Increased gas, belching, esophageal burning  Genitourinary:  Negative for difficulty urinating.  Musculoskeletal:  Negative for arthralgias and myalgias.  Skin:  Negative for rash.  Allergic/Immunologic: Negative for environmental allergies.  Neurological:  Negative for dizziness, numbness and headaches.  Psychiatric/Behavioral:  The patient is not nervous/anxious.          Past Medical History:  Diagnosis Date   Allergy    Asthma    Cellulitis of left upper extremity 06/27/2021   Diabetes mellitus without complication (HCC)    GERD (gastroesophageal reflux disease)    Hypertension    Palpitations    a. 10/2019: Monitor showing rare PVC's and no significant arrhythmias.    Persistent cough for 3 weeks or longer 05/20/2021    Social History   Socioeconomic History   Marital status: Widowed    Spouse name: Not on file   Number of children: Not on file   Years of education: Not on file   Highest education level: GED or  equivalent  Occupational History   Not on file  Tobacco Use   Smoking status: Former    Current packs/day: 0.00    Average packs/day: 4.0 packs/day for 12.0 years (48.0 ttl pk-yrs)    Types: Cigarettes    Start date: 71    Quit date: 1    Years since quitting: 49.4   Smokeless tobacco: Never   Tobacco comments:    used to smoke Lucky's  Vaping Use   Vaping status: Never Used  Substance and Sexual Activity   Alcohol use: Not Currently    Alcohol/week: 1.0 - 2.0 standard drink of alcohol    Types: 1 - 2 Standard drinks or equivalent per week    Comment: occ   Drug use: No   Sexual activity: Not on file  Other Topics Concern   Not on file  Social History Narrative   Married.   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   Enjoys working on [employer], yard work, gardening.   Right handed    Social Drivers of Health   Financial Resource Strain: Low Risk  (04/04/2024)   Overall Financial Resource Strain (CARDIA)    Difficulty of Paying Living Expenses: Not hard at all  Food Insecurity: No Food Insecurity (04/04/2024)   Hunger Vital Sign    Worried About Running Out of Food in the Last Year: Never true    Ran Out of Food in the Last  Year: Never true  Transportation Needs: No Transportation Needs (04/04/2024)   PRAPARE - Administrator, Civil Service (Medical): No    Lack of Transportation (Non-Medical): No  Physical Activity: Inactive (04/04/2024)   Exercise Vital Sign    Days of Exercise per Week: 0 days    Minutes of Exercise per Session: 0 min  Stress: No Stress Concern Present (04/04/2024)   [employer] of Occupational Health - Occupational Stress Questionnaire    Feeling of Stress : Not at all  Social Connections: Socially Isolated (04/04/2024)   Social Connection and Isolation Panel [NHANES]    Frequency of Communication with Friends and Family: Twice a week    Frequency of Social Gatherings with Friends and Family: Once a week     Attends Religious Services: Never    Database administrator or Organizations: No    Attends Banker Meetings: Never    Marital Status: Widowed  Intimate Partner Violence: Not At Risk (01/14/2024)   Humiliation, Afraid, Rape, and Kick questionnaire    Fear of Current or Ex-Partner: No    Emotionally Abused: No    Physically Abused: No    Sexually Abused: No    Past Surgical History:  Procedure Laterality Date   CATARACT EXTRACTION Bilateral    02/23/2022, 03/28/2022   ELBOW SURGERY     KNEE SURGERY Right    TONSILLECTOMY      Family History  Problem Relation Age of Onset   Pulmonary embolism Father 51       Deceased   Heart attack Mother 11       Deceased   Diabetes Maternal Grandmother    Heart attack Sister     Allergies  Allergen Reactions   Coconut (Cocos Nucifera) Anaphylaxis   Coconut Oil Anaphylaxis   Iodine Anaphylaxis   Shellfish Allergy Anaphylaxis   Shellfish-Derived Products Anaphylaxis   Amlodipine  Cough    LE Swelling   Trelegy Ellipta  [Fluticasone -Umeclidin-Vilant] Other (See Comments)    Dizzy and vision issues-tried twice    Current Outpatient Medications on File Prior to Visit  Medication Sig Dispense Refill   albuterol  (VENTOLIN  HFA) 108 (90 Base) MCG/ACT inhaler INHALE 1 TO 2 PUFFS EVERY 4 TO 6 HOURS AS NEEDED 1 each 0   Blood Glucose Monitoring Suppl DEVI 1 each by Does not apply route in the morning, at noon, and at bedtime. May substitute to any manufacturer covered by patient's insurance. 1 each 0   fexofenadine  (ALLEGRA ) 180 MG tablet Take 1 tablet (180 mg total) by mouth daily. For allergies 90 tablet 1   fluticasone  (FLONASE ) 50 MCG/ACT nasal spray PLACE 1 SPRAY INTO BOTH NOSTRILS 2   TIMES DAILY. 48 g 1   Glucose Blood (BLOOD GLUCOSE TEST STRIPS) STRP 1 each by In Vitro route in the morning, at noon, and at bedtime. May substitute to any manufacturer covered by patient's insurance. 300 strip 1   Lancets Misc. MISC 1 each by  Does not apply route in the morning, at noon, and at bedtime. May substitute to any manufacturer covered by patient's insurance. 300 each 1   latanoprost (XALATAN) 0.005 % ophthalmic solution SMARTSIG:1 Drop(s) In Eye(s) Every Evening     metFORMIN  (GLUCOPHAGE ) 500 MG tablet TAKE 2 TABLETS TWICE A DAY WITH MEALS FOR DIABETES 360 tablet 1   metoprolol  succinate (TOPROL -XL) 25 MG 24 hr tablet Take 1 tablet (25 mg total) by mouth daily. For blood pressure 90 tablet 0   montelukast  (SINGULAIR )  10 MG tablet TAKE 1 TABLET EVERY DAY AT BEDTIME FOR ALLERGIES, ASTHMA 90 tablet 0   omeprazole  (PRILOSEC) 20 MG capsule Take 1 capsule (20 mg total) by mouth daily. for heartburn. 90 capsule 0   rosuvastatin  (CRESTOR ) 5 MG tablet Take 1 tablet (5 mg total) by mouth daily. for cholesterol. 90 tablet 2   tamsulosin  (FLOMAX ) 0.4 MG CAPS capsule TAKE 2 CAPSULES EVERY DAY FOR URINE FLOW 180 capsule 0   tiZANidine  (ZANAFLEX ) 4 MG tablet TAKE 1 TABLET AT BEDTIME AS NEEDED FOR MUSCLE SPASM(S) 30 tablet 0   No current facility-administered medications on file prior to visit.    BP 136/78   Pulse 70   Temp (!) 97.5 F (36.4 C) (Temporal)   Ht 5\' 9"  (1.753 m)   Wt 199 lb (90.3 kg)   SpO2 97%   BMI 29.39 kg/m  Objective:   Physical Exam        Assessment & Plan:  Preventative health care Assessment & Plan: Immunizations UTD. Colonoscopy N/A given age Discontinue PSA given age.  Discussed the importance of a healthy diet and regular exercise in order for weight loss, and to reduce the risk of further co-morbidity.  Exam stable. Labs pending.  Follow up in 1 year for repeat physical.    Moderate persistent asthma without complication Assessment & Plan: Uncontrolled with inappropriate use of SABA. Following with pulmonology, reviewed office visit from January 2025.  As Brian Mullen has become cost prohibitive, coupled with prior reactions with Trelegy, will resume Advair and Spiriva  as he did well on  these previously.   Continue montelukast  10 mg HS. He will update.  Orders: -     Fluticasone -Salmeterol; Inhale 1 puff into the lungs in the morning and at bedtime.  Dispense: 60 each; Refill: 11 -     Spiriva  Respimat; Inhale 2 puffs into the lungs daily.  Dispense: 4 g; Refill: 11  Hyperlipidemia associated with type 2 diabetes mellitus (HCC) Assessment & Plan: Repeat lipid panel pending. Continue rosuvastatin  5 mg daily.   Orders: -     Lipid panel  Type 2 diabetes mellitus with hyperglycemia, without long-term current use of insulin (HCC) Assessment & Plan: Repeat A1C pending.   Continue metformin  1000 mg BID.   Follow up in 3-6 months due to A1C results.    Orders: -     Hemoglobin A1c -     Comprehensive metabolic panel with GFR  Chronic right-sided thoracic back pain Assessment & Plan: Stable.  No concerns today.  Continue tizanidine  4 mg PRN.   Chronic pain of both knees Assessment & Plan: Ongoing.   Recommended he follow back up with sports medicine. Also recommended he increase physical activity.    Urinary urgency Assessment & Plan: Improved.  Continue tamsulosin  0.8 mg daily.   Gastroesophageal reflux disease, unspecified whether esophagitis present Assessment & Plan: Uncontrolled.  Increase omeprazole  20 mg twice daily. He will update.   Hypertension associated with diabetes [facility]) Assessment & Plan: Controlled.  Continue metoprolol  succinate 25 mg daily.   Palpitations Assessment & Plan: Controlled.  Continue metoprolol  succinate 25 mg daily.         Brian Mullen K Brian Brine, NP

## 2024-04-05 DIAGNOSIS — M62830 Muscle spasm of back: Secondary | ICD-10-CM | POA: Diagnosis not present

## 2024-04-05 DIAGNOSIS — M9903 Segmental and somatic dysfunction of lumbar region: Secondary | ICD-10-CM | POA: Diagnosis not present

## 2024-04-05 DIAGNOSIS — M9902 Segmental and somatic dysfunction of thoracic region: Secondary | ICD-10-CM | POA: Diagnosis not present

## 2024-04-05 DIAGNOSIS — M546 Pain in thoracic spine: Secondary | ICD-10-CM | POA: Diagnosis not present

## 2024-04-05 DIAGNOSIS — M542 Cervicalgia: Secondary | ICD-10-CM | POA: Diagnosis not present

## 2024-04-05 DIAGNOSIS — M9901 Segmental and somatic dysfunction of cervical region: Secondary | ICD-10-CM | POA: Diagnosis not present

## 2024-04-11 MED ORDER — OZEMPIC (0.25 OR 0.5 MG/DOSE) 2 MG/3ML ~~LOC~~ SOPN: PEN_INJECTOR | SUBCUTANEOUS | 0 refills | Status: DC

## 2024-04-12 ENCOUNTER — Other Ambulatory Visit: Payer: Self-pay | Admitting: Primary Care

## 2024-04-12 DIAGNOSIS — E119 Type 2 diabetes mellitus without complications: Secondary | ICD-10-CM

## 2024-04-12 DIAGNOSIS — G8929 Other chronic pain: Secondary | ICD-10-CM

## 2024-04-12 DIAGNOSIS — M542 Cervicalgia: Secondary | ICD-10-CM | POA: Diagnosis not present

## 2024-04-12 DIAGNOSIS — M62830 Muscle spasm of back: Secondary | ICD-10-CM | POA: Diagnosis not present

## 2024-04-12 DIAGNOSIS — M9901 Segmental and somatic dysfunction of cervical region: Secondary | ICD-10-CM | POA: Diagnosis not present

## 2024-04-12 DIAGNOSIS — M546 Pain in thoracic spine: Secondary | ICD-10-CM | POA: Diagnosis not present

## 2024-04-12 DIAGNOSIS — R252 Cramp and spasm: Secondary | ICD-10-CM

## 2024-04-12 DIAGNOSIS — M9903 Segmental and somatic dysfunction of lumbar region: Secondary | ICD-10-CM | POA: Diagnosis not present

## 2024-04-12 DIAGNOSIS — M9902 Segmental and somatic dysfunction of thoracic region: Secondary | ICD-10-CM | POA: Diagnosis not present

## 2024-04-12 NOTE — Telephone Encounter (Signed)
 Patient needs diabetes follow up in late August. Was there any trouble getting the Ozempic from his pharmacy?

## 2024-04-12 NOTE — Telephone Encounter (Signed)
 Called and spoke with patient, he picked up the Ozempic today. He will need to call back and schedule appt when he has a calendar available.

## 2024-04-14 ENCOUNTER — Other Ambulatory Visit: Payer: Self-pay | Admitting: Primary Care

## 2024-04-14 DIAGNOSIS — I10 Essential (primary) hypertension: Secondary | ICD-10-CM

## 2024-04-14 NOTE — Telephone Encounter (Signed)
 Noted. Will await ED notes.

## 2024-04-14 NOTE — Telephone Encounter (Signed)
 I spoke with pt and Haskell Linker (DPR signed) said during the night last night pt felt heart racing, pressure in chest like someone sitting on chest,difficulty breathing,lightheadedness with feeling unstable when walking (symptoms lasted few hrs)and then diarrhea started later in the morning. Pt started ozempic on 04/13/24. Pt said took allegra  earlier this morning and now only symptom pt has is diarrhea. At 10:45 AM pts  BP 147/73 P 71 oxy sat 97% and FBS this morning 120. I spoke with Tresea Frost NP and she said with those symptoms pt needs to be evaluated. Pt said he is going to eat breakfast and then will go to [facility] ED. ED precautions given and pt and Haskell Linker voiced understanding. Sending note to Tresea Frost NP.

## 2024-04-14 NOTE — Telephone Encounter (Signed)
Please call and triage patient. Thanks.

## 2024-04-15 ENCOUNTER — Emergency Department (HOSPITAL_COMMUNITY)
Admission: EM | Admit: 2024-04-15 | Discharge: 2024-04-15 | Disposition: A | Discharge status: Home / Self Care | Attending: Emergency Medicine | Admitting: Emergency Medicine

## 2024-04-15 ENCOUNTER — Encounter (HOSPITAL_COMMUNITY): Payer: Self-pay

## 2024-04-15 ENCOUNTER — Other Ambulatory Visit: Payer: Self-pay

## 2024-04-15 ENCOUNTER — Emergency Department (HOSPITAL_COMMUNITY)

## 2024-04-15 DIAGNOSIS — I10 Essential (primary) hypertension: Secondary | ICD-10-CM | POA: Insufficient documentation

## 2024-04-15 DIAGNOSIS — T50905A Adverse effect of unspecified drugs, medicaments and biological substances, initial encounter: Secondary | ICD-10-CM | POA: Diagnosis not present

## 2024-04-15 DIAGNOSIS — R0600 Dyspnea, unspecified: Secondary | ICD-10-CM

## 2024-04-15 DIAGNOSIS — Z79899 Other long term (current) drug therapy: Secondary | ICD-10-CM | POA: Diagnosis not present

## 2024-04-15 DIAGNOSIS — E119 Type 2 diabetes mellitus without complications: Secondary | ICD-10-CM | POA: Diagnosis not present

## 2024-04-15 DIAGNOSIS — Z7984 Long term (current) use of oral hypoglycemic drugs: Secondary | ICD-10-CM | POA: Insufficient documentation

## 2024-04-15 DIAGNOSIS — R0602 Shortness of breath: Secondary | ICD-10-CM | POA: Diagnosis not present

## 2024-04-15 DIAGNOSIS — R002 Palpitations: Secondary | ICD-10-CM | POA: Diagnosis not present

## 2024-04-15 LAB — CBC WITH DIFFERENTIAL/PLATELET
Abs Immature Granulocytes: 0.03 10*3/uL (ref 0.00–0.07)
Basophils Absolute: 0.1 10*3/uL (ref 0.0–0.1)
Basophils Relative: 1 %
Eosinophils Absolute: 0.4 10*3/uL (ref 0.0–0.5)
Eosinophils Relative: 4 %
HCT: 38 % — ABNORMAL LOW (ref 39.0–52.0)
Hemoglobin: 12.4 g/dL — ABNORMAL LOW (ref 13.0–17.0)
Immature Granulocytes: 0 %
Lymphocytes Relative: 28 %
Lymphs Abs: 2.8 10*3/uL (ref 0.7–4.0)
MCH: 29.2 pg (ref 26.0–34.0)
MCHC: 32.6 g/dL (ref 30.0–36.0)
MCV: 89.4 fL (ref 80.0–100.0)
Monocytes Absolute: 0.8 10*3/uL (ref 0.1–1.0)
Monocytes Relative: 8 %
Neutro Abs: 6.1 10*3/uL (ref 1.7–7.7)
Neutrophils Relative %: 59 %
Platelets: 294 10*3/uL (ref 150–400)
RBC: 4.25 MIL/uL (ref 4.22–5.81)
RDW: 13.2 % (ref 11.5–15.5)
WBC: 10.2 10*3/uL (ref 4.0–10.5)
nRBC: 0 % (ref 0.0–0.2)

## 2024-04-15 LAB — COMPREHENSIVE METABOLIC PANEL WITH GFR
ALT: 11 U/L (ref 0–44)
AST: 19 U/L (ref 15–41)
Albumin: 3.6 g/dL (ref 3.5–5.0)
Alkaline Phosphatase: 44 U/L (ref 38–126)
Anion gap: 10 (ref 5–15)
BUN: 11 mg/dL (ref 8–23)
CO2: 21 mmol/L — ABNORMAL LOW (ref 22–32)
Calcium: 8.3 mg/dL — ABNORMAL LOW (ref 8.9–10.3)
Chloride: 103 mmol/L (ref 98–111)
Creatinine, Ser: 0.89 mg/dL (ref 0.61–1.24)
GFR, Estimated: >60 mL/min (ref >60)
Glucose, Bld: 148 mg/dL — ABNORMAL HIGH (ref 70–99)
Potassium: 4.4 mmol/L (ref 3.5–5.1)
Sodium: 134 mmol/L — ABNORMAL LOW (ref 135–145)
Total Bilirubin: 0.7 mg/dL (ref 0.0–1.2)
Total Protein: 6.6 g/dL (ref 6.5–8.1)

## 2024-04-15 LAB — TROPONIN I (HIGH SENSITIVITY): Troponin I (High Sensitivity): 4 ng/L (ref <18)

## 2024-04-15 LAB — LIPASE, BLOOD: Lipase: 30 U/L (ref 11–51)

## 2024-04-15 NOTE — ED Provider Notes (Signed)
 [place] EMERGENCY DEPARTMENT AT [facility] Provider Note   CSN: [id number] Arrival date & time: 04/15/24  0049     History  Chief Complaint  Patient presents with   Shortness of Breath    Brian Mullen is a 77 y.o. male.  Patient is a 78 year old male with past medical history of hypertension, type 2 diabetes, hyperlipidemia.  Patient presenting today for evaluation of shortness of breath and palpitations.  He was started 2 evenings ago on Ozempic  for his diabetes.  The following morning, he woke up with a pressure in his chest and feeling short of breath.  He also felt as though his heart was pounding, but his pulse was not elevated.  He spoke with his primary doctor who advised him to come to the ER to be checked out.  He did take a dose of Benadryl, then symptoms seem to go away.  This evening, the symptoms returned and presents for evaluation of them.       Home Medications Prior to Admission medications   Medication Sig Start Date End Date Taking? Authorizing Provider  albuterol  (VENTOLIN  HFA) 108 (90 Base) MCG/ACT inhaler INHALE 1 TO 2 PUFFS EVERY 4 TO 6 HOURS AS NEEDED 10/20/23   Clark, Katherine K, NP  Blood Glucose Monitoring Suppl DEVI 1 each by Does not apply route in the morning, at noon, and at bedtime. May substitute to any manufacturer covered by patient's insurance. 06/17/23   Gabriel John, NP  fexofenadine  (ALLEGRA ) 180 MG tablet Take 1 tablet (180 mg total) by mouth daily. For allergies 12/18/22   Clark, Katherine K, NP  fluticasone  (FLONASE ) 50 MCG/ACT nasal spray PLACE 1 SPRAY INTO BOTH NOSTRILS 2   TIMES DAILY. 08/13/22   Clark, Katherine K, NP  fluticasone -salmeterol (ADVAIR DISKUS) 250-50 MCG/ACT AEPB Inhale 1 puff into the lungs in the morning and at bedtime. 04/04/24   Clark, Katherine K, NP  Glucose Blood (BLOOD GLUCOSE TEST STRIPS) STRP 1 each by In Vitro route in the morning, at noon, and at bedtime. May substitute to any manufacturer covered by  patient's insurance. 06/17/23   Gabriel John, NP  Lancets Misc. MISC 1 each by Does not apply route in the morning, at noon, and at bedtime. May substitute to any manufacturer covered by patient's insurance. 06/17/23   Clark, Katherine K, NP  latanoprost (XALATAN) 0.005 % ophthalmic solution SMARTSIG:1 Drop(s) In Eye(s) Every Evening 11/21/21   [provider]  metFORMIN  (GLUCOPHAGE ) 500 MG tablet TAKE 2 TABLETS TWICE DAILY WITH MEALS FOR DIABETES 04/12/24   Clark, Katherine K, NP  metoprolol  succinate (TOPROL -XL) 25 MG 24 hr tablet TAKE 1 TABLET EVERY DAY FOR BLOOD PRESSURE 04/14/24   Clark, Katherine K, NP  montelukast  (SINGULAIR ) 10 MG tablet TAKE 1 TABLET EVERY DAY AT BEDTIME FOR ALLERGIES, ASTHMA 02/08/24   Clark, Katherine K, NP  omeprazole  (PRILOSEC) 20 MG capsule Take 1 capsule (20 mg total) by mouth daily. for heartburn. 03/17/24   Clark, Katherine K, NP  rosuvastatin  (CRESTOR ) 5 MG tablet Take 1 tablet (5 mg total) by mouth daily. for cholesterol. 05/31/23   Clark, Katherine K, NP  Semaglutide ,0.25 or 0.5MG /DOS, (OZEMPIC , 0.25 OR 0.5 MG/DOSE,) 2 MG/3ML SOPN Inject 0.25 mg into the skin once weekly for 4 weeks, then increase to 0.5 mg once weekly thereafter for diabetes. 04/11/24   Clark, Katherine K, NP  tamsulosin  (FLOMAX ) 0.4 MG CAPS capsule TAKE 2 CAPSULES EVERY DAY FOR URINE FLOW 03/17/24   Tretha Fu  K, NP  Tiotropium Bromide  Monohydrate (SPIRIVA  RESPIMAT) 1.25 MCG/ACT AERS Inhale 2 puffs into the lungs daily. 04/04/24   Clark, Katherine K, NP  tiZANidine  (ZANAFLEX ) 4 MG tablet TAKE 1 TABLET AT BEDTIME AS NEEDED FOR MUSCLE SPASM(S) 04/12/24   Clark, Katherine K, NP      Allergies    Coconut (cocos nucifera), Coconut oil, Iodine, Shellfish allergy, Shellfish-derived products, Amlodipine , and Trelegy ellipta  [fluticasone -umeclidin-vilant]    Review of Systems   Review of Systems  All other systems reviewed and are negative.   Physical Exam Updated Vital Signs BP (!) 148/70  (BP Location: Left Arm)   Pulse 70   Temp (!) 97.3 F (36.3 C) (Oral)   Resp 11   Ht 5\' 9"  (1.753 m)   Wt 89.4 kg   SpO2 98%   BMI 29.09 kg/m  Physical Exam Vitals and nursing note reviewed.  Constitutional:      General: He is not in acute distress.    Appearance: He is well-developed. He is not diaphoretic.  HENT:     Head: Normocephalic and atraumatic.  Cardiovascular:     Rate and Rhythm: Normal rate and regular rhythm.     Heart sounds: No murmur heard.    No friction rub.  Pulmonary:     Effort: Pulmonary effort is normal. No respiratory distress.     Breath sounds: Normal breath sounds. No wheezing or rales.  Abdominal:     General: Bowel sounds are normal. There is no distension.     Palpations: Abdomen is soft.     Tenderness: There is no abdominal tenderness.  Musculoskeletal:        General: Normal range of motion.     Cervical back: Normal range of motion and neck supple.  Skin:    General: Skin is warm and dry.  Neurological:     Mental Status: He is alert and oriented to person, place, and time.     Coordination: Coordination normal.     ED Results / Procedures / Treatments   Labs (all labs ordered are listed, but only abnormal results are displayed) Labs Reviewed - No data to display  EKG EKG Interpretation Date/Time:  Saturday Apr 15 2024 01:02:42 EDT Ventricular Rate:  72 PR Interval:  147 QRS Duration:  108 QT Interval:  394 QTC Calculation: 432 R Axis:   72  Text Interpretation: Sinus rhythm Normal ECG Confirmed by Orvilla Blander ([id number]) on 04/15/2024 1:05:59 AM  Radiology No results found.  Procedures Procedures    Medications Ordered in ED Medications - No data to display  ED Course/ Medical Decision Making/ A&P  Patient is a 78 year old male presenting with palpitations and shortness of breath after starting Ozempic .  Patient arrives with stable vital signs and is afebrile.  He is in a sinus rhythm with heart rate in the 60s.   Physical examination unremarkable.  Abdomen benign.  Laboratory studies obtained including CBC, CMP, lipase, and troponin.  All studies are basically unremarkable.  Chest x-ray showing no acute process.  Patient has been observed for 2 hours and is now feeling much improved.  I suspect his symptoms are a side effect of the Ozempic .  Nothing suggests a cardiac etiology.  He had a negative troponin despite symptoms intermittently for the past 12+ hours.  Patient will be discharged and is to return as needed if his symptoms worsen or change.  Final Clinical Impression(s) / ED Diagnoses Final diagnoses:  None    Rx / DC  Orders ED Discharge Orders     None         Orvilla Blander, MD 04/15/24 [phone]

## 2024-04-15 NOTE — ED Triage Notes (Signed)
 Pt with SOB that started Thursday night. Woke up feeling like his heart was going to pound out of his chest and a pressure starting on his left lower abdomen to his upper left chest. Took a benadryl at 0500 yesterday which helped. Symptoms returned last night.

## 2024-04-15 NOTE — Discharge Instructions (Signed)
 Return to the emergency department if your symptoms significantly worsen or change.  Continue medications as previously prescribed.

## 2024-04-19 DIAGNOSIS — M9901 Segmental and somatic dysfunction of cervical region: Secondary | ICD-10-CM | POA: Diagnosis not present

## 2024-04-19 DIAGNOSIS — M9902 Segmental and somatic dysfunction of thoracic region: Secondary | ICD-10-CM | POA: Diagnosis not present

## 2024-04-19 DIAGNOSIS — M542 Cervicalgia: Secondary | ICD-10-CM | POA: Diagnosis not present

## 2024-04-19 DIAGNOSIS — M9903 Segmental and somatic dysfunction of lumbar region: Secondary | ICD-10-CM | POA: Diagnosis not present

## 2024-04-19 DIAGNOSIS — M62830 Muscle spasm of back: Secondary | ICD-10-CM | POA: Diagnosis not present

## 2024-04-19 DIAGNOSIS — M546 Pain in thoracic spine: Secondary | ICD-10-CM | POA: Diagnosis not present

## 2024-04-21 ENCOUNTER — Other Ambulatory Visit: Payer: Self-pay | Admitting: Primary Care

## 2024-04-21 DIAGNOSIS — J4540 Moderate persistent asthma, uncomplicated: Secondary | ICD-10-CM

## 2024-05-03 DIAGNOSIS — M9901 Segmental and somatic dysfunction of cervical region: Secondary | ICD-10-CM | POA: Diagnosis not present

## 2024-05-03 DIAGNOSIS — M9903 Segmental and somatic dysfunction of lumbar region: Secondary | ICD-10-CM | POA: Diagnosis not present

## 2024-05-03 DIAGNOSIS — M546 Pain in thoracic spine: Secondary | ICD-10-CM | POA: Diagnosis not present

## 2024-05-03 DIAGNOSIS — M542 Cervicalgia: Secondary | ICD-10-CM | POA: Diagnosis not present

## 2024-05-03 DIAGNOSIS — M62830 Muscle spasm of back: Secondary | ICD-10-CM | POA: Diagnosis not present

## 2024-05-03 DIAGNOSIS — M9902 Segmental and somatic dysfunction of thoracic region: Secondary | ICD-10-CM | POA: Diagnosis not present

## 2024-05-12 ENCOUNTER — Telehealth: Payer: Self-pay | Admitting: Primary Care

## 2024-05-12 ENCOUNTER — Other Ambulatory Visit: Payer: Self-pay | Admitting: Primary Care

## 2024-05-12 DIAGNOSIS — J4540 Moderate persistent asthma, uncomplicated: Secondary | ICD-10-CM

## 2024-05-12 DIAGNOSIS — E1169 Type 2 diabetes mellitus with other specified complication: Secondary | ICD-10-CM

## 2024-05-12 MED ORDER — ROSUVASTATIN CALCIUM 5 MG PO TABS: 5.0000 mg | ORAL_TABLET | Freq: Every day | ORAL | 2 refills | Status: DC

## 2024-05-12 NOTE — Telephone Encounter (Unsigned)
 Copied from CRM [id number]. Topic: Clinical - Medication Refill >> May 12, 2024 11:37 AM Thersia C wrote: Medication: rosuvastatin  (CRESTOR ) 5 MG tablet   Has the patient contacted their pharmacy? Yes (Agent: If no, request that the patient contact the pharmacy for the refill. If patient does not wish to contact the pharmacy document the reason why and proceed with request.) (Agent: If yes, when and what did the pharmacy advise?)  This is the patient's preferred pharmacy:    [facility] [address] [place] - [address] [place] [address] Phone: [phone] Fax: [phone]  Is this the correct pharmacy for this prescription? Yes If no, delete pharmacy and type the correct one.   Has the prescription been filled recently? No  Is the patient out of the medication? Yes  Has the patient been seen for an appointment in the last year OR does the patient have an upcoming appointment? Yes  Can we respond through MyChart? Yes  Agent: Please be advised that Rx refills may take up to 3 business days. We ask that you follow-up with your pharmacy.

## 2024-05-17 DIAGNOSIS — M542 Cervicalgia: Secondary | ICD-10-CM | POA: Diagnosis not present

## 2024-05-17 DIAGNOSIS — M9901 Segmental and somatic dysfunction of cervical region: Secondary | ICD-10-CM | POA: Diagnosis not present

## 2024-05-17 DIAGNOSIS — M9902 Segmental and somatic dysfunction of thoracic region: Secondary | ICD-10-CM | POA: Diagnosis not present

## 2024-05-17 DIAGNOSIS — M546 Pain in thoracic spine: Secondary | ICD-10-CM | POA: Diagnosis not present

## 2024-05-17 DIAGNOSIS — M62830 Muscle spasm of back: Secondary | ICD-10-CM | POA: Diagnosis not present

## 2024-05-17 DIAGNOSIS — M9903 Segmental and somatic dysfunction of lumbar region: Secondary | ICD-10-CM | POA: Diagnosis not present

## 2024-05-22 ENCOUNTER — Ambulatory Visit (INDEPENDENT_AMBULATORY_CARE_PROVIDER_SITE_OTHER): Admitting: Internal Medicine

## 2024-05-22 VITALS — BP 120/86 | HR 75 | Temp 98.1°F | Ht 69.0 in | Wt 195.0 lb

## 2024-05-22 DIAGNOSIS — L97521 Non-pressure chronic ulcer of other part of left foot limited to breakdown of skin: Secondary | ICD-10-CM | POA: Insufficient documentation

## 2024-05-22 MED ORDER — DOXYCYCLINE HYCLATE 100 MG PO TABS
100.0000 mg | ORAL_TABLET | Freq: Two times a day (BID) | ORAL | 0 refills | Status: DC
Start: 2024-05-22 — End: 2024-07-27

## 2024-05-22 NOTE — Progress Notes (Signed)
 Subjective:    Patient ID: Brian Mullen, male    DOB: 1946-02-02, 78 y.o.   MRN: [id number]  HPI Here due to a blister and possible infection on his toe With wife  Got blister on proximal left 2nd toe 5 days ago Feet swelled while at the track Then next day--couldn't put a shoe on (shows picture of large yellowish blister) It broke 2 days ago  Tried cortisone 10 and a bandaid Slightly uncomfortable but not really painful Did have red streak heading into foot yesterday  Current Outpatient Medications on File Prior to Visit  Medication Sig Dispense Refill   albuterol  (VENTOLIN  HFA) 108 (90 Base) MCG/ACT inhaler INHALE 1 TO 2 PUFFS EVERY 4 TO 6 HOURS AS NEEDED 1 each 0   Blood Glucose Monitoring Suppl DEVI 1 each by Does not apply route in the morning, at noon, and at bedtime. May substitute to any manufacturer covered by patient's insurance. 1 each 0   fexofenadine  (ALLEGRA ) 180 MG tablet Take 1 tablet (180 mg total) by mouth daily. For allergies 90 tablet 1   fluticasone  (FLONASE ) 50 MCG/ACT nasal spray PLACE 1 SPRAY INTO BOTH NOSTRILS 2   TIMES DAILY. 48 g 1   Glucose Blood (BLOOD GLUCOSE TEST STRIPS) STRP 1 each by In Vitro route in the morning, at noon, and at bedtime. May substitute to any manufacturer covered by patient's insurance. 300 strip 1   Lancets Misc. MISC 1 each by Does not apply route in the morning, at noon, and at bedtime. May substitute to any manufacturer covered by patient's insurance. 300 each 1   latanoprost (XALATAN) 0.005 % ophthalmic solution SMARTSIG:1 Drop(s) In Eye(s) Every Evening     metFORMIN  (GLUCOPHAGE ) 500 MG tablet TAKE 2 TABLETS TWICE DAILY WITH MEALS FOR DIABETES 360 tablet 1   metoprolol  succinate (TOPROL -XL) 25 MG 24 hr tablet TAKE 1 TABLET EVERY DAY FOR BLOOD PRESSURE 90 tablet 3   montelukast  (SINGULAIR ) 10 MG tablet TAKE 1 TABLET EVERY DAY AT BEDTIME FOR ALLERGIES, ASTHMA 90 tablet 2   omeprazole  (PRILOSEC) 20 MG capsule Take 1 capsule (20 mg  total) by mouth daily. for heartburn. 90 capsule 0   rosuvastatin  (CRESTOR ) 5 MG tablet Take 1 tablet (5 mg total) by mouth daily. for cholesterol. 90 tablet 2   Semaglutide ,0.25 or 0.5MG /DOS, (OZEMPIC , 0.25 OR 0.5 MG/DOSE,) 2 MG/3ML SOPN Inject 0.25 mg into the skin once weekly for 4 weeks, then increase to 0.5 mg once weekly thereafter for diabetes. 9 mL 0   tamsulosin  (FLOMAX ) 0.4 MG CAPS capsule TAKE 2 CAPSULES EVERY DAY FOR URINE FLOW 180 capsule 0   tiZANidine  (ZANAFLEX ) 4 MG tablet TAKE 1 TABLET AT BEDTIME AS NEEDED FOR MUSCLE SPASM(S) 30 tablet 0   Tiotropium Bromide  Monohydrate (SPIRIVA  RESPIMAT) 1.25 MCG/ACT AERS Inhale 2 puffs into the lungs daily. (Patient not taking: Reported on 05/22/2024) 4 g 11   No current facility-administered medications on file prior to visit.    Allergies  Allergen Reactions   Coconut (Cocos Nucifera) Anaphylaxis   Coconut Oil Anaphylaxis   Iodine Anaphylaxis   Shellfish Allergy Anaphylaxis   Shellfish-Derived Products Anaphylaxis   Amlodipine  Cough    LE Swelling   Trelegy Ellipta  [Fluticasone -Umeclidin-Vilant] Other (See Comments)    Dizzy and vision issues-tried twice    Past Medical History:  Diagnosis Date   Allergy    Asthma    Cellulitis of left upper extremity 06/27/2021   Diabetes mellitus without complication (HCC)    GERD (  gastroesophageal reflux disease)    Hypertension    Palpitations    a. 10/2019: Monitor showing rare PVC's and no significant arrhythmias.    Persistent cough for 3 weeks or longer 05/20/2021    Past Surgical History:  Procedure Laterality Date   CATARACT EXTRACTION Bilateral    02/23/2022, 03/28/2022   ELBOW SURGERY     KNEE SURGERY Right    TONSILLECTOMY      Family History  Problem Relation Age of Onset   Pulmonary embolism Father 39       Deceased   Heart attack Mother 70       Deceased   Diabetes Maternal Grandmother    Heart attack Sister     Social History   Socioeconomic History   Marital  status: Widowed    Spouse name: Not on file   Number of children: Not on file   Years of education: Not on file   Highest education level: 8th grade  Occupational History   Not on file  Tobacco Use   Smoking status: Former    Current packs/day: 0.00    Average packs/day: 4.0 packs/day for 12.0 years (48.0 ttl pk-yrs)    Types: Cigarettes    Start date: 61    Quit date: 71    Years since quitting: 49.5   Smokeless tobacco: Never   Tobacco comments:    used to smoke Lucky's  Vaping Use   Vaping status: Never Used  Substance and Sexual Activity   Alcohol use: Not Currently    Alcohol/week: 1.0 - 2.0 standard drink of alcohol    Types: 1 - 2 Standard drinks or equivalent per week    Comment: occ   Drug use: No   Sexual activity: Not on file  Other Topics Concern   Not on file  Social History Narrative   Married.   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   Enjoys working on [employer], yard work, gardening.   Right handed    Social Drivers of Health   Financial Resource Strain: Low Risk  (05/22/2024)   Overall Financial Resource Strain (CARDIA)    Difficulty of Paying Living Expenses: Not hard at all  Food Insecurity: No Food Insecurity (05/22/2024)   Hunger Vital Sign    Worried About Running Out of Food in the Last Year: Never true    Ran Out of Food in the Last Year: Never true  Transportation Needs: No Transportation Needs (05/22/2024)   PRAPARE - Administrator, Civil Service (Medical): No    Lack of Transportation (Non-Medical): No  Physical Activity: Inactive (05/22/2024)   Exercise Vital Sign    Days of Exercise per Week: 0 days    Minutes of Exercise per Session: Not on file  Stress: No Stress Concern Present (05/22/2024)   [employer] of Occupational Health - Occupational Stress Questionnaire    Feeling of Stress: Not at all  Social Connections: Moderately Isolated (05/22/2024)   Social Connection and Isolation Panel     Frequency of Communication with Friends and Family: Once a week    Frequency of Social Gatherings with Friends and Family: Once a week    Attends Religious Services: 1 to 4 times per year    Active Member of [employer] or Organizations: No    Attends Banker Meetings: Not on file    Marital Status: Living with partner  Intimate Partner Violence: Not At Risk (01/14/2024)  Humiliation, Afraid, Rape, and Kick questionnaire    Fear of Current or Ex-Partner: No    Emotionally Abused: No    Physically Abused: No    Sexually Abused: No   Review of Systems No fever Doesn't feel sick Stable SOB with the weather     Objective:   Physical Exam Skin:    Comments: Superficial ulcer on dorsum/proximal left 2nd toe Mild surrounding redness that looks more ecchymotic than infected            Assessment & Plan:

## 2024-05-22 NOTE — Assessment & Plan Note (Signed)
 Blister looked like friction based but he had just been sitting on his scooter before it came on No known insect bite Now looks clean--but given his diabetes, etc--will treat with doxy 100  bid x 5 days Clean and use neosporin daily

## 2024-05-29 ENCOUNTER — Other Ambulatory Visit: Payer: Self-pay | Admitting: Primary Care

## 2024-05-29 DIAGNOSIS — R3915 Urgency of urination: Secondary | ICD-10-CM

## 2024-05-29 DIAGNOSIS — K219 Gastro-esophageal reflux disease without esophagitis: Secondary | ICD-10-CM

## 2024-05-31 DIAGNOSIS — M9901 Segmental and somatic dysfunction of cervical region: Secondary | ICD-10-CM | POA: Diagnosis not present

## 2024-05-31 DIAGNOSIS — M9903 Segmental and somatic dysfunction of lumbar region: Secondary | ICD-10-CM | POA: Diagnosis not present

## 2024-05-31 DIAGNOSIS — M542 Cervicalgia: Secondary | ICD-10-CM | POA: Diagnosis not present

## 2024-05-31 DIAGNOSIS — M546 Pain in thoracic spine: Secondary | ICD-10-CM | POA: Diagnosis not present

## 2024-05-31 DIAGNOSIS — M9902 Segmental and somatic dysfunction of thoracic region: Secondary | ICD-10-CM | POA: Diagnosis not present

## 2024-05-31 DIAGNOSIS — M62830 Muscle spasm of back: Secondary | ICD-10-CM | POA: Diagnosis not present

## 2024-06-09 ENCOUNTER — Other Ambulatory Visit: Payer: Self-pay | Admitting: Primary Care

## 2024-06-09 DIAGNOSIS — J4540 Moderate persistent asthma, uncomplicated: Secondary | ICD-10-CM

## 2024-06-14 DIAGNOSIS — M9903 Segmental and somatic dysfunction of lumbar region: Secondary | ICD-10-CM | POA: Diagnosis not present

## 2024-06-14 DIAGNOSIS — M542 Cervicalgia: Secondary | ICD-10-CM | POA: Diagnosis not present

## 2024-06-14 DIAGNOSIS — M9901 Segmental and somatic dysfunction of cervical region: Secondary | ICD-10-CM | POA: Diagnosis not present

## 2024-06-14 DIAGNOSIS — M9902 Segmental and somatic dysfunction of thoracic region: Secondary | ICD-10-CM | POA: Diagnosis not present

## 2024-06-14 DIAGNOSIS — M546 Pain in thoracic spine: Secondary | ICD-10-CM | POA: Diagnosis not present

## 2024-06-14 DIAGNOSIS — M62830 Muscle spasm of back: Secondary | ICD-10-CM | POA: Diagnosis not present

## 2024-06-15 ENCOUNTER — Other Ambulatory Visit: Payer: Self-pay

## 2024-06-15 DIAGNOSIS — H938X3 Other specified disorders of ear, bilateral: Secondary | ICD-10-CM

## 2024-06-15 MED ORDER — FLUTICASONE PROPIONATE 50 MCG/ACT NA SUSP: 1.0000 | Freq: Two times a day (BID) | NASAL | 0 refills | Status: DC | PRN

## 2024-06-16 ENCOUNTER — Ambulatory Visit: Admitting: Primary Care

## 2024-06-22 ENCOUNTER — Telehealth: Payer: Self-pay

## 2024-06-22 NOTE — Telephone Encounter (Signed)
 Copied from CRM [id number]. Topic: Clinical - Prescription Issue >> Jun 22, 2024  2:11 PM Paige D wrote: Reason for CRM: Pt wife is calling in regards to his Ozempic  pt has been on the medication for 3 months pt picked up prescription and the dosage is 0.25 and 0.5 and pt is wondering why he is not at the 1 yet as its 3 months pt would like a higher dosage or if provider is going to keep pt on the lower dosage pt states they should be told this so they are not confused.

## 2024-06-22 NOTE — Telephone Encounter (Signed)
 Please call patient and his wife:  We will discuss a potential dose increase once we evaluate the patient and his A1c level.  We can discuss at his upcoming visit.  Does he need a refill in the interim?

## 2024-06-22 NOTE — Telephone Encounter (Signed)
 Called and spoke with patient, advised of Meredith message, he verbalized understanding. He will continue 0.5mg  dose weekly for now. He has enough medication right now.

## 2024-06-28 DIAGNOSIS — M546 Pain in thoracic spine: Secondary | ICD-10-CM | POA: Diagnosis not present

## 2024-06-28 DIAGNOSIS — M542 Cervicalgia: Secondary | ICD-10-CM | POA: Diagnosis not present

## 2024-06-28 DIAGNOSIS — M9903 Segmental and somatic dysfunction of lumbar region: Secondary | ICD-10-CM | POA: Diagnosis not present

## 2024-06-28 DIAGNOSIS — M9902 Segmental and somatic dysfunction of thoracic region: Secondary | ICD-10-CM | POA: Diagnosis not present

## 2024-06-28 DIAGNOSIS — M62830 Muscle spasm of back: Secondary | ICD-10-CM | POA: Diagnosis not present

## 2024-06-28 DIAGNOSIS — M9901 Segmental and somatic dysfunction of cervical region: Secondary | ICD-10-CM | POA: Diagnosis not present

## 2024-07-05 DIAGNOSIS — M542 Cervicalgia: Secondary | ICD-10-CM | POA: Diagnosis not present

## 2024-07-05 DIAGNOSIS — M9901 Segmental and somatic dysfunction of cervical region: Secondary | ICD-10-CM | POA: Diagnosis not present

## 2024-07-05 DIAGNOSIS — M546 Pain in thoracic spine: Secondary | ICD-10-CM | POA: Diagnosis not present

## 2024-07-05 DIAGNOSIS — M9902 Segmental and somatic dysfunction of thoracic region: Secondary | ICD-10-CM | POA: Diagnosis not present

## 2024-07-05 DIAGNOSIS — M62830 Muscle spasm of back: Secondary | ICD-10-CM | POA: Diagnosis not present

## 2024-07-05 DIAGNOSIS — M9903 Segmental and somatic dysfunction of lumbar region: Secondary | ICD-10-CM | POA: Diagnosis not present

## 2024-07-06 ENCOUNTER — Ambulatory Visit: Admitting: Primary Care

## 2024-07-09 ENCOUNTER — Other Ambulatory Visit: Payer: Self-pay | Admitting: Primary Care

## 2024-07-09 DIAGNOSIS — E1165 Type 2 diabetes mellitus with hyperglycemia: Secondary | ICD-10-CM

## 2024-07-12 DIAGNOSIS — M9902 Segmental and somatic dysfunction of thoracic region: Secondary | ICD-10-CM | POA: Diagnosis not present

## 2024-07-12 DIAGNOSIS — M542 Cervicalgia: Secondary | ICD-10-CM | POA: Diagnosis not present

## 2024-07-12 DIAGNOSIS — M9901 Segmental and somatic dysfunction of cervical region: Secondary | ICD-10-CM | POA: Diagnosis not present

## 2024-07-12 DIAGNOSIS — M9903 Segmental and somatic dysfunction of lumbar region: Secondary | ICD-10-CM | POA: Diagnosis not present

## 2024-07-12 DIAGNOSIS — M546 Pain in thoracic spine: Secondary | ICD-10-CM | POA: Diagnosis not present

## 2024-07-12 DIAGNOSIS — M62830 Muscle spasm of back: Secondary | ICD-10-CM | POA: Diagnosis not present

## 2024-07-13 DIAGNOSIS — M1712 Unilateral primary osteoarthritis, left knee: Secondary | ICD-10-CM | POA: Diagnosis not present

## 2024-07-13 DIAGNOSIS — M25562 Pain in left knee: Secondary | ICD-10-CM | POA: Diagnosis not present

## 2024-07-13 DIAGNOSIS — M25662 Stiffness of left knee, not elsewhere classified: Secondary | ICD-10-CM | POA: Diagnosis not present

## 2024-07-13 DIAGNOSIS — R262 Difficulty in walking, not elsewhere classified: Secondary | ICD-10-CM | POA: Diagnosis not present

## 2024-07-19 DIAGNOSIS — M9901 Segmental and somatic dysfunction of cervical region: Secondary | ICD-10-CM | POA: Diagnosis not present

## 2024-07-19 DIAGNOSIS — M9903 Segmental and somatic dysfunction of lumbar region: Secondary | ICD-10-CM | POA: Diagnosis not present

## 2024-07-19 DIAGNOSIS — M62830 Muscle spasm of back: Secondary | ICD-10-CM | POA: Diagnosis not present

## 2024-07-19 DIAGNOSIS — M9902 Segmental and somatic dysfunction of thoracic region: Secondary | ICD-10-CM | POA: Diagnosis not present

## 2024-07-19 DIAGNOSIS — M546 Pain in thoracic spine: Secondary | ICD-10-CM | POA: Diagnosis not present

## 2024-07-19 DIAGNOSIS — M542 Cervicalgia: Secondary | ICD-10-CM | POA: Diagnosis not present

## 2024-07-20 DIAGNOSIS — M25562 Pain in left knee: Secondary | ICD-10-CM | POA: Diagnosis not present

## 2024-07-20 DIAGNOSIS — M1712 Unilateral primary osteoarthritis, left knee: Secondary | ICD-10-CM | POA: Diagnosis not present

## 2024-07-20 DIAGNOSIS — R262 Difficulty in walking, not elsewhere classified: Secondary | ICD-10-CM | POA: Diagnosis not present

## 2024-07-20 DIAGNOSIS — M25662 Stiffness of left knee, not elsewhere classified: Secondary | ICD-10-CM | POA: Diagnosis not present

## 2024-07-24 ENCOUNTER — Other Ambulatory Visit: Payer: Self-pay | Admitting: Primary Care

## 2024-07-24 DIAGNOSIS — E1169 Type 2 diabetes mellitus with other specified complication: Secondary | ICD-10-CM

## 2024-07-26 ENCOUNTER — Ambulatory Visit: Payer: Self-pay | Admitting: Primary Care

## 2024-07-26 ENCOUNTER — Ambulatory Visit: Admitting: Primary Care

## 2024-07-26 ENCOUNTER — Observation Stay (HOSPITAL_COMMUNITY)
Admission: EM | Admit: 2024-07-26 | Discharge: 2024-07-27 | Disposition: A | Discharge status: Home / Self Care | Attending: Internal Medicine | Admitting: Internal Medicine

## 2024-07-26 ENCOUNTER — Other Ambulatory Visit: Payer: Self-pay

## 2024-07-26 ENCOUNTER — Emergency Department (HOSPITAL_COMMUNITY)

## 2024-07-26 ENCOUNTER — Encounter (HOSPITAL_COMMUNITY): Payer: Self-pay

## 2024-07-26 ENCOUNTER — Encounter: Payer: Self-pay | Admitting: Primary Care

## 2024-07-26 VITALS — BP 122/60 | HR 75 | Temp 97.8°F | Ht 69.0 in | Wt 189.0 lb

## 2024-07-26 DIAGNOSIS — Z7984 Long term (current) use of oral hypoglycemic drugs: Secondary | ICD-10-CM

## 2024-07-26 DIAGNOSIS — J45909 Unspecified asthma, uncomplicated: Secondary | ICD-10-CM | POA: Insufficient documentation

## 2024-07-26 DIAGNOSIS — R55 Syncope and collapse: Secondary | ICD-10-CM | POA: Diagnosis not present

## 2024-07-26 DIAGNOSIS — Z7985 Long-term (current) use of injectable non-insulin antidiabetic drugs: Secondary | ICD-10-CM

## 2024-07-26 DIAGNOSIS — K219 Gastro-esophageal reflux disease without esophagitis: Secondary | ICD-10-CM

## 2024-07-26 DIAGNOSIS — E785 Hyperlipidemia, unspecified: Secondary | ICD-10-CM | POA: Diagnosis not present

## 2024-07-26 DIAGNOSIS — M9901 Segmental and somatic dysfunction of cervical region: Secondary | ICD-10-CM | POA: Diagnosis not present

## 2024-07-26 DIAGNOSIS — R638 Other symptoms and signs concerning food and fluid intake: Secondary | ICD-10-CM | POA: Diagnosis not present

## 2024-07-26 DIAGNOSIS — I10 Essential (primary) hypertension: Secondary | ICD-10-CM | POA: Diagnosis not present

## 2024-07-26 DIAGNOSIS — D72829 Elevated white blood cell count, unspecified: Secondary | ICD-10-CM | POA: Diagnosis not present

## 2024-07-26 DIAGNOSIS — Z79899 Other long term (current) drug therapy: Secondary | ICD-10-CM | POA: Diagnosis not present

## 2024-07-26 DIAGNOSIS — E1165 Type 2 diabetes mellitus with hyperglycemia: Secondary | ICD-10-CM

## 2024-07-26 DIAGNOSIS — E119 Type 2 diabetes mellitus without complications: Secondary | ICD-10-CM | POA: Insufficient documentation

## 2024-07-26 DIAGNOSIS — M62830 Muscle spasm of back: Secondary | ICD-10-CM | POA: Diagnosis not present

## 2024-07-26 DIAGNOSIS — R079 Chest pain, unspecified: Secondary | ICD-10-CM | POA: Diagnosis not present

## 2024-07-26 DIAGNOSIS — N400 Enlarged prostate without lower urinary tract symptoms: Secondary | ICD-10-CM | POA: Diagnosis not present

## 2024-07-26 DIAGNOSIS — M542 Cervicalgia: Secondary | ICD-10-CM | POA: Diagnosis not present

## 2024-07-26 DIAGNOSIS — Z23 Encounter for immunization: Secondary | ICD-10-CM

## 2024-07-26 DIAGNOSIS — M9902 Segmental and somatic dysfunction of thoracic region: Secondary | ICD-10-CM | POA: Diagnosis not present

## 2024-07-26 DIAGNOSIS — M9903 Segmental and somatic dysfunction of lumbar region: Secondary | ICD-10-CM | POA: Diagnosis not present

## 2024-07-26 DIAGNOSIS — M546 Pain in thoracic spine: Secondary | ICD-10-CM | POA: Diagnosis not present

## 2024-07-26 LAB — POCT GLYCOSYLATED HEMOGLOBIN (HGB A1C): Hemoglobin A1C: 6.2 % — AB (ref 4.0–5.6)

## 2024-07-26 LAB — TROPONIN I (HIGH SENSITIVITY)
Troponin I (High Sensitivity): 2 ng/L (ref <18)
Troponin I (High Sensitivity): 2 ng/L (ref <18)

## 2024-07-26 LAB — CBC
HCT: 40.1 % (ref 39.0–52.0)
Hemoglobin: 12.9 g/dL — ABNORMAL LOW (ref 13.0–17.0)
MCH: 28.9 pg (ref 26.0–34.0)
MCHC: 32.2 g/dL (ref 30.0–36.0)
MCV: 89.7 fL (ref 80.0–100.0)
Platelets: 285 K/uL (ref 150–400)
RBC: 4.47 MIL/uL (ref 4.22–5.81)
RDW: 13.2 % (ref 11.5–15.5)
WBC: 18.5 K/uL — ABNORMAL HIGH (ref 4.0–10.5)
nRBC: 0 % (ref 0.0–0.2)

## 2024-07-26 LAB — COMPREHENSIVE METABOLIC PANEL WITH GFR
ALT: 10 U/L (ref 0–44)
AST: 14 U/L — ABNORMAL LOW (ref 15–41)
Albumin: 3.9 g/dL (ref 3.5–5.0)
Alkaline Phosphatase: 45 U/L (ref 38–126)
Anion gap: 14 (ref 5–15)
BUN: 12 mg/dL (ref 8–23)
CO2: 24 mmol/L (ref 22–32)
Calcium: 8.8 mg/dL — ABNORMAL LOW (ref 8.9–10.3)
Chloride: 92 mmol/L — ABNORMAL LOW (ref 98–111)
Creatinine, Ser: 0.92 mg/dL (ref 0.61–1.24)
GFR, Estimated: >60 mL/min (ref >60)
Glucose, Bld: 195 mg/dL — ABNORMAL HIGH (ref 70–99)
Potassium: 3.5 mmol/L (ref 3.5–5.1)
Sodium: 130 mmol/L — ABNORMAL LOW (ref 135–145)
Total Bilirubin: 0.8 mg/dL (ref 0.0–1.2)
Total Protein: 6.7 g/dL (ref 6.5–8.1)

## 2024-07-26 LAB — CBG MONITORING, ED: Glucose-Capillary: 168 mg/dL — ABNORMAL HIGH (ref 70–99)

## 2024-07-26 MED ORDER — METFORMIN HCL 500 MG PO TABS: 500.0000 mg | ORAL_TABLET | Freq: Two times a day (BID) | ORAL | 1 refills | Status: AC

## 2024-07-26 MED ORDER — LACTATED RINGERS IV BOLUS
1000.0000 mL | Freq: Once | INTRAVENOUS | Status: AC
Start: 2024-07-26 — End: 2024-07-27
  Administered 2024-07-26: 1000 mL via INTRAVENOUS

## 2024-07-26 MED ORDER — OMEPRAZOLE 40 MG PO CPDR: 40.0000 mg | DELAYED_RELEASE_CAPSULE | Freq: Two times a day (BID) | ORAL | 1 refills | Status: AC

## 2024-07-26 NOTE — Progress Notes (Signed)
 Subjective:    Patient ID: Brian Mullen, male    DOB: 1946-01-25, 78 y.o.   MRN: [id number]  Brian Mullen is a very pleasant 78 y.o. male with a history of type 2 diabetes, hypertension, asthma, GERD, tobacco use, and balance who presents today for follow-up of diabetes.  He is also needing a refill of Spiriva .    Current medications include: Metformin  1000 mg twice daily, Ozempic  0.5 mg weekly. Decreased appetite. He denies nausea, diarrhea, constipation.   He continues to experience GERD which is worse since starting with Ozempic . He is managed on omeprazole  40 mg once daily Was managed on 40 mg BID   He is checking his  blood glucose 1 times daily AM fasting and is getting readings of low to mid 100s.   Last A1C: 8.2 in May 2025, 6.2 today Last Eye Exam: UTD Last Foot Exam: UTD Pneumonia Vaccination: UTD Urine Microalbumin: Due Statin: rosuvastatin    Dietary changes since last visit: Decreased appetite. Increased salads, lean protein.    Exercise: None.   BP Readings from Last 3 Encounters:  07/26/24 122/60  05/22/24 120/86  04/15/24 134/72    Wt Readings from Last 3 Encounters:  07/26/24 189 lb (85.7 kg)  05/22/24 195 lb (88.5 kg)  04/15/24 197 lb (89.4 kg)      Review of Systems  Respiratory:  Negative for shortness of breath.   Gastrointestinal:  Negative for constipation, diarrhea and nausea.       Increased GERD  Musculoskeletal:  Positive for arthralgias.         Past Medical History:  Diagnosis Date   Allergy    Asthma    Cellulitis of left upper extremity 06/27/2021   Diabetes mellitus without complication (HCC)    GERD (gastroesophageal reflux disease)    Hypertension    Palpitations    a. 10/2019: Monitor showing rare PVC's and no significant arrhythmias.    Persistent cough for 3 weeks or longer 05/20/2021    Social History   Socioeconomic History   Marital status: Widowed    Spouse name: Not on file   Number of children: Not on file    Years of education: Not on file   Highest education level: 8th grade  Occupational History   Not on file  Tobacco Use   Smoking status: Former    Current packs/day: 0.00    Average packs/day: 4.0 packs/day for 12.0 years (48.0 ttl pk-yrs)    Types: Cigarettes    Start date: 59    Quit date: 50    Years since quitting: 49.7   Smokeless tobacco: Never   Tobacco comments:    used to smoke Lucky's  Vaping Use   Vaping status: Never Used  Substance and Sexual Activity   Alcohol use: Not Currently    Alcohol/week: 1.0 - 2.0 standard drink of alcohol    Types: 1 - 2 Standard drinks or equivalent per week    Comment: occ   Drug use: No   Sexual activity: Not on file  Other Topics Concern   Not on file  Social History Narrative   Married.   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   Enjoys working on [employer], yard work, gardening.   Right handed    Social Drivers of Health   Financial Resource Strain: Low Risk  (05/22/2024)   Overall Financial Resource Strain (CARDIA)    Difficulty of Paying Living Expenses: Not hard at all  Food Insecurity: No Food Insecurity (05/22/2024)   Hunger Vital Sign    Worried About Running Out of Food in the Last Year: Never true    Ran Out of Food in the Last Year: Never true  Transportation Needs: No Transportation Needs (05/22/2024)   PRAPARE - Administrator, Civil Service (Medical): No    Lack of Transportation (Non-Medical): No  Physical Activity: Inactive (05/22/2024)   Exercise Vital Sign    Days of Exercise per Week: 0 days    Minutes of Exercise per Session: Not on file  Stress: No Stress Concern Present (05/22/2024)   [employer] of Occupational Health - Occupational Stress Questionnaire    Feeling of Stress: Not at all  Social Connections: Moderately Isolated (05/22/2024)   Social Connection and Isolation Panel    Frequency of Communication with Friends and Family: Once a week    Frequency of  Social Gatherings with Friends and Family: Once a week    Attends Religious Services: 1 to 4 times per year    Active Member of [employer] or Organizations: No    Attends Engineer, structural: Not on file    Marital Status: Living with partner  Intimate Partner Violence: Not At Risk (01/14/2024)   Humiliation, Afraid, Rape, and Kick questionnaire    Fear of Current or Ex-Partner: No    Emotionally Abused: No    Physically Abused: No    Sexually Abused: No    Past Surgical History:  Procedure Laterality Date   CATARACT EXTRACTION Bilateral    02/23/2022, 03/28/2022   ELBOW SURGERY     KNEE SURGERY Right    TONSILLECTOMY      Family History  Problem Relation Age of Onset   Pulmonary embolism Father 47       Deceased   Heart attack Mother 58       Deceased   Diabetes Maternal Grandmother    Heart attack Sister     Allergies  Allergen Reactions   Coconut (Cocos Nucifera) Anaphylaxis   Coconut Oil Anaphylaxis   Iodine Anaphylaxis   Shellfish Allergy Anaphylaxis   Shellfish-Derived Products Anaphylaxis   Amlodipine  Cough    LE Swelling   Trelegy Ellipta  [Fluticasone -Umeclidin-Vilant] Other (See Comments)    Dizzy and vision issues-tried twice    Current Outpatient Medications on File Prior to Visit  Medication Sig Dispense Refill   albuterol  (VENTOLIN  HFA) 108 (90 Base) MCG/ACT inhaler INHALE 1 TO 2 PUFFS EVERY 4 TO 6 HOURS AS NEEDED 1 each 0   Blood Glucose Monitoring Suppl DEVI 1 each by Does not apply route in the morning, at noon, and at bedtime. May substitute to any manufacturer covered by patient's insurance. 1 each 0   fexofenadine  (ALLEGRA ) 180 MG tablet Take 1 tablet (180 mg total) by mouth daily. For allergies 90 tablet 1   fluticasone  (FLONASE ) 50 MCG/ACT nasal spray Place 1 spray into both nostrils 2 (two) times daily as needed for allergies or rhinitis. 48 g 0   Glucose Blood (BLOOD GLUCOSE TEST STRIPS) STRP 1 each by In Vitro route in the morning, at noon,  and at bedtime. May substitute to any manufacturer covered by patient's insurance. 300 strip 1   Lancets Misc. MISC 1 each by Does not apply route in the morning, at noon, and at bedtime. May substitute to any manufacturer covered by patient's insurance. 300 each 1   latanoprost  (XALATAN ) 0.005 % ophthalmic solution SMARTSIG:1 Drop(s) In Eye(s) Every Evening  metoprolol  succinate (TOPROL -XL) 25 MG 24 hr tablet TAKE 1 TABLET EVERY DAY FOR BLOOD PRESSURE 90 tablet 3   montelukast  (SINGULAIR ) 10 MG tablet TAKE 1 TABLET EVERY DAY AT BEDTIME FOR ALLERGIES, ASTHMA 90 tablet 2   rosuvastatin  (CRESTOR ) 5 MG tablet TAKE 1 TABLET EVERY DAY FOR CHOLESTEROL 90 tablet 1   Semaglutide ,0.25 or 0.5MG /DOS, (OZEMPIC , 0.25 OR 0.5 MG/DOSE,) 2 MG/3ML SOPN INJECT 0.25 MG INTO THE SKIN ONCE A WEEK FOR 4 WEEKS THEN 0.5 MG INTO THE SKIN ONCE A WEEK FOR DIABETES 3 mL 0   tamsulosin  (FLOMAX ) 0.4 MG CAPS capsule TAKE 2 CAPSULES EVERY DAY FOR URINE FLOW 180 capsule 2   Tiotropium Bromide  Monohydrate (SPIRIVA  RESPIMAT) 1.25 MCG/ACT AERS Inhale 2 puffs into the lungs daily. 4 g 11   tiZANidine  (ZANAFLEX ) 4 MG tablet TAKE 1 TABLET AT BEDTIME AS NEEDED FOR MUSCLE SPASM(S) 30 tablet 0   doxycycline  (VIBRA -TABS) 100 MG tablet Take 1 tablet (100 mg total) by mouth 2 (two) times daily. (Patient not taking: Reported on 07/26/2024) 10 tablet 0   No current facility-administered medications on file prior to visit.    BP 122/60   Pulse 75   Temp 97.8 F (36.6 C) (Temporal)   Ht 5' 9 (1.753 m)   Wt 189 lb (85.7 kg)   SpO2 96%   BMI 27.91 kg/m  Objective:   Physical Exam Cardiovascular:     Rate and Rhythm: Normal rate and regular rhythm.  Pulmonary:     Effort: Pulmonary effort is normal.     Breath sounds: Normal breath sounds.  Musculoskeletal:     Cervical back: Neck supple.  Skin:    General: Skin is warm and dry.  Neurological:     Mental Status: He is alert and oriented to person, place, and time.  Psychiatric:         Mood and Affect: Mood normal.     Physical Exam        Assessment & Plan:  Type 2 diabetes mellitus with hyperglycemia, without long-term current use of insulin  (HCC) Assessment & Plan: Well controlled and improved with A1C of 6.2 today.  Commended him on weight loss.  Reduce metformin  to 500 mg BID. Continue Ozempic  0.5 mg weekly for now. Consider increase to 1 mg weekly if GERD symptoms improve and if weight reaches a plateau. He will update.   Urine microalbumin pending.   Follow up in 6 months.   Orders: -     POCT glycosylated hemoglobin (Hb A1C) -     Microalbumin / creatinine urine ratio -     metFORMIN  HCl; Take 1 tablet (500 mg total) by mouth 2 (two) times daily with a meal. for diabetes.  Dispense: 180 tablet; Refill: 1  Encounter for immunization -     Flu vaccine HIGH DOSE PF(Fluzone Trivalent)  Gastroesophageal reflux disease, unspecified whether esophagitis present Assessment & Plan: Deteriorated, likely due to GLP 1 agonist treatment.  We discussed options.  As he did well historically on omeprazole  40 mg twice daily will increase this dose.  Increase omeprazole  to 40 mg twice daily for now.  Will reevaluate at next visit. We also discussed trigger foods.  Orders: -     Omeprazole ; Take 1 capsule (40 mg total) by mouth 2 (two) times daily. for heartburn.  Dispense: 180 capsule; Refill: 1    Assessment and Plan Assessment & Plan         Comer MARLA Gaskins, NP   History of  Present Illness

## 2024-07-26 NOTE — Assessment & Plan Note (Signed)
 Deteriorated, likely due to GLP 1 agonist treatment.  We discussed options.  As he did well historically on omeprazole  40 mg twice daily will increase this dose.  Increase omeprazole  to 40 mg twice daily for now.  Will reevaluate at next visit. We also discussed trigger foods.

## 2024-07-26 NOTE — ED Triage Notes (Addendum)
 [facility] from home  LOC x2- one with family while sitting on the couch. And one with ems. HR dropped to 40s with ems but when back up to 90s.. CBG 186. Chest pressure that feels like indigestion. C/o light headed and dizziness. Denies hitting his head with LOC.  Stroke screen negative. Went to doctor today for check up.

## 2024-07-26 NOTE — Assessment & Plan Note (Signed)
 Well controlled and improved with A1C of 6.2 today.  Commended him on weight loss.  Reduce metformin  to 500 mg BID. Continue Ozempic  0.5 mg weekly for now. Consider increase to 1 mg weekly if GERD symptoms improve and if weight reaches a plateau. He will update.   Urine microalbumin pending.   Follow up in 6 months.

## 2024-07-26 NOTE — Addendum Note (Signed)
 Addended by: Greidys Deland E on: 07/26/2024 04:37 PM   Modules accepted: Orders

## 2024-07-26 NOTE — Patient Instructions (Signed)
 Stop by the lab prior to leaving today. I will notify you of your results once received.   We increased your omeprazole  heartburn pill to 40 mg twice daily.   Please schedule a follow up visit for 6 months for a diabetes check.  It was a pleasure to see you today!

## 2024-07-26 NOTE — ED Provider Notes (Signed)
 [place] EMERGENCY DEPARTMENT AT [facility] Provider Note   CSN: [id number] Arrival date & time: 07/26/24  2024     History {Add pertinent medical, surgical, social history, OB history to HPI:1} Chief Complaint  Patient presents with  . Loss of Consciousness    Brian Mullen is a 78 y.o. male with T2DM, HTN, asthma, GERD, tobacco use who presents with syncope, lightheadedness. Began to feel very lightheaded earlier today and had two episodes of loss of consciousness witnessed by his wife. Was sitting in the truck when it happened and did not fall or hit his head. Denies chest pain but complains of indigestion and a burning reflux sensation. No shortness of breath/cough, has baseline asthma. No abdominal pain. No numbness/tingling, asymmetric weakness, facial droop, seizure-like activity. No h/o similar. Said that during the episode it felt like his heart was skipping every 3rd beat and was irregular. Was started on ozempic  2 months ago for high A1c and notes that he has had no appetite. Denies nausea/vomiting, diarrhea, melena, hematochezia. Doesn't take a blood thinner. No h/o DVT/PE. Per chart review, saw his PCP today where he did report decreased appetite. Got a flu shot today. Feels improved now. Denies recent illnesses, f/c.   Past Medical History:  Diagnosis Date  . Allergy   . Asthma   . Cellulitis of left upper extremity 06/27/2021  . Diabetes mellitus without complication (HCC)   . GERD (gastroesophageal reflux disease)   . Hypertension   . Palpitations    a. 10/2019: Monitor showing rare PVC's and no significant arrhythmias.   . Persistent cough for 3 weeks or longer 05/20/2021       Home Medications Prior to Admission medications   Medication Sig Start Date End Date Taking? Authorizing Provider  albuterol  (VENTOLIN  HFA) 108 (90 Base) MCG/ACT inhaler INHALE 1 TO 2 PUFFS EVERY 4 TO 6 HOURS AS NEEDED 05/12/24   Clark, Katherine K, NP  Blood Glucose  Monitoring Suppl DEVI 1 each by Does not apply route in the morning, at noon, and at bedtime. May substitute to any manufacturer covered by patient's insurance. 06/17/23   Gretta Comer POUR, NP  doxycycline  (VIBRA -TABS) 100 MG tablet Take 1 tablet (100 mg total) by mouth 2 (two) times daily. Patient not taking: Reported on 07/26/2024 05/22/24   Jimmy Charlie FERNS, MD  fexofenadine  (ALLEGRA ) 180 MG tablet Take 1 tablet (180 mg total) by mouth daily. For allergies 12/18/22   Clark, Katherine K, NP  fluticasone  (FLONASE ) 50 MCG/ACT nasal spray Place 1 spray into both nostrils 2 (two) times daily as needed for allergies or rhinitis. 06/15/24   Clark, Katherine K, NP  Glucose Blood (BLOOD GLUCOSE TEST STRIPS) STRP 1 each by In Vitro route in the morning, at noon, and at bedtime. May substitute to any manufacturer covered by patient's insurance. 06/17/23   Gretta Comer POUR, NP  Lancets Misc. MISC 1 each by Does not apply route in the morning, at noon, and at bedtime. May substitute to any manufacturer covered by patient's insurance. 06/17/23   Clark, Katherine K, NP  latanoprost  (XALATAN ) 0.005 % ophthalmic solution SMARTSIG:1 Drop(s) In Eye(s) Every Evening 11/21/21   [provider]  metFORMIN  (GLUCOPHAGE ) 500 MG tablet Take 1 tablet (500 mg total) by mouth 2 (two) times daily with a meal. for diabetes. 07/26/24   Clark, Katherine K, NP  metoprolol  succinate (TOPROL -XL) 25 MG 24 hr tablet TAKE 1 TABLET EVERY DAY FOR BLOOD PRESSURE 04/14/24   Clark, Katherine K,  NP  montelukast  (SINGULAIR ) 10 MG tablet TAKE 1 TABLET EVERY DAY AT BEDTIME FOR ALLERGIES, ASTHMA 04/21/24   Clark, Katherine K, NP  omeprazole  (PRILOSEC) 40 MG capsule Take 1 capsule (40 mg total) by mouth 2 (two) times daily. for heartburn. 07/26/24   Clark, Katherine K, NP  rosuvastatin  (CRESTOR ) 5 MG tablet TAKE 1 TABLET EVERY DAY FOR CHOLESTEROL 07/24/24   Clark, Katherine K, NP  Semaglutide ,0.25 or 0.5MG /DOS, (OZEMPIC , 0.25 OR 0.5 MG/DOSE,) 2 MG/3ML SOPN  INJECT 0.25 MG INTO THE SKIN ONCE A WEEK FOR 4 WEEKS THEN 0.5 MG INTO THE SKIN ONCE A WEEK FOR DIABETES 07/09/24   Gretta Comer POUR, NP  tamsulosin  (FLOMAX ) 0.4 MG CAPS capsule TAKE 2 CAPSULES EVERY DAY FOR URINE FLOW 05/29/24   Clark, Katherine K, NP  Tiotropium Bromide  Monohydrate (SPIRIVA  RESPIMAT) 1.25 MCG/ACT AERS Inhale 2 puffs into the lungs daily. 04/04/24   Clark, Katherine K, NP  tiZANidine  (ZANAFLEX ) 4 MG tablet TAKE 1 TABLET AT BEDTIME AS NEEDED FOR MUSCLE SPASM(S) 04/12/24   Clark, Katherine K, NP      Allergies    Coconut (cocos nucifera), Coconut oil, Iodine, Shellfish allergy, Shellfish-derived products, Amlodipine , and Trelegy ellipta  [fluticasone -umeclidin-vilant]    Review of Systems   Review of Systems A 10 point review of systems was performed and is negative unless otherwise reported in HPI.  Physical Exam Updated Vital Signs BP 126/68   Pulse 92   Temp 99.1 F (37.3 C)   Resp 18   Ht 5' 9 (1.753 m)   Wt 85.7 kg   SpO2 95%   BMI 27.91 kg/m  Physical Exam General: Normal appearing male, lying in bed.  HEENT: NCAT, EOMI, PERRLA, Sclera anicteric, MMM, trachea midline.  Poor dentition. Cardiology: RRR, no murmurs/rubs/gallops. BL radial and DP pulses equal bilaterally.  Resp: Normal respiratory rate and effort. CTAB, no wheezes, rhonchi, crackles.  Abd: Soft, non-tender, non-distended. No rebound tenderness or guarding.  GU: Deferred. MSK: No peripheral edema or signs of trauma. Extremities without deformity or TTP. No cyanosis or clubbing. Skin: warm, dry. Neuro: A&Ox4, CNs II-XII grossly intact. MAEs. Sensation grossly intact.  Psych: Normal mood and affect.   ED Results / Procedures / Treatments   Labs (all labs ordered are listed, but only abnormal results are displayed) Labs Reviewed  COMPREHENSIVE METABOLIC PANEL WITH GFR - Abnormal; Notable for the following components:      Result Value   Sodium 130 (*)    Chloride 92 (*)    Glucose, Bld 195 (*)     Calcium  8.8 (*)    AST 14 (*)    All other components within normal limits  CBC - Abnormal; Notable for the following components:   WBC 18.5 (*)    Hemoglobin 12.9 (*)    All other components within normal limits  CBG MONITORING, ED - Abnormal; Notable for the following components:   Glucose-Capillary 168 (*)    All other components within normal limits  URINALYSIS, ROUTINE W REFLEX MICROSCOPIC  TROPONIN I (HIGH SENSITIVITY)  TROPONIN I (HIGH SENSITIVITY)    EKG None  Radiology No results found.  Procedures Procedures  {Document cardiac monitor, telemetry assessment procedure when appropriate:1}  Medications Ordered in ED Medications  lactated ringers  bolus 1,000 mL (has no administration in time range)    ED Course/ Medical Decision Making/ A&P  Medical Decision Making Amount and/or Complexity of Data Reviewed Labs: ordered. Decision-making details documented in ED Course. Radiology: ordered.  Risk Decision regarding hospitalization.    This patient presents to the ED for concern of ***, this involves an extensive number of treatment options, and is a complaint that carries with it a high risk of complications and morbidity.  I considered the following differential and admission for this acute, potentially life threatening condition.   MDM:    DDX for syncope includes but is not limited to:  Consider anemia and electrolyte abnormalities as possible etiologies. Consider arrhythmias and ACS, although without associated symptoms, less likely. Consider hemorrhage vs CVA, although neuro intact now for 24 hours and no associated other symptoms. Consider mets given known underlying cancer. Consider PE as well, although without hypoxia or sob. Consider broad differential.  *** Given history, exam and workup, low suspicion for HF, ICH (no trauma, headache), seizure (no witnessed seizure like activity, no postictal period, tongue laceration,  bladder incontinence), stroke (no focal neuro deficits), HOCM (no murmur, family history of sudden death), ACS (neg troponin, no anginal pain), aortic dissection (no chest pain), malignant arrhythmia on ekg or any family history of sudden death, or GI bleed (stable hgb). Low suspicion for PE given normal vital signs, absence of chest pain or dyspnea, no evidence of DVT, no recent surgery/immobilization.   Clinical Course as of 07/26/24 2255  Wed Jul 26, 2024  2253 WBC(!): 18.5 +leukocytosis [HN]  2254 Sodium(!): 130 Corrects to 132 [HN]  2254 Troponin I (High Sensitivity): 2 wnl [HN]    Clinical Course User Index [HN] Franklyn Sid SAILOR, MD    Labs: I Ordered, and personally interpreted labs.  The pertinent results include:  ***  Imaging Studies ordered: I ordered imaging studies including CXR I independently visualized and interpreted imaging. I agree with the radiologist interpretation  Additional history obtained from ***.  External records from outside source obtained and reviewed including ***  Cardiac Monitoring: .The patient was maintained on a cardiac monitor.  I personally viewed and interpreted the cardiac monitored which showed an underlying rhythm of: ***  Reevaluation: After the interventions noted above, I reevaluated the patient and found that they have :{resolved/improved/worsened:23923::improved}  Social Determinants of Health: .***  Disposition:  ***  Co morbidities that complicate the patient evaluation . Past Medical History:  Diagnosis Date  . Allergy   . Asthma   . Cellulitis of left upper extremity 06/27/2021  . Diabetes mellitus without complication (HCC)   . GERD (gastroesophageal reflux disease)   . Hypertension   . Palpitations    a. 10/2019: Monitor showing rare PVC's and no significant arrhythmias.   . Persistent cough for 3 weeks or longer 05/20/2021     Medicines Meds ordered this encounter  Medications  . lactated ringers  bolus  1,000 mL    I have reviewed the patients home medicines and have made adjustments as needed  Problem List / ED Course: Problem List Items Addressed This Visit   None        {Document critical care time when appropriate:1} {Document review of labs and clinical decision tools ie heart score, Chads2Vasc2 etc:1}  {Document your independent review of radiology images, and any outside records:1} {Document your discussion with family members, caretakers, and with consultants:1} {Document social determinants of health affecting pt's care:1} {Document your decision making why or why not admission, treatments were needed:1}  This note was created using dictation software, which may contain spelling or grammatical  errors.

## 2024-07-27 ENCOUNTER — Other Ambulatory Visit

## 2024-07-27 ENCOUNTER — Encounter (HOSPITAL_COMMUNITY): Payer: Self-pay | Admitting: Internal Medicine

## 2024-07-27 ENCOUNTER — Telehealth: Payer: Self-pay | Admitting: *Deleted

## 2024-07-27 DIAGNOSIS — E1165 Type 2 diabetes mellitus with hyperglycemia: Secondary | ICD-10-CM

## 2024-07-27 DIAGNOSIS — R55 Syncope and collapse: Secondary | ICD-10-CM

## 2024-07-27 DIAGNOSIS — I10 Essential (primary) hypertension: Secondary | ICD-10-CM

## 2024-07-27 LAB — CBC
HCT: 37.4 % — ABNORMAL LOW (ref 39.0–52.0)
Hemoglobin: 12.1 g/dL — ABNORMAL LOW (ref 13.0–17.0)
MCH: 28.6 pg (ref 26.0–34.0)
MCHC: 32.4 g/dL (ref 30.0–36.0)
MCV: 88.4 fL (ref 80.0–100.0)
Platelets: 270 K/uL (ref 150–400)
RBC: 4.23 MIL/uL (ref 4.22–5.81)
RDW: 13.2 % (ref 11.5–15.5)
WBC: 16 K/uL — ABNORMAL HIGH (ref 4.0–10.5)
nRBC: 0 % (ref 0.0–0.2)

## 2024-07-27 LAB — BASIC METABOLIC PANEL WITH GFR
Anion gap: 9 (ref 5–15)
BUN: 10 mg/dL (ref 8–23)
CO2: 26 mmol/L (ref 22–32)
Calcium: 8.3 mg/dL — ABNORMAL LOW (ref 8.9–10.3)
Chloride: 98 mmol/L (ref 98–111)
Creatinine, Ser: 0.83 mg/dL (ref 0.61–1.24)
GFR, Estimated: >60 mL/min (ref >60)
Glucose, Bld: 137 mg/dL — ABNORMAL HIGH (ref 70–99)
Potassium: 3.8 mmol/L (ref 3.5–5.1)
Sodium: 133 mmol/L — ABNORMAL LOW (ref 135–145)

## 2024-07-27 LAB — URINALYSIS, W/ REFLEX TO CULTURE (INFECTION SUSPECTED)
Bacteria, UA: NONE SEEN
Bilirubin Urine: NEGATIVE
Glucose, UA: NEGATIVE mg/dL
Ketones, ur: NEGATIVE mg/dL
Leukocytes,Ua: NEGATIVE
Nitrite: NEGATIVE
Protein, ur: NEGATIVE mg/dL
Specific Gravity, Urine: 1.009 (ref 1.005–1.030)
pH: 7 (ref 5.0–8.0)

## 2024-07-27 LAB — TSH: TSH: 0.692 u[IU]/mL (ref 0.350–4.500)

## 2024-07-27 LAB — PHOSPHORUS: Phosphorus: 2.6 mg/dL (ref 2.5–4.6)

## 2024-07-27 LAB — GLUCOSE, CAPILLARY
Glucose-Capillary: 128 mg/dL — ABNORMAL HIGH (ref 70–99)
Glucose-Capillary: 194 mg/dL — ABNORMAL HIGH (ref 70–99)
Glucose-Capillary: 114 mg/dL — ABNORMAL HIGH (ref 70–99)

## 2024-07-27 LAB — MAGNESIUM: Magnesium: 1.7 mg/dL (ref 1.7–2.4)

## 2024-07-27 MED ORDER — LACTATED RINGERS IV BOLUS
500.0000 mL | Freq: Once | INTRAVENOUS | Status: AC
  Administered 2024-07-27: 500 mL via INTRAVENOUS

## 2024-07-27 MED ORDER — TIOTROPIUM BROMIDE MONOHYDRATE 1.25 MCG/ACT IN AERS: 2.0000 | INHALATION_SPRAY | Freq: Every day | RESPIRATORY_TRACT | Status: DC

## 2024-07-27 MED ORDER — ALUM & MAG HYDROXIDE-SIMETH 200-200-20 MG/5ML PO SUSP
30.0000 mL | Freq: Four times a day (QID) | ORAL | Status: DC | PRN
  Administered 2024-07-27: 30 mL via ORAL
  Filled 2024-07-27: qty 30

## 2024-07-27 MED ORDER — INSULIN ASPART 100 UNIT/ML IJ SOLN
0.0000 [IU] | Freq: Three times a day (TID) | INTRAMUSCULAR | Status: DC
  Administered 2024-07-27: 1 [IU] via SUBCUTANEOUS

## 2024-07-27 MED ORDER — ALBUTEROL SULFATE (2.5 MG/3ML) 0.083% IN NEBU: 2.5000 mg | INHALATION_SOLUTION | RESPIRATORY_TRACT | Status: DC | PRN

## 2024-07-27 MED ORDER — TAMSULOSIN HCL 0.4 MG PO CAPS
0.8000 mg | ORAL_CAPSULE | Freq: Every day | ORAL | Status: DC
  Administered 2024-07-27: 0.8 mg via ORAL
  Filled 2024-07-27: qty 2

## 2024-07-27 MED ORDER — POLYETHYLENE GLYCOL 3350 17 G PO PACK: 17.0000 g | PACK | Freq: Every day | ORAL | Status: DC | PRN

## 2024-07-27 MED ORDER — POTASSIUM CHLORIDE CRYS ER 20 MEQ PO TBCR
40.0000 meq | EXTENDED_RELEASE_TABLET | Freq: Once | ORAL | Status: AC
  Administered 2024-07-27: 40 meq via ORAL
  Filled 2024-07-27: qty 2

## 2024-07-27 MED ORDER — ACETAMINOPHEN 500 MG PO TABS: 1000.0000 mg | ORAL_TABLET | Freq: Four times a day (QID) | ORAL | Status: DC | PRN

## 2024-07-27 MED ORDER — PANTOPRAZOLE SODIUM 40 MG PO TBEC
40.0000 mg | DELAYED_RELEASE_TABLET | Freq: Two times a day (BID) | ORAL | Status: DC
  Administered 2024-07-27 (×2): 40 mg via ORAL
  Filled 2024-07-27 (×2): qty 1

## 2024-07-27 MED ORDER — LATANOPROST 0.005 % OP SOLN
1.0000 [drp] | Freq: Every day | OPHTHALMIC | Status: DC
  Administered 2024-07-27: 1 [drp] via OPHTHALMIC
  Filled 2024-07-27: qty 2.5

## 2024-07-27 MED ORDER — MONTELUKAST SODIUM 10 MG PO TABS
10.0000 mg | ORAL_TABLET | Freq: Every day | ORAL | Status: DC
  Administered 2024-07-27: 10 mg via ORAL
  Filled 2024-07-27: qty 1

## 2024-07-27 MED ORDER — MELATONIN 3 MG PO TABS: 6.0000 mg | ORAL_TABLET | Freq: Every evening | ORAL | Status: DC | PRN

## 2024-07-27 MED ORDER — SODIUM CHLORIDE 0.9% FLUSH
3.0000 mL | Freq: Two times a day (BID) | INTRAVENOUS | Status: DC
  Administered 2024-07-27 (×2): 3 mL via INTRAVENOUS

## 2024-07-27 MED ORDER — UMECLIDINIUM BROMIDE 62.5 MCG/ACT IN AEPB: 1.0000 | INHALATION_SPRAY | Freq: Every day | RESPIRATORY_TRACT | Status: DC

## 2024-07-27 MED ORDER — ROSUVASTATIN CALCIUM 5 MG PO TABS
5.0000 mg | ORAL_TABLET | Freq: Every day | ORAL | Status: DC
  Administered 2024-07-27: 5 mg via ORAL
  Filled 2024-07-27 (×2): qty 1

## 2024-07-27 MED ORDER — ENOXAPARIN SODIUM 40 MG/0.4ML IJ SOSY
40.0000 mg | PREFILLED_SYRINGE | INTRAMUSCULAR | Status: DC
  Administered 2024-07-27: 40 mg via SUBCUTANEOUS
  Filled 2024-07-27: qty 0.4

## 2024-07-27 MED ORDER — ONDANSETRON HCL 4 MG/2ML IJ SOLN: 4.0000 mg | Freq: Four times a day (QID) | INTRAMUSCULAR | Status: DC | PRN

## 2024-07-27 MED ORDER — METOPROLOL SUCCINATE ER 25 MG PO TB24
12.5000 mg | ORAL_TABLET | Freq: Every day | ORAL | 3 refills | Status: DC
Start: 2024-07-27 — End: 2024-09-05

## 2024-07-27 NOTE — TOC CM/SW Note (Signed)
 Transition of Care [facility]) - Inpatient Brief Assessment   Patient Details  Name: Brian Mullen MRN: [id number] Date of Birth: August 22, 1946  Transition of Care [facility]) CM/SW Contact:    Lucie Lunger, LCSWA Phone Number: 07/27/2024, 8:59 AM   Clinical Narrative: Transition of Care Department [facility]) has reviewed patient and no TOC needs have been identified at this time. We will continue to monitor patient advancement through interdiciplinary progression rounds. If new patient transition needs arise, please place a TOC consult.  Transition of Care Asessment: Insurance and Status: Insurance coverage has been reviewed Patient has primary care physician: Yes Home environment has been reviewed: From home Prior level of function:: Independent Prior/Current Home Services: No current home services Social Drivers of Health Review: SDOH reviewed no interventions necessary Readmission risk has been reviewed: Yes Transition of care needs: no transition of care needs at this time

## 2024-07-27 NOTE — Progress Notes (Signed)
 Pt admitted to central telemetry and reading NSR with rate in 80's. Kellogg RN

## 2024-07-27 NOTE — Plan of Care (Signed)

## 2024-07-27 NOTE — Discharge Summary (Signed)
 Physician Discharge Summary   Patient: Brian Mullen MRN: [id number] DOB: 19-Aug-1946  Admit date:     07/26/2024  Discharge date: 07/27/24  Discharge Physician: Eric Nunnery   PCP: Gretta Comer POUR, NP   Recommendations at discharge:  Repeat basic metabolic panel to follow electrolytes renal function Reassess blood pressure and adjust antihypertensive regimen as needed. Make sure patient follow-up with cardiology service as instructed Continue close monitoring of patient's CBGs/A1c with adjustment to hypoglycemic regimen as required.  Discharge Diagnoses: Principal Problem:   Syncope Active Problems:   Essential hypertension BPH  Brief Hospital admission narrative: As per H&P written by Dr. Keturah on 07/27/2024 Brian Mullen is a 78 y.o. male with hx of HTN, hx PVC on metoprolol , DM type 2, HLD, asthma, BPH, GERD, who presented after recurrent syncopal episodes. Reports having 3 clustered syncopal episodes. Had started yesterday evening, when going to put out the trash. Developed heart racing with sensation of intermittent skipped and hard beats. With associated lightheadedness. No chest pain. He would have this start, then sit down, and get up and would recur. When taking trash out had a syncopal episode. Then had additional 2 episodes coming back into the house. No similar events recently. No recent change in medications. Did have flu shot yesterday     Assessment and Plan: 1-syncope -In the setting of dehydration, orthostasis - Patient reporting not drinking much and likely not having good oral intake after treatment initiation with Ozempic . - No EKG abnormalities appreciated - After fluid resuscitation no orthostasis seen - Patient symptoms completely resolved after IV fluids. - Discharged home with instructions to maintain adequate hydration and had his metoprolol  dose adjusted - 2 event monitoring requested at discharge.  2-leukocytosis - In the setting of a stress  demargination - No signs of acute infection appreciated - Chest x-ray was negative-urinalysis not demonstrating abnormalities - After fluid resuscitation WBCs numbers improved. - Patient advised to maintain adequate hydration - Repeat CBC at follow-up visit.  3-hypertension - Continue adjusted dose of metoprolol  and continue also treatment with Flomax .  4-BPH - Continue Flomax  nightly  5-type 2 diabetes mellitus - Continue metformin  and Ozempic  - Patient advised to maintain adequate hydration and to follow a modified carbohydrate diet.  6-hyperlipidemia - Continue Crestor .  7-GERD - Continue PPI.  8-history of asthma - Resume home bronchodilator management and the use of Singulair  - No acute exacerbation appreciated. - Good saturation on room air.  Consultants: none  Procedures performed: see below for x-ray reports  Disposition: home Diet recommendation: heart healthy diet and low modified carb diet.  DISCHARGE MEDICATION: Allergies as of 07/27/2024       Reactions   Breo Ellipta  [fluticasone  Furoate-vilanterol] Anaphylaxis   Coconut (cocos Nucifera) Anaphylaxis   Coconut Oil Anaphylaxis   Iodine Anaphylaxis   Shellfish Allergy Anaphylaxis   Shellfish-derived Products Anaphylaxis   Amlodipine  Swelling, Cough   LE Swelling   Trelegy Ellipta  [fluticasone -umeclidin-vilant] Other (See Comments)   Dizzy and vision issues - tried twice        Medication List     STOP taking these medications    bismuth subsalicylate 262 MG/15ML suspension Commonly known as: PEPTO BISMOL   DAYQUIL PO   tiZANidine  4 MG tablet Commonly known as: ZANAFLEX        TAKE these medications    albuterol  108 (90 Base) MCG/ACT inhaler Commonly known as: VENTOLIN  HFA INHALE 1 TO 2 PUFFS EVERY 4 TO 6 HOURS AS NEEDED What changed: See the new instructions.  Blood Glucose Monitoring Suppl Devi 1 each by Does not apply route in the morning, at noon, and at bedtime. May substitute  to any manufacturer covered by patient's insurance.   BLOOD GLUCOSE TEST STRIPS Strp 1 each by In Vitro route in the morning, at noon, and at bedtime. May substitute to any manufacturer covered by patient's insurance.   fexofenadine  180 MG tablet Commonly known as: ALLEGRA  Take 1 tablet (180 mg total) by mouth daily. For allergies   fluticasone  50 MCG/ACT nasal spray Commonly known as: FLONASE  Place 1 spray into both nostrils 2 (two) times daily as needed for allergies or rhinitis. What changed: when to take this   Lancets Misc. Misc 1 each by Does not apply route in the morning, at noon, and at bedtime. May substitute to any manufacturer covered by patient's insurance.   latanoprost  0.005 % ophthalmic solution Commonly known as: XALATAN  Place 1 drop into both eyes at bedtime.   metFORMIN  500 MG tablet Commonly known as: GLUCOPHAGE  Take 1 tablet (500 mg total) by mouth 2 (two) times daily with a meal. for diabetes.   metoprolol  succinate 25 MG 24 hr tablet Commonly known as: TOPROL -XL Take 0.5 tablets (12.5 mg total) by mouth daily. What changed: See the new instructions.   montelukast  10 MG tablet Commonly known as: SINGULAIR  TAKE 1 TABLET EVERY DAY AT BEDTIME FOR ALLERGIES, ASTHMA What changed: See the new instructions.   omeprazole  40 MG capsule Commonly known as: PRILOSEC Take 1 capsule (40 mg total) by mouth 2 (two) times daily. for heartburn. What changed: additional instructions   Ozempic  (0.25 or 0.5 MG/DOSE) 2 MG/3ML Sopn Generic drug: Semaglutide (0.25 or 0.5MG /DOS) INJECT 0.25 MG INTO THE SKIN ONCE A WEEK FOR 4 WEEKS THEN 0.5 MG INTO THE SKIN ONCE A WEEK FOR DIABETES   rosuvastatin  5 MG tablet Commonly known as: CRESTOR  TAKE 1 TABLET EVERY DAY FOR CHOLESTEROL What changed: See the new instructions.   simethicone  80 MG chewable tablet Commonly known as: MYLICON Chew 80 mg by mouth every 6 (six) hours as needed for flatulence.   Spiriva  Respimat 1.25  MCG/ACT Aers Generic drug: Tiotropium Bromide  Monohydrate Inhale 2 puffs into the lungs daily.   tamsulosin  0.4 MG Caps capsule Commonly known as: FLOMAX  TAKE 2 CAPSULES EVERY DAY FOR URINE FLOW What changed: See the new instructions.        Follow-up Information     Gretta Comer POUR, NP. Schedule an appointment as soon as possible for a visit in 10 day(s).   Specialty: Internal Medicine Contact information: [address] [place] [address] [phone]                Discharge Exam: Brian Mullen   07/26/24 2029  Weight: 85.7 kg   General exam: Alert, awake, oriented x 3 Respiratory system: Clear to auscultation. Respiratory effort normal. Cardiovascular system:RRR. No murmurs, rubs, gallops. Gastrointestinal system: Abdomen is nondistended, soft and nontender. No organomegaly or masses felt. Normal bowel sounds heard. Central nervous system: Alert and oriented. No focal neurological deficits. Extremities: No C/C/E, +pedal pulses Skin: No rashes, lesions or ulcers Psychiatry: Judgement and insight appear normal. Mood & affect appropriate.    Condition at discharge: good  The results of significant diagnostics from this hospitalization (including imaging, microbiology, ancillary and laboratory) are listed below for reference.   Imaging Studies: DG Chest Portable 1 View Result Date: 07/26/2024 EXAM: 1 VIEW XRAY OF THE CHEST 07/26/2024 11:05:00 PM COMPARISON: 04/15/2024 CLINICAL HISTORY: CP/'indigestion'. Chest  Pain, 'indigestion' feeling FINDINGS: LUNGS AND PLEURA: No focal pulmonary opacity. No pulmonary edema. No pleural effusion. No pneumothorax. HEART AND MEDIASTINUM: Stable cardiomediastinal silhouette. BONES AND SOFT TISSUES: No acute osseous abnormality. IMPRESSION: 1. No acute cardiopulmonary pathology. Electronically signed by: Norman Gatlin MD 07/26/2024 11:08 PM EDT RP Workstation: [id number]    Microbiology: Results for orders placed or  performed during the hospital encounter of 01/10/23  Resp panel by RT-PCR (RSV, Flu A&B, Covid) Anterior Nasal Swab     Status: Abnormal   Collection Time: 01/10/23  6:44 AM   Specimen: Anterior Nasal Swab  Result Value Ref Range Status   SARS Coronavirus 2 by RT PCR NEGATIVE NEGATIVE Final    Comment: (NOTE) SARS-CoV-2 target nucleic acids are NOT DETECTED.  The SARS-CoV-2 RNA is generally detectable in upper respiratory specimens during the acute phase of infection. The lowest concentration of SARS-CoV-2 viral copies this assay can detect is 138 copies/mL. A negative result does not preclude SARS-Cov-2 infection and should not be used as the sole basis for treatment or other patient management decisions. A negative result may occur with  improper specimen collection/handling, submission of specimen other than nasopharyngeal swab, presence of viral mutation(s) within the areas targeted by this assay, and inadequate number of viral copies(<138 copies/mL). A negative result must be combined with clinical observations, patient history, and epidemiological information. The expected result is Negative.  Fact Sheet for Patients:  BloggerCourse.com  Fact Sheet for Healthcare Providers:  SeriousBroker.it  This test is no t yet approved or cleared by the [place]  FDA and  has been authorized for detection and/or diagnosis of SARS-CoV-2 by FDA under an Emergency Use Authorization (EUA). This EUA will remain  in effect (meaning this test can be used) for the duration of the COVID-19 declaration under Section 564(b)(1) of the Act, 21 U.S.C.section 360bbb-3(b)(1), unless the authorization is terminated  or revoked sooner.       Influenza A by PCR POSITIVE (A) NEGATIVE Final   Influenza B by PCR NEGATIVE NEGATIVE Final    Comment: (NOTE) The Xpert Xpress SARS-CoV-2/FLU/RSV plus assay is intended as an aid in the diagnosis of influenza  from Nasopharyngeal swab specimens and should not be used as a sole basis for treatment. Nasal washings and aspirates are unacceptable for Xpert Xpress SARS-CoV-2/FLU/RSV testing.  Fact Sheet for Patients: BloggerCourse.com  Fact Sheet for Healthcare Providers: SeriousBroker.it  This test is not yet approved or cleared by the [place]  FDA and has been authorized for detection and/or diagnosis of SARS-CoV-2 by FDA under an Emergency Use Authorization (EUA). This EUA will remain in effect (meaning this test can be used) for the duration of the COVID-19 declaration under Section 564(b)(1) of the Act, 21 U.S.C. section 360bbb-3(b)(1), unless the authorization is terminated or revoked.     Resp Syncytial Virus by PCR NEGATIVE NEGATIVE Final    Comment: (NOTE) Fact Sheet for Patients: BloggerCourse.com  Fact Sheet for Healthcare Providers: SeriousBroker.it  This test is not yet approved or cleared by the [place]  FDA and has been authorized for detection and/or diagnosis of SARS-CoV-2 by FDA under an Emergency Use Authorization (EUA). This EUA will remain in effect (meaning this test can be used) for the duration of the COVID-19 declaration under Section 564(b)(1) of the Act, 21 U.S.C. section 360bbb-3(b)(1), unless the authorization is terminated or revoked.  Performed at [facility], [address], [place], [place] [address]     Labs: CBC: Recent Labs  Lab 07/26/24 2025 07/27/24  0509  WBC 18.5* 16.0*  HGB 12.9* 12.1*  HCT 40.1 37.4*  MCV 89.7 88.4  PLT 285 270   Basic Metabolic Panel: Recent Labs  Lab 07/26/24 2025 07/27/24 0509  NA 130* 133*  K 3.5 3.8  CL 92* 98  CO2 24 26  GLUCOSE 195* 137*  BUN 12 10  CREATININE 0.92 0.83  CALCIUM  8.8* 8.3*  MG  --  1.7  PHOS  --  2.6   Liver Function Tests: Recent Labs  Lab 07/26/24 2025  AST 14*  ALT  10  ALKPHOS 45  BILITOT 0.8  PROT 6.7  ALBUMIN 3.9   CBG: Recent Labs  Lab 07/26/24 2204 07/27/24 0725 07/27/24 1110 07/27/24 1617  GLUCAP 168* 128* 194* 114*    Discharge time spent: 35 minutes.  Signed: Eric Nunnery, MD [facility] 07/27/2024

## 2024-07-27 NOTE — Telephone Encounter (Signed)
-----   Message from [facility] sent at 07/27/2024  4:39 PM EDT ----- Regarding: event monitoring Patient with syncope and concerning symptoms for arrhythmia; please arrange 2 weeks of event monitoring.  Thanks

## 2024-07-27 NOTE — Plan of Care (Signed)
   Problem: Activity: Goal: Risk for activity intolerance will decrease Outcome: Progressing

## 2024-07-27 NOTE — H&P (Signed)
 History and Physical    Brian Mullen FMW:[id number] DOB: 07/08/1946 DOA: 07/26/2024  PCP: Gretta Comer POUR, NP   Patient coming from: Home   Chief Complaint:  Chief Complaint  Patient presents with   Loss of Consciousness    HPI:  Brian Mullen is a 78 y.o. male with hx of HTN, hx PVC on metoprolol , DM type 2, HLD, asthma, BPH, GERD, who presented after recurrent syncopal episodes. Reports having 3 clustered syncopal episodes. Had started yesterday evening, when going to put out the trash. Developed heart racing with sensation of intermittent skipped and hard beats. With associated lightheadedness. No chest pain. He would have this start, then sit down, and get up and would recur. When taking trash out had a syncopal episode. Then had additional 2 episodes coming back into the house. No similar events recently. No recent change in medications. Did have flu shot yesterday    Review of Systems:  ROS complete and negative except as marked above   Allergies  Allergen Reactions   Coconut (Cocos Nucifera) Anaphylaxis   Coconut Oil Anaphylaxis   Iodine Anaphylaxis   Shellfish Allergy Anaphylaxis   Shellfish-Derived Products Anaphylaxis   Amlodipine  Swelling and Cough    LE Swelling   Trelegy Ellipta  [Fluticasone -Umeclidin-Vilant] Other (See Comments)    Dizzy and vision issues - tried twice    Prior to Admission medications   Medication Sig Start Date End Date Taking? Authorizing Provider  albuterol  (VENTOLIN  HFA) 108 (90 Base) MCG/ACT inhaler INHALE 1 TO 2 PUFFS EVERY 4 TO 6 HOURS AS NEEDED 05/12/24   Clark, Katherine K, NP  Blood Glucose Monitoring Suppl DEVI 1 each by Does not apply route in the morning, at noon, and at bedtime. May substitute to any manufacturer covered by patient's insurance. 06/17/23   Gretta Comer POUR, NP  doxycycline  (VIBRA -TABS) 100 MG tablet Take 1 tablet (100 mg total) by mouth 2 (two) times daily. Patient not taking: Reported on 07/26/2024 05/22/24   Jimmy Charlie FERNS, MD  fexofenadine  (ALLEGRA ) 180 MG tablet Take 1 tablet (180 mg total) by mouth daily. For allergies 12/18/22   Clark, Katherine K, NP  fluticasone  (FLONASE ) 50 MCG/ACT nasal spray Place 1 spray into both nostrils 2 (two) times daily as needed for allergies or rhinitis. 06/15/24   Clark, Katherine K, NP  Glucose Blood (BLOOD GLUCOSE TEST STRIPS) STRP 1 each by In Vitro route in the morning, at noon, and at bedtime. May substitute to any manufacturer covered by patient's insurance. 06/17/23   Gretta Comer POUR, NP  Lancets Misc. MISC 1 each by Does not apply route in the morning, at noon, and at bedtime. May substitute to any manufacturer covered by patient's insurance. 06/17/23   Clark, Katherine K, NP  latanoprost  (XALATAN ) 0.005 % ophthalmic solution SMARTSIG:1 Drop(s) In Eye(s) Every Evening 11/21/21   [provider]  metFORMIN  (GLUCOPHAGE ) 500 MG tablet Take 1 tablet (500 mg total) by mouth 2 (two) times daily with a meal. for diabetes. 07/26/24   Clark, Katherine K, NP  metoprolol  succinate (TOPROL -XL) 25 MG 24 hr tablet TAKE 1 TABLET EVERY DAY FOR BLOOD PRESSURE 04/14/24   Clark, Katherine K, NP  montelukast  (SINGULAIR ) 10 MG tablet TAKE 1 TABLET EVERY DAY AT BEDTIME FOR ALLERGIES, ASTHMA 04/21/24   Clark, Katherine K, NP  omeprazole  (PRILOSEC) 40 MG capsule Take 1 capsule (40 mg total) by mouth 2 (two) times daily. for heartburn. 07/26/24   Gretta Comer POUR, NP  rosuvastatin  (CRESTOR ) 5 MG tablet  TAKE 1 TABLET EVERY DAY FOR CHOLESTEROL 07/24/24   Clark, Katherine K, NP  Semaglutide ,0.25 or 0.5MG /DOS, (OZEMPIC , 0.25 OR 0.5 MG/DOSE,) 2 MG/3ML SOPN INJECT 0.25 MG INTO THE SKIN ONCE A WEEK FOR 4 WEEKS THEN 0.5 MG INTO THE SKIN ONCE A WEEK FOR DIABETES 07/09/24   Clark, Katherine K, NP  tamsulosin  (FLOMAX ) 0.4 MG CAPS capsule TAKE 2 CAPSULES EVERY DAY FOR URINE FLOW 05/29/24   Clark, Katherine K, NP  Tiotropium Bromide  Monohydrate (SPIRIVA  RESPIMAT) 1.25 MCG/ACT AERS Inhale 2 puffs into the lungs  daily. 04/04/24   Gretta Comer POUR, NP  tiZANidine  (ZANAFLEX ) 4 MG tablet TAKE 1 TABLET AT BEDTIME AS NEEDED FOR MUSCLE SPASM(S) 04/12/24   Gretta Comer POUR, NP    Past Medical History:  Diagnosis Date   Allergy    Asthma    Cellulitis of left upper extremity 06/27/2021   Diabetes mellitus without complication (HCC)    GERD (gastroesophageal reflux disease)    Hypertension    Palpitations    a. 10/2019: Monitor showing rare PVC's and no significant arrhythmias.    Persistent cough for 3 weeks or longer 05/20/2021    Past Surgical History:  Procedure Laterality Date   CATARACT EXTRACTION Bilateral    02/23/2022, 03/28/2022   ELBOW SURGERY     KNEE SURGERY Right    TONSILLECTOMY       reports that he quit smoking about 49 years ago. His smoking use included cigarettes. He started smoking about 61 years ago. He has a 48 pack-year smoking history. He has never used smokeless tobacco. He reports that he does not currently use alcohol after a past usage of about 1.0 - 2.0 standard drink of alcohol per week. He reports that he does not use drugs.  Family History  Problem Relation Age of Onset   Pulmonary embolism Father 23       Deceased   Heart attack Mother 4       Deceased   Diabetes Maternal Grandmother    Heart attack Sister      Physical Exam: Vitals:   07/26/24 2100 07/26/24 2130 07/26/24 2200 07/26/24 2300  BP:  (!) 115/59 122/64 115/69  Pulse:  95 87   Resp:  15 16 18   Temp:      SpO2: 94% 98% 92% 92%  Weight:      Height:        Gen: Awake, alert, NAD   CV: Regular, normal S1, S2, no murmurs  Resp: Normal WOB, CTAB  Abd: Flat, normoactive, nontender MSK: Symmetric, no edema  Skin: No rashes or lesions to exposed skin  Neuro: Alert and interactive  Psych: euthymic, appropriate    Data review:   Labs reviewed, notable for:   Na 130  BG 195  Hs trop neg x 2  WBC 18  A1c 6.2%    Micro:  Results for orders placed or performed during the hospital  encounter of 01/10/23  Resp panel by RT-PCR (RSV, Flu A&B, Covid) Anterior Nasal Swab     Status: Abnormal   Collection Time: 01/10/23  6:44 AM   Specimen: Anterior Nasal Swab  Result Value Ref Range Status   SARS Coronavirus 2 by RT PCR NEGATIVE NEGATIVE Final    Comment: (NOTE) SARS-CoV-2 target nucleic acids are NOT DETECTED.  The SARS-CoV-2 RNA is generally detectable in upper respiratory specimens during the acute phase of infection. The lowest concentration of SARS-CoV-2 viral copies this assay can detect is 138 copies/mL. A negative  result does not preclude SARS-Cov-2 infection and should not be used as the sole basis for treatment or other patient management decisions. A negative result may occur with  improper specimen collection/handling, submission of specimen other than nasopharyngeal swab, presence of viral mutation(s) within the areas targeted by this assay, and inadequate number of viral copies(<138 copies/mL). A negative result must be combined with clinical observations, patient history, and epidemiological information. The expected result is Negative.  Fact Sheet for Patients:  BloggerCourse.com  Fact Sheet for Healthcare Providers:  SeriousBroker.it  This test is no t yet approved or cleared by the [place]  FDA and  has been authorized for detection and/or diagnosis of SARS-CoV-2 by FDA under an Emergency Use Authorization (EUA). This EUA will remain  in effect (meaning this test can be used) for the duration of the COVID-19 declaration under Section 564(b)(1) of the Act, 21 U.S.C.section 360bbb-3(b)(1), unless the authorization is terminated  or revoked sooner.       Influenza A by PCR POSITIVE (A) NEGATIVE Final   Influenza B by PCR NEGATIVE NEGATIVE Final    Comment: (NOTE) The Xpert Xpress SARS-CoV-2/FLU/RSV plus assay is intended as an aid in the diagnosis of influenza from Nasopharyngeal swab  specimens and should not be used as a sole basis for treatment. Nasal washings and aspirates are unacceptable for Xpert Xpress SARS-CoV-2/FLU/RSV testing.  Fact Sheet for Patients: BloggerCourse.com  Fact Sheet for Healthcare Providers: SeriousBroker.it  This test is not yet approved or cleared by the [place]  FDA and has been authorized for detection and/or diagnosis of SARS-CoV-2 by FDA under an Emergency Use Authorization (EUA). This EUA will remain in effect (meaning this test can be used) for the duration of the COVID-19 declaration under Section 564(b)(1) of the Act, 21 U.S.C. section 360bbb-3(b)(1), unless the authorization is terminated or revoked.     Resp Syncytial Virus by PCR NEGATIVE NEGATIVE Final    Comment: (NOTE) Fact Sheet for Patients: BloggerCourse.com  Fact Sheet for Healthcare Providers: SeriousBroker.it  This test is not yet approved or cleared by the [place]  FDA and has been authorized for detection and/or diagnosis of SARS-CoV-2 by FDA under an Emergency Use Authorization (EUA). This EUA will remain in effect (meaning this test can be used) for the duration of the COVID-19 declaration under Section 564(b)(1) of the Act, 21 U.S.C. section 360bbb-3(b)(1), unless the authorization is terminated or revoked.  Performed at [facility], [address] [place], [place] [address]     Imaging reviewed:  DG Chest Portable 1 View Result Date: 07/26/2024 EXAM: 1 VIEW XRAY OF THE CHEST 07/26/2024 11:05:00 PM COMPARISON: 04/15/2024 CLINICAL HISTORY: CP/'indigestion'. Chest Pain, 'indigestion' feeling FINDINGS: LUNGS AND PLEURA: No focal pulmonary opacity. No pulmonary edema. No pleural effusion. No pneumothorax. HEART AND MEDIASTINUM: Stable cardiomediastinal silhouette. BONES AND SOFT TISSUES: No acute osseous abnormality. IMPRESSION: 1. No acute  cardiopulmonary pathology. Electronically signed by: Norman Gatlin MD 07/26/2024 11:08 PM EDT RP Workstation: [id number]   Tele reviewed:  There is PVC and rare NSVT; will see if RN can attach strip   EKG:  Personally reveiwed, SR with small q inferiorly, no acute ischemic changes.   ED Course:   Treated with 1L LR.    Assessment/Plan:  78 y.o. male with hx HTN, hx PVC on metoprolol , DM type 2, HLD, asthma, BPH, GERD, who presented after recurrent syncopal episodes   Syncope, recurrent  P/w 3 syncopal episodes, associated with palpitations and skipped beats. Noted to have intermittent bradycardia into  40s in ED (although not captured on tele). EKG without significant structural change, small inferior q waves. Tele on floor with rare NSVT. Concern for possible cardiogenic given his report of palpitations, rare NSVT on monitor.   -- Hold home bblocker in setting of intermittent bradycardia. Would also avoid Tizanidine  prn (although no recent Rx)  -- Tele monitoring; may consider cardiac monitor at discharge  -- S/p 1 L IVF, give additional 500 cc  -- Serial orthostatics -- PT evaluation   Leukocytosis ? Source  WBC 18, without localizing symptoms. CXR is neg, LFT wnl. UA neg for infection. Suspect reactive in setting of recent flu vaccine.  -- Trend CBC   Chronic medical problems:  HTN: Not on antiHTN at time of admission.  PVCs: See above, holding metoprolol    DM type 2: Home regimen Metformin  and Ozempic , holding. Not currently requiring SSI. May want to temporarily hold GLP1 to avoid volume depletion until seen again outpatient.  HLD: Continue home Rosuvastatin   Asthma: Continue home singulair , spiriva , nebs prn  BPH: Continue Tamsulosin , change to nightly admin  GERD: Sub home PPI   Body mass index is 27.91 kg/m.    DVT prophylaxis:  Lovenox  Code Status:  Full Code Diet:  Diet Orders (From admission, onward)     Start     Ordered   07/26/24 2033  Diet NPO time  specified  Diet effective now        07/26/24 2033           Family Communication:  None   Consults:  None   Admission status:   Observation, Telemetry bed  Severity of Illness: The appropriate patient status for this patient is OBSERVATION. Observation status is judged to be reasonable and necessary in order to provide the required intensity of service to ensure the patient's safety. The patient's presenting symptoms, physical exam findings, and initial radiographic and laboratory data in the context of their medical condition is felt to place them at decreased risk for further clinical deterioration. Furthermore, it is anticipated that the patient will be medically stable for discharge from the hospital within 2 midnights of admission.    Dorn Dawson, MD [facility]  How to contact the [facility] Attending or Consulting provider 7A - 7P or covering provider during after hours 7P -7A, for this patient.  Check the care team in [facility] and look for a) attending/consulting [facility] provider listed and b) the [facility] team listed Log into [email or web address] and use [facility]'s universal password to access. If you do not have the password, please contact the hospital operator. Locate the [facility] provider you are looking for under [facility] and page to a number that you can be directly reached. If you still have difficulty reaching the provider, please page the [facility] (Director on Call) for the Hospitalists listed on amion for assistance.  07/27/2024, 12:26 AM

## 2024-07-28 DIAGNOSIS — R262 Difficulty in walking, not elsewhere classified: Secondary | ICD-10-CM | POA: Diagnosis not present

## 2024-07-28 DIAGNOSIS — M25562 Pain in left knee: Secondary | ICD-10-CM | POA: Diagnosis not present

## 2024-07-28 DIAGNOSIS — M25662 Stiffness of left knee, not elsewhere classified: Secondary | ICD-10-CM | POA: Diagnosis not present

## 2024-07-28 DIAGNOSIS — M1712 Unilateral primary osteoarthritis, left knee: Secondary | ICD-10-CM | POA: Diagnosis not present

## 2024-07-28 NOTE — Telephone Encounter (Signed)
Zio monitor ordered.   

## 2024-08-02 ENCOUNTER — Other Ambulatory Visit: Payer: Self-pay | Admitting: Radiology

## 2024-08-02 ENCOUNTER — Encounter: Payer: Self-pay | Admitting: Primary Care

## 2024-08-02 ENCOUNTER — Ambulatory Visit: Admitting: Primary Care

## 2024-08-02 VITALS — BP 118/56 | HR 76 | Temp 97.8°F | Ht 69.0 in | Wt 186.0 lb

## 2024-08-02 DIAGNOSIS — R42 Dizziness and giddiness: Secondary | ICD-10-CM | POA: Diagnosis not present

## 2024-08-02 DIAGNOSIS — E1165 Type 2 diabetes mellitus with hyperglycemia: Secondary | ICD-10-CM

## 2024-08-02 NOTE — Patient Instructions (Signed)
 Stop by the lab prior to leaving today. I will notify you of your results once received.   Continue 1/2 tablet of metoprolol  succinate medication for heart rate and blood pressure  Return the ZIO monitor after 14 days as instructed  Please notify me if no one contacts you regarding his results once received.  It was a pleasure to see you today!

## 2024-08-02 NOTE — Progress Notes (Signed)
 Subjective:    Patient ID: Brian Mullen, male    DOB: Jul 04, 1946, 78 y.o.   MRN: [id number]  Brian Mullen is a very pleasant 78 y.o. male with a history of hypertension, asthma, GERD, hyperlipidemia, type 2 diabetes, palpitations, anemia, syncope who presents today for hospital follow-up.  He presented to [facility], ED on 07/26/2024 for syncope and lightheadedness earlier that afternoon.  He had a total of 3 witnessed syncopal episodes per his wife.  He received his flu shot that day in our clinic.  Workup in the ED revealed leukocytosis without fevers or respiratory symptoms and hyponatremia.  Because of his medical history he was admitted for further monitoring.  During his hospital stay he received IV fluids, physical therapy, telemetry monitoring.  His metoprolol  was held given recent syncopal episode.  Urinalysis was negative for infection.  Chest x-ray was negative and EKG was negative for acute findings.  His symptoms improved and were suspected to be secondary to his influenza vaccine.  He was discharged home on 07/27/2024 with recommendations for 2 week event monitoring, repeat CBC.  Since his discharge home he's feeling mostly okay. He does feel dizzy and lightheaded with rest which lasts a few seconds to a minute. He feels his heart skip a beat with his lightheadedness.   He does have chronic, intermittent lightheadedness which is associated with the chronic blurred vision in his eye. He denies headaches. He has resumed his metoprolol  12.5 mg daily. He has noticed variations in his heart rate from 70-102. One night he noticed a HR of 140 that woke him from sleep.   BP Readings from Last 3 Encounters:  08/02/24 (!) 118/56  07/27/24 131/70  07/26/24 122/60      Review of Systems  Respiratory:  Negative for shortness of breath.   Cardiovascular:  Negative for chest pain.  Neurological:  Positive for dizziness and light-headedness. Negative for syncope and headaches.          Past Medical History:  Diagnosis Date   Allergy    Asthma    Cellulitis of left upper extremity 06/27/2021   Diabetes mellitus without complication (HCC)    GERD (gastroesophageal reflux disease)    Hypertension    Palpitations    a. 10/2019: Monitor showing rare PVC's and no significant arrhythmias.    Persistent cough for 3 weeks or longer 05/20/2021    Social History   Socioeconomic History   Marital status: Widowed    Spouse name: Not on file   Number of children: Not on file   Years of education: Not on file   Highest education level: 8th grade  Occupational History   Not on file  Tobacco Use   Smoking status: Former    Current packs/day: 0.00    Average packs/day: 4.0 packs/day for 12.0 years (48.0 ttl pk-yrs)    Types: Cigarettes    Start date: 42    Quit date: 57    Years since quitting: 49.7   Smokeless tobacco: Never   Tobacco comments:    used to smoke Lucky's  Vaping Use   Vaping status: Never Used  Substance and Sexual Activity   Alcohol use: Not Currently    Alcohol/week: 1.0 - 2.0 standard drink of alcohol    Types: 1 - 2 Standard drinks or equivalent per week    Comment: occ   Drug use: No   Sexual activity: Not on file  Other Topics Concern   Not on file  Social History  Narrative   Married.   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   Enjoys working on [employer], yard work, gardening.   Right handed    Social Drivers of Health   Financial Resource Strain: Low Risk  (05/22/2024)   Overall Financial Resource Strain (CARDIA)    Difficulty of Paying Living Expenses: Not hard at all  Food Insecurity: No Food Insecurity (07/27/2024)   Hunger Vital Sign    Worried About Running Out of Food in the Last Year: Never true    Ran Out of Food in the Last Year: Never true  Transportation Needs: No Transportation Needs (07/27/2024)   PRAPARE - Administrator, Civil Service (Medical): No    Lack of Transportation  (Non-Medical): No  Physical Activity: Inactive (05/22/2024)   Exercise Vital Sign    Days of Exercise per Week: 0 days    Minutes of Exercise per Session: Not on file  Stress: No Stress Concern Present (05/22/2024)   [employer] of Occupational Health - Occupational Stress Questionnaire    Feeling of Stress: Not at all  Social Connections: Unknown (07/27/2024)   Social Connection and Isolation Panel    Frequency of Communication with Friends and Family: Not on file    Frequency of Social Gatherings with Friends and Family: Not on file    Attends Religious Services: Not on file    Active Member of Clubs or Organizations: Not on file    Attends Banker Meetings: Not on file    Marital Status: Widowed  Recent Concern: Social Connections - Moderately Isolated (05/22/2024)   Social Connection and Isolation Panel    Frequency of Communication with Friends and Family: Once a week    Frequency of Social Gatherings with Friends and Family: Once a week    Attends Religious Services: 1 to 4 times per year    Active Member of [employer] or Organizations: No    Attends Engineer, structural: Not on file    Marital Status: Living with partner  Intimate Partner Violence: Not At Risk (07/27/2024)   Humiliation, Afraid, Rape, and Kick questionnaire    Fear of Current or Ex-Partner: No    Emotionally Abused: No    Physically Abused: No    Sexually Abused: No    Past Surgical History:  Procedure Laterality Date   CATARACT EXTRACTION Bilateral    02/23/2022, 03/28/2022   ELBOW SURGERY     KNEE SURGERY Right    TONSILLECTOMY      Family History  Problem Relation Age of Onset   Pulmonary embolism Father 19       Deceased   Heart attack Mother 6       Deceased   Diabetes Maternal Grandmother    Heart attack Sister     Allergies  Allergen Reactions   Breo Ellipta  [Fluticasone  Furoate-Vilanterol] Anaphylaxis   Coconut (Cocos Nucifera) Anaphylaxis   Coconut Oil Anaphylaxis    Iodine Anaphylaxis   Shellfish Allergy Anaphylaxis   Shellfish-Derived Products Anaphylaxis   Amlodipine  Swelling and Cough    LE Swelling   Trelegy Ellipta  [Fluticasone -Umeclidin-Vilant] Other (See Comments)    Dizzy and vision issues - tried twice    Current Outpatient Medications on File Prior to Visit  Medication Sig Dispense Refill   albuterol  (VENTOLIN  HFA) 108 (90 Base) MCG/ACT inhaler INHALE 1 TO 2 PUFFS EVERY 4 TO 6 HOURS AS NEEDED (Patient taking differently: Inhale 1-2 puffs into the lungs  every 4 (four) hours as needed for wheezing or shortness of breath.) 1 each 0   Blood Glucose Monitoring Suppl DEVI 1 each by Does not apply route in the morning, at noon, and at bedtime. May substitute to any manufacturer covered by patient's insurance. 1 each 0   fexofenadine  (ALLEGRA ) 180 MG tablet Take 1 tablet (180 mg total) by mouth daily. For allergies 90 tablet 1   fluticasone  (FLONASE ) 50 MCG/ACT nasal spray Place 1 spray into both nostrils 2 (two) times daily as needed for allergies or rhinitis. (Patient taking differently: Place 1 spray into both nostrils daily.) 48 g 0   Glucose Blood (BLOOD GLUCOSE TEST STRIPS) STRP 1 each by In Vitro route in the morning, at noon, and at bedtime. May substitute to any manufacturer covered by patient's insurance. 300 strip 1   Lancets Misc. MISC 1 each by Does not apply route in the morning, at noon, and at bedtime. May substitute to any manufacturer covered by patient's insurance. 300 each 1   latanoprost  (XALATAN ) 0.005 % ophthalmic solution Place 1 drop into both eyes at bedtime.     metFORMIN  (GLUCOPHAGE ) 500 MG tablet Take 1 tablet (500 mg total) by mouth 2 (two) times daily with a meal. for diabetes. 180 tablet 1   metoprolol  succinate (TOPROL -XL) 25 MG 24 hr tablet Take 0.5 tablets (12.5 mg total) by mouth daily. 90 tablet 3   montelukast  (SINGULAIR ) 10 MG tablet TAKE 1 TABLET EVERY DAY AT BEDTIME FOR ALLERGIES, ASTHMA (Patient taking  differently: Take 10 mg by mouth at bedtime. TAKE 1 TABLET EVERY DAY AT BEDTIME FOR ALLERGIES, ASTHMA) 90 tablet 2   omeprazole  (PRILOSEC) 40 MG capsule Take 1 capsule (40 mg total) by mouth 2 (two) times daily. for heartburn. (Patient taking differently: Take 40 mg by mouth 2 (two) times daily. Heartburn) 180 capsule 1   rosuvastatin  (CRESTOR ) 5 MG tablet TAKE 1 TABLET EVERY DAY FOR CHOLESTEROL (Patient taking differently: Take 5 mg by mouth daily.) 90 tablet 1   Semaglutide ,0.25 or 0.5MG /DOS, (OZEMPIC , 0.25 OR 0.5 MG/DOSE,) 2 MG/3ML SOPN INJECT 0.25 MG INTO THE SKIN ONCE A WEEK FOR 4 WEEKS THEN 0.5 MG INTO THE SKIN ONCE A WEEK FOR DIABETES 3 mL 0   simethicone  (MYLICON) 80 MG chewable tablet Chew 80 mg by mouth every 6 (six) hours as needed for flatulence.     tamsulosin  (FLOMAX ) 0.4 MG CAPS capsule TAKE 2 CAPSULES EVERY DAY FOR URINE FLOW (Patient taking differently: Take 0.8 mg by mouth daily.) 180 capsule 2   Tiotropium Bromide  Monohydrate (SPIRIVA  RESPIMAT) 1.25 MCG/ACT AERS Inhale 2 puffs into the lungs daily. 4 g 11   No current facility-administered medications on file prior to visit.    BP (!) 118/56   Pulse 76   Temp 97.8 F (36.6 C) (Temporal)   Ht 5' 9 (1.753 m)   Wt 186 lb (84.4 kg)   SpO2 95%   BMI 27.47 kg/m  Objective:   Physical Exam Cardiovascular:     Rate and Rhythm: Normal rate and regular rhythm.  Pulmonary:     Effort: Pulmonary effort is normal.     Breath sounds: Normal breath sounds.  Musculoskeletal:     Cervical back: Neck supple.  Skin:    General: Skin is warm and dry.  Neurological:     Mental Status: He is alert and oriented to person, place, and time.  Psychiatric:        Mood and Affect: Mood normal.  Physical Exam        Assessment & Plan:  Lightheadedness Assessment & Plan: With recent hospitalization for syncope. Hospital notes, labs, imaging reviewed.  Applied ZIO monitor to patient today.  We discussed instructions for use  and return. CBC ordered and pending.  Continue metoprolol  succinate 12.5 mg daily for now.  Consider discontinuation if warranted  Return precautions provided.  Orders: -     CBC    Assessment and Plan Assessment & Plan         Comer MARLA Gaskins, NP    History of Present Illness

## 2024-08-02 NOTE — Assessment & Plan Note (Signed)
 With recent hospitalization for syncope. Hospital notes, labs, imaging reviewed.  Applied ZIO monitor to patient today.  We discussed instructions for use and return. CBC ordered and pending.  Continue metoprolol  succinate 12.5 mg daily for now.  Consider discontinuation if warranted  Return precautions provided.

## 2024-08-03 ENCOUNTER — Ambulatory Visit: Payer: Self-pay | Admitting: Primary Care

## 2024-08-03 DIAGNOSIS — R55 Syncope and collapse: Secondary | ICD-10-CM | POA: Diagnosis not present

## 2024-08-03 LAB — MICROALBUMIN / CREATININE URINE RATIO
Creatinine,U: 79 mg/dL
Microalb Creat Ratio: UNDETERMINED mg/g (ref 0.0–30.0)
Microalb, Ur: <0.7 mg/dL

## 2024-08-03 LAB — CBC
HCT: 37.5 % — ABNORMAL LOW (ref 39.0–52.0)
Hemoglobin: 12.4 g/dL — ABNORMAL LOW (ref 13.0–17.0)
MCHC: 33.1 g/dL (ref 30.0–36.0)
MCV: 86.6 fl (ref 78.0–100.0)
Platelets: 322 K/uL (ref 150.0–400.0)
RBC: 4.33 Mil/uL (ref 4.22–5.81)
RDW: 13.9 % (ref 11.5–15.5)
WBC: 9.2 K/uL (ref 4.0–10.5)

## 2024-08-08 DIAGNOSIS — R262 Difficulty in walking, not elsewhere classified: Secondary | ICD-10-CM | POA: Diagnosis not present

## 2024-08-08 DIAGNOSIS — M1712 Unilateral primary osteoarthritis, left knee: Secondary | ICD-10-CM | POA: Diagnosis not present

## 2024-08-08 DIAGNOSIS — M25562 Pain in left knee: Secondary | ICD-10-CM | POA: Diagnosis not present

## 2024-08-08 DIAGNOSIS — M25662 Stiffness of left knee, not elsewhere classified: Secondary | ICD-10-CM | POA: Diagnosis not present

## 2024-08-10 ENCOUNTER — Other Ambulatory Visit: Payer: Self-pay | Admitting: Primary Care

## 2024-08-10 DIAGNOSIS — E1165 Type 2 diabetes mellitus with hyperglycemia: Secondary | ICD-10-CM

## 2024-08-15 DIAGNOSIS — M25561 Pain in right knee: Secondary | ICD-10-CM | POA: Diagnosis not present

## 2024-08-15 DIAGNOSIS — M170 Bilateral primary osteoarthritis of knee: Secondary | ICD-10-CM | POA: Diagnosis not present

## 2024-08-15 DIAGNOSIS — M25562 Pain in left knee: Secondary | ICD-10-CM | POA: Diagnosis not present

## 2024-08-15 DIAGNOSIS — R262 Difficulty in walking, not elsewhere classified: Secondary | ICD-10-CM | POA: Diagnosis not present

## 2024-08-16 DIAGNOSIS — M9902 Segmental and somatic dysfunction of thoracic region: Secondary | ICD-10-CM | POA: Diagnosis not present

## 2024-08-16 DIAGNOSIS — M9901 Segmental and somatic dysfunction of cervical region: Secondary | ICD-10-CM | POA: Diagnosis not present

## 2024-08-16 DIAGNOSIS — M62830 Muscle spasm of back: Secondary | ICD-10-CM | POA: Diagnosis not present

## 2024-08-16 DIAGNOSIS — M9903 Segmental and somatic dysfunction of lumbar region: Secondary | ICD-10-CM | POA: Diagnosis not present

## 2024-08-16 DIAGNOSIS — M542 Cervicalgia: Secondary | ICD-10-CM | POA: Diagnosis not present

## 2024-08-16 DIAGNOSIS — M546 Pain in thoracic spine: Secondary | ICD-10-CM | POA: Diagnosis not present

## 2024-08-22 DIAGNOSIS — M25562 Pain in left knee: Secondary | ICD-10-CM | POA: Diagnosis not present

## 2024-08-22 DIAGNOSIS — R262 Difficulty in walking, not elsewhere classified: Secondary | ICD-10-CM | POA: Diagnosis not present

## 2024-08-22 DIAGNOSIS — M25561 Pain in right knee: Secondary | ICD-10-CM | POA: Diagnosis not present

## 2024-08-22 DIAGNOSIS — M170 Bilateral primary osteoarthritis of knee: Secondary | ICD-10-CM | POA: Diagnosis not present

## 2024-08-25 ENCOUNTER — Ambulatory Visit: Payer: Self-pay | Admitting: Internal Medicine

## 2024-08-25 DIAGNOSIS — R55 Syncope and collapse: Secondary | ICD-10-CM | POA: Diagnosis not present

## 2024-08-28 ENCOUNTER — Other Ambulatory Visit: Payer: Self-pay | Admitting: Primary Care

## 2024-08-28 DIAGNOSIS — H938X3 Other specified disorders of ear, bilateral: Secondary | ICD-10-CM

## 2024-08-29 ENCOUNTER — Telehealth: Payer: Self-pay | Admitting: Internal Medicine

## 2024-08-29 DIAGNOSIS — M25561 Pain in right knee: Secondary | ICD-10-CM | POA: Diagnosis not present

## 2024-08-29 DIAGNOSIS — M25562 Pain in left knee: Secondary | ICD-10-CM | POA: Diagnosis not present

## 2024-08-29 DIAGNOSIS — M170 Bilateral primary osteoarthritis of knee: Secondary | ICD-10-CM | POA: Diagnosis not present

## 2024-08-29 DIAGNOSIS — R262 Difficulty in walking, not elsewhere classified: Secondary | ICD-10-CM | POA: Diagnosis not present

## 2024-08-29 NOTE — Telephone Encounter (Signed)
 Spoke to patient and informed pt/fiancee of testing results. Patient stated that he feels like his heart is still skipping beats and does not like the feeling he gets when this happens. Pt stated that provider in [place] had him on a medication to help with his symptoms. Pt stated medication worked for him. Did not remember what medication it was. Pt is asking if he can be put on medication to help symptoms.   Please advise.

## 2024-08-29 NOTE — Telephone Encounter (Signed)
 Pt requesting a c/b to discuss results.

## 2024-08-29 NOTE — Telephone Encounter (Signed)
 The patient has been notified of the result and verbalized understanding.  All questions (if any) were answered. Rosina JAYSON Cornea, CMA 08/29/2024 8:19 AM

## 2024-08-30 DIAGNOSIS — M9902 Segmental and somatic dysfunction of thoracic region: Secondary | ICD-10-CM | POA: Diagnosis not present

## 2024-08-30 DIAGNOSIS — M62830 Muscle spasm of back: Secondary | ICD-10-CM | POA: Diagnosis not present

## 2024-08-30 DIAGNOSIS — M9901 Segmental and somatic dysfunction of cervical region: Secondary | ICD-10-CM | POA: Diagnosis not present

## 2024-08-30 DIAGNOSIS — M542 Cervicalgia: Secondary | ICD-10-CM | POA: Diagnosis not present

## 2024-08-30 DIAGNOSIS — M9903 Segmental and somatic dysfunction of lumbar region: Secondary | ICD-10-CM | POA: Diagnosis not present

## 2024-08-30 DIAGNOSIS — M546 Pain in thoracic spine: Secondary | ICD-10-CM | POA: Diagnosis not present

## 2024-09-05 DIAGNOSIS — M25661 Stiffness of right knee, not elsewhere classified: Secondary | ICD-10-CM | POA: Diagnosis not present

## 2024-09-05 DIAGNOSIS — M1711 Unilateral primary osteoarthritis, right knee: Secondary | ICD-10-CM | POA: Diagnosis not present

## 2024-09-05 DIAGNOSIS — M25561 Pain in right knee: Secondary | ICD-10-CM | POA: Diagnosis not present

## 2024-09-05 DIAGNOSIS — R262 Difficulty in walking, not elsewhere classified: Secondary | ICD-10-CM | POA: Diagnosis not present

## 2024-09-05 MED ORDER — METOPROLOL TARTRATE 25 MG PO TABS: 25.0000 mg | ORAL_TABLET | Freq: Two times a day (BID) | ORAL | 3 refills | Status: DC

## 2024-09-05 NOTE — Telephone Encounter (Signed)
 Patient notified and verbalized agreement with switch in medication. Pt will call office back to schedule 54m f/u as he did not have his appointment book with him.

## 2024-09-06 DIAGNOSIS — M9901 Segmental and somatic dysfunction of cervical region: Secondary | ICD-10-CM | POA: Diagnosis not present

## 2024-09-06 DIAGNOSIS — M9902 Segmental and somatic dysfunction of thoracic region: Secondary | ICD-10-CM | POA: Diagnosis not present

## 2024-09-06 DIAGNOSIS — M546 Pain in thoracic spine: Secondary | ICD-10-CM | POA: Diagnosis not present

## 2024-09-06 DIAGNOSIS — M62830 Muscle spasm of back: Secondary | ICD-10-CM | POA: Diagnosis not present

## 2024-09-06 DIAGNOSIS — M9903 Segmental and somatic dysfunction of lumbar region: Secondary | ICD-10-CM | POA: Diagnosis not present

## 2024-09-06 DIAGNOSIS — M542 Cervicalgia: Secondary | ICD-10-CM | POA: Diagnosis not present

## 2024-09-12 DIAGNOSIS — R262 Difficulty in walking, not elsewhere classified: Secondary | ICD-10-CM | POA: Diagnosis not present

## 2024-09-12 DIAGNOSIS — M25661 Stiffness of right knee, not elsewhere classified: Secondary | ICD-10-CM | POA: Diagnosis not present

## 2024-09-12 DIAGNOSIS — M25561 Pain in right knee: Secondary | ICD-10-CM | POA: Diagnosis not present

## 2024-09-12 DIAGNOSIS — M1711 Unilateral primary osteoarthritis, right knee: Secondary | ICD-10-CM | POA: Diagnosis not present

## 2024-09-19 ENCOUNTER — Other Ambulatory Visit: Payer: Self-pay | Admitting: Emergency Medicine

## 2024-09-20 ENCOUNTER — Other Ambulatory Visit: Payer: Self-pay | Admitting: Primary Care

## 2024-09-20 DIAGNOSIS — E1165 Type 2 diabetes mellitus with hyperglycemia: Secondary | ICD-10-CM

## 2024-09-20 MED ORDER — BLOOD GLUCOSE TEST VI STRP: 1.0000 | ORAL_STRIP | Freq: Three times a day (TID) | 1 refills | Status: AC

## 2024-09-20 NOTE — Telephone Encounter (Unsigned)
 Copied from CRM [id number]. Topic: Clinical - Medication Refill >> Sep 20, 2024 11:44 AM Dedra B wrote: Medication: Glucose Blood (BLOOD GLUCOSE TEST STRIPS) STRP   Has the patient contacted their pharmacy? Yes, pharmacy said they have been sending a refill request with no response   This is the patient's preferred pharmacy:  [facility] - [place], [place] - [address] [place] [address] Phone: [phone] Fax: [phone]  Is this the correct pharmacy for this prescription? Yes  Has the prescription been filled recently? No  Is the patient out of the medication? Yes  Has the patient been seen for an appointment in the last year OR does the patient have an upcoming appointment? Yes  Can we respond through MyChart? Yes  Agent: Please be advised that Rx refills may take up to 3 business days. We ask that you follow-up with your pharmacy.

## 2024-09-25 DIAGNOSIS — M2530 Other instability, unspecified joint: Secondary | ICD-10-CM | POA: Diagnosis not present

## 2024-09-25 DIAGNOSIS — Z87892 Personal history of anaphylaxis: Secondary | ICD-10-CM | POA: Diagnosis not present

## 2024-09-25 DIAGNOSIS — K219 Gastro-esophageal reflux disease without esophagitis: Secondary | ICD-10-CM | POA: Diagnosis not present

## 2024-09-25 DIAGNOSIS — Z91041 Radiographic dye allergy status: Secondary | ICD-10-CM | POA: Diagnosis not present

## 2024-09-25 DIAGNOSIS — Z7985 Long-term (current) use of injectable non-insulin antidiabetic drugs: Secondary | ICD-10-CM | POA: Diagnosis not present

## 2024-09-25 DIAGNOSIS — I129 Hypertensive chronic kidney disease with stage 1 through stage 4 chronic kidney disease, or unspecified chronic kidney disease: Secondary | ICD-10-CM | POA: Diagnosis not present

## 2024-09-25 DIAGNOSIS — Z7951 Long term (current) use of inhaled steroids: Secondary | ICD-10-CM | POA: Diagnosis not present

## 2024-09-25 DIAGNOSIS — N182 Chronic kidney disease, stage 2 (mild): Secondary | ICD-10-CM | POA: Diagnosis not present

## 2024-09-25 DIAGNOSIS — J309 Allergic rhinitis, unspecified: Secondary | ICD-10-CM | POA: Diagnosis not present

## 2024-09-25 DIAGNOSIS — H409 Unspecified glaucoma: Secondary | ICD-10-CM | POA: Diagnosis not present

## 2024-09-25 DIAGNOSIS — J4489 Other specified chronic obstructive pulmonary disease: Secondary | ICD-10-CM | POA: Diagnosis not present

## 2024-09-25 DIAGNOSIS — E785 Hyperlipidemia, unspecified: Secondary | ICD-10-CM | POA: Diagnosis not present

## 2024-09-25 DIAGNOSIS — Z91018 Allergy to other foods: Secondary | ICD-10-CM | POA: Diagnosis not present

## 2024-09-25 DIAGNOSIS — Z91013 Allergy to seafood: Secondary | ICD-10-CM | POA: Diagnosis not present

## 2024-09-25 DIAGNOSIS — N400 Enlarged prostate without lower urinary tract symptoms: Secondary | ICD-10-CM | POA: Diagnosis not present

## 2024-09-25 DIAGNOSIS — M62838 Other muscle spasm: Secondary | ICD-10-CM | POA: Diagnosis not present

## 2024-09-25 DIAGNOSIS — E1122 Type 2 diabetes mellitus with diabetic chronic kidney disease: Secondary | ICD-10-CM | POA: Diagnosis not present

## 2024-09-25 DIAGNOSIS — Z888 Allergy status to other drugs, medicaments and biological substances status: Secondary | ICD-10-CM | POA: Diagnosis not present

## 2024-10-04 DIAGNOSIS — M9903 Segmental and somatic dysfunction of lumbar region: Secondary | ICD-10-CM | POA: Diagnosis not present

## 2024-10-04 DIAGNOSIS — M546 Pain in thoracic spine: Secondary | ICD-10-CM | POA: Diagnosis not present

## 2024-10-04 DIAGNOSIS — M542 Cervicalgia: Secondary | ICD-10-CM | POA: Diagnosis not present

## 2024-10-04 DIAGNOSIS — M62830 Muscle spasm of back: Secondary | ICD-10-CM | POA: Diagnosis not present

## 2024-10-04 DIAGNOSIS — M9902 Segmental and somatic dysfunction of thoracic region: Secondary | ICD-10-CM | POA: Diagnosis not present

## 2024-10-04 DIAGNOSIS — M9901 Segmental and somatic dysfunction of cervical region: Secondary | ICD-10-CM | POA: Diagnosis not present

## 2024-10-05 ENCOUNTER — Ambulatory Visit: Admitting: Primary Care

## 2024-10-17 ENCOUNTER — Ambulatory Visit: Admitting: Primary Care

## 2024-10-18 DIAGNOSIS — M62830 Muscle spasm of back: Secondary | ICD-10-CM | POA: Diagnosis not present

## 2024-10-18 DIAGNOSIS — M9902 Segmental and somatic dysfunction of thoracic region: Secondary | ICD-10-CM | POA: Diagnosis not present

## 2024-10-18 DIAGNOSIS — M9901 Segmental and somatic dysfunction of cervical region: Secondary | ICD-10-CM | POA: Diagnosis not present

## 2024-10-18 DIAGNOSIS — M542 Cervicalgia: Secondary | ICD-10-CM | POA: Diagnosis not present

## 2024-10-18 DIAGNOSIS — M546 Pain in thoracic spine: Secondary | ICD-10-CM | POA: Diagnosis not present

## 2024-10-18 DIAGNOSIS — M9903 Segmental and somatic dysfunction of lumbar region: Secondary | ICD-10-CM | POA: Diagnosis not present

## 2024-10-30 ENCOUNTER — Other Ambulatory Visit: Payer: Self-pay

## 2024-10-31 ENCOUNTER — Encounter: Payer: Self-pay | Admitting: Primary Care

## 2024-10-31 ENCOUNTER — Ambulatory Visit: Admitting: Primary Care

## 2024-10-31 ENCOUNTER — Ambulatory Visit: Payer: Self-pay | Admitting: Primary Care

## 2024-10-31 VITALS — BP 124/68 | HR 76 | Temp 97.8°F | Ht 69.0 in | Wt 181.5 lb

## 2024-10-31 DIAGNOSIS — Z7984 Long term (current) use of oral hypoglycemic drugs: Secondary | ICD-10-CM | POA: Diagnosis not present

## 2024-10-31 DIAGNOSIS — I493 Ventricular premature depolarization: Secondary | ICD-10-CM

## 2024-10-31 DIAGNOSIS — Z7985 Long-term (current) use of injectable non-insulin antidiabetic drugs: Secondary | ICD-10-CM | POA: Diagnosis not present

## 2024-10-31 DIAGNOSIS — E1165 Type 2 diabetes mellitus with hyperglycemia: Secondary | ICD-10-CM

## 2024-10-31 LAB — POCT GLYCOSYLATED HEMOGLOBIN (HGB A1C): Hemoglobin A1C: 6.2 % — AB (ref 4.0–5.6)

## 2024-10-31 NOTE — Assessment & Plan Note (Signed)
 Controlled.  Continue metoprolol  to tartrate 25 mg once daily.  We discussed the ability to add a second dose during the day if needed for

## 2024-10-31 NOTE — Assessment & Plan Note (Signed)
 Improved with A1c of 6.2 today.  Continue Ozempic  at 0.5 mg weekly, metformin  500 mg twice daily. Foot exam today.  Follow-up in 6 months.

## 2024-10-31 NOTE — Progress Notes (Signed)
 Subjective:    Patient ID: Brian Mullen, male    DOB: 02/10/1946, 78 y.o.   MRN: [id number]  Brian Mullen is a very pleasant 78 y.o. male with a history of type 2 diabetes, hypertension, asthma, hyperlipidemia who presents today for follow up of diabetes.  1) Type 2 Diabetes:  Current medications include: Metformin  500 mg twice daily, Ozempic  0.5 mg weekly.   He is checking his blood glucose 1 times daily and is getting readings of:  AM fasting: 90s to mid 100s  Last A1C: 6.2 in September 2025, 6.2 today Last Eye Exam: Due Last Foot Exam: Due Pneumonia Vaccination: 2020 Urine Microalbumin: Up-to-date Statin: Rosuvastatin   Dietary changes since last visit: Reduced appetite, smaller portion sizes.    Exercise: None  Wt Readings from Last 3 Encounters:  10/31/24 181 lb 8 oz (82.3 kg)  08/02/24 186 lb (84.4 kg)  07/26/24 189 lb (85.7 kg)      2) Palpitations/Skipped beats: Currently managed on metoprolol  tartrate 25 mg BID for which was initiated in October 2025 per cardiology. Previously managed on metoprolol  succinate and has been taking metoprolol  tartrate once daily.  He questions today if it is okay to take metoprolol  to tartrate once daily as it is effective for symptom management.  BP Readings from Last 3 Encounters:  10/31/24 124/68  08/02/24 (!) 118/56  07/27/24 131/70       Review of Systems  Respiratory:  Negative for shortness of breath.   Cardiovascular:  Negative for chest pain.  Gastrointestinal:  Negative for abdominal pain and nausea.         Past Medical History:  Diagnosis Date   Allergy    Asthma    Cellulitis of left upper extremity 06/27/2021   Diabetes mellitus without complication (HCC)    GERD (gastroesophageal reflux disease)    Hypertension    Palpitations    a. 10/2019: Monitor showing rare PVC's and no significant arrhythmias.    Persistent cough for 3 weeks or longer 05/20/2021    Social History   Socioeconomic History    Marital status: Widowed    Spouse name: Not on file   Number of children: Not on file   Years of education: Not on file   Highest education level: 8th grade  Occupational History   Not on file  Tobacco Use   Smoking status: Former    Current packs/day: 0.00    Average packs/day: 4.0 packs/day for 12.0 years (48.0 ttl pk-yrs)    Types: Cigarettes    Start date: 73    Quit date: 64    Years since quitting: 49.9   Smokeless tobacco: Never   Tobacco comments:    used to smoke Lucky's  Vaping Use   Vaping status: Never Used  Substance and Sexual Activity   Alcohol use: Not Currently    Alcohol/week: 1.0 - 2.0 standard drink of alcohol    Types: 1 - 2 Standard drinks or equivalent per week    Comment: occ   Drug use: No   Sexual activity: Not on file  Other Topics Concern   Not on file  Social History Narrative   Married.   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   Enjoys working on [employer], yard work, gardening.   Right handed    Social Drivers of Health   Tobacco Use: Medium Risk (10/31/2024)   Patient History    Smoking Tobacco Use: Former    Smokeless  Tobacco Use: Never    Passive Exposure: Not on file  Financial Resource Strain: Low Risk (10/27/2024)   Overall Financial Resource Strain (CARDIA)    Difficulty of Paying Living Expenses: Not hard at all  Food Insecurity: No Food Insecurity (10/27/2024)   Epic    Worried About Programme Researcher, Broadcasting/film/video in the Last Year: Never true    Ran Out of Food in the Last Year: Never true  Transportation Needs: No Transportation Needs (10/27/2024)   Epic    Lack of Transportation (Medical): No    Lack of Transportation (Non-Medical): No  Physical Activity: Inactive (10/27/2024)   Exercise Vital Sign    Days of Exercise per Week: 0 days    Minutes of Exercise per Session: Not on file  Stress: No Stress Concern Present (10/27/2024)   [employer] of Occupational Health - Occupational Stress  Questionnaire    Feeling of Stress: Not at all  Social Connections: Moderately Isolated (10/27/2024)   Social Connection and Isolation Panel    Frequency of Communication with Friends and Family: Once a week    Frequency of Social Gatherings with Friends and Family: Once a week    Attends Religious Services: 1 to 4 times per year    Active Member of [employer] or Organizations: No    Attends Banker Meetings: Not on file    Marital Status: Living with partner  Intimate Partner Violence: Not At Risk (07/27/2024)   Epic    Fear of Current or Ex-Partner: No    Emotionally Abused: No    Physically Abused: No    Sexually Abused: No  Depression (PHQ2-9): Low Risk (10/31/2024)   Depression (PHQ2-9)    PHQ-2 Score: 0  Recent Concern: Depression (PHQ2-9) - Medium Risk (08/02/2024)   Depression (PHQ2-9)    PHQ-2 Score: 6  Alcohol Screen: Low Risk (01/14/2024)   Alcohol Screen    Last Alcohol Screening Score (AUDIT): 0  Housing: Low Risk (10/27/2024)   Epic    Unable to Pay for Housing in the Last Year: No    Number of Times Moved in the Last Year: 0    Homeless in the Last Year: No  Utilities: Not At Risk (07/27/2024)   Epic    Threatened with loss of utilities: No  Health Literacy: Adequate Health Literacy (01/14/2024)   B1300 Health Literacy    Frequency of need for help with medical instructions: Never    Past Surgical History:  Procedure Laterality Date   CATARACT EXTRACTION Bilateral    02/23/2022, 03/28/2022   ELBOW SURGERY     KNEE SURGERY Right    TONSILLECTOMY      Family History  Problem Relation Age of Onset   Pulmonary embolism Father 55       Deceased   Heart attack Mother 69       Deceased   Diabetes Maternal Grandmother    Heart attack Sister     Allergies[1]  Medications Ordered Prior to Encounter[2]  BP 124/68   Pulse 76   Temp 97.8 F (36.6 C) (Oral)   Ht 5' 9 (1.753 m)   Wt 181 lb 8 oz (82.3 kg)   SpO2 96%   BMI 26.80 kg/m  Objective:    Physical Exam Cardiovascular:     Rate and Rhythm: Normal rate and regular rhythm.  Pulmonary:     Effort: Pulmonary effort is normal.     Breath sounds: Normal breath sounds.  Musculoskeletal:     Cervical  back: Neck supple.  Skin:    General: Skin is warm and dry.  Neurological:     Mental Status: He is alert and oriented to person, place, and time.  Psychiatric:        Mood and Affect: Mood normal.     Physical Exam        Assessment & Plan:  Type 2 diabetes mellitus with hyperglycemia, without long-term current use of insulin  (HCC) Assessment & Plan: Improved with A1c of 6.2 today.  Continue Ozempic  at 0.5 mg weekly, metformin  500 mg twice daily. Foot exam today.  Follow-up in 6 months.  Orders: -     POCT glycosylated hemoglobin (Hb A1C)  PVC's (premature ventricular contractions) Assessment & Plan: Controlled.  Continue metoprolol  to tartrate 25 mg once daily.  We discussed the ability to add a second dose during the day if needed for     Assessment and Plan Assessment & Plan         Comer MARLA Gaskins, NP       [1]  Allergies Allergen Reactions   Breo Ellipta  [Fluticasone  Furoate-Vilanterol] Anaphylaxis   Coconut (Cocos Nucifera) Anaphylaxis   Coconut Oil Anaphylaxis   Iodine Anaphylaxis   Shellfish Allergy Anaphylaxis   Shellfish Protein-Containing Drug Products Anaphylaxis   Amlodipine  Swelling and Cough    LE Swelling   Trelegy Ellipta  [Fluticasone -Umeclidin-Vilant] Other (See Comments)    Dizzy and vision issues - tried twice  [2]  Current Outpatient Medications on File Prior to Visit  Medication Sig Dispense Refill   albuterol  (VENTOLIN  HFA) 108 (90 Base) MCG/ACT inhaler INHALE 1 TO 2 PUFFS EVERY 4 TO 6 HOURS AS NEEDED (Patient taking differently: Inhale 1-2 puffs into the lungs every 4 (four) hours as needed for wheezing or shortness of breath.) 1 each 0   Blood Glucose Monitoring Suppl DEVI 1 each by Does not apply route in  the morning, at noon, and at bedtime. May substitute to any manufacturer covered by patient's insurance. 1 each 0   fexofenadine  (ALLEGRA ) 180 MG tablet Take 1 tablet (180 mg total) by mouth daily. For allergies 90 tablet 1   fluticasone  (FLONASE ) 50 MCG/ACT nasal spray USE 1 SPRAY IN EACH NOSTRIL TWICE DAILY AS NEEDED FOR ALLERGIES OR RHINITIS. 48 g 3   Glucose Blood (BLOOD GLUCOSE TEST STRIPS) STRP 1 each by In Vitro route in the morning, at noon, and at bedtime. May substitute to any manufacturer covered by patient's insurance. 300 strip 1   Lancets Misc. MISC 1 each by Does not apply route in the morning, at noon, and at bedtime. May substitute to any manufacturer covered by patient's insurance. 300 each 1   latanoprost  (XALATAN ) 0.005 % ophthalmic solution Place 1 drop into both eyes at bedtime.     metFORMIN  (GLUCOPHAGE ) 500 MG tablet Take 1 tablet (500 mg total) by mouth 2 (two) times daily with a meal. for diabetes. 180 tablet 1   metoprolol  tartrate (LOPRESSOR ) 25 MG tablet Take 1 tablet (25 mg total) by mouth 2 (two) times daily. 180 tablet 3   montelukast  (SINGULAIR ) 10 MG tablet TAKE 1 TABLET EVERY DAY AT BEDTIME FOR ALLERGIES, ASTHMA (Patient taking differently: Take 10 mg by mouth at bedtime. TAKE 1 TABLET EVERY DAY AT BEDTIME FOR ALLERGIES, ASTHMA) 90 tablet 2   omeprazole  (PRILOSEC) 40 MG capsule Take 1 capsule (40 mg total) by mouth 2 (two) times daily. for heartburn. (Patient taking differently: Take 40 mg by mouth 2 (two) times daily. Heartburn) 180  capsule 1   rosuvastatin  (CRESTOR ) 5 MG tablet TAKE 1 TABLET EVERY DAY FOR CHOLESTEROL (Patient taking differently: Take 5 mg by mouth daily.) 90 tablet 1   Semaglutide ,0.25 or 0.5MG /DOS, (OZEMPIC , 0.25 OR 0.5 MG/DOSE,) 2 MG/3ML SOPN Inject 0.5 mg into the skin once a week. for diabetes. 12 mL 1   simethicone  (MYLICON) 80 MG chewable tablet Chew 80 mg by mouth every 6 (six) hours as needed for flatulence.     tamsulosin  (FLOMAX ) 0.4 MG  CAPS capsule TAKE 2 CAPSULES EVERY DAY FOR URINE FLOW (Patient taking differently: Take 0.8 mg by mouth daily.) 180 capsule 2   Tiotropium Bromide  Monohydrate (SPIRIVA  RESPIMAT) 1.25 MCG/ACT AERS Inhale 2 puffs into the lungs daily. 4 g 11   No current facility-administered medications on file prior to visit.

## 2024-10-31 NOTE — Patient Instructions (Signed)
 Continue Ozempic  and metformin  as prescribed.  Schedule your physical for 6 months  It was a pleasure to see you today!

## 2024-11-01 MED ORDER — METOPROLOL TARTRATE 25 MG PO TABS: 25.0000 mg | ORAL_TABLET | Freq: Two times a day (BID) | ORAL | 0 refills | Status: DC

## 2024-11-16 NOTE — Progress Notes (Deleted)
 "  Cardiology Office Note    Date:  11/16/2024  ID:  Brian Mullen, DOB 20-Nov-1945, MRN [id number] Cardiologist: Brian SHAUNNA Maywood, MD { :  History of Present Illness:    Brian Mullen is a 79 y.o. male with past medical history of HTN, HLD, Type II DM, palpitations and asthma who presents to the office today for routine follow-up.  He was last examined by Dr. Mallipeddi in 01/2023 and reported overall doing well at that time and was still having occasional palpitations but no persistent symptoms.  He was continued on Toprol -XL 25 mg daily and a CT calcium  score was recommended for screening with plans for him to follow-up in 2 years.  This was performed in 09/2023 and showed a coronary calcium  score of 0.  In the interim, he was admitted to [facility] in 07/2024 for syncope which was felt to be due to dehydration and orthostasis.  He had recently been started on Ozempic  and reported poor oral intake.  His monitor showed predominantly normal sinus rhythm with an average heart rate of 81 bpm.  He did have 1 run of SVT for 11 beats but had a 7.6% PVC burden.  He was contacted regarding results and did report having palpitations, therefore Toprol -XL was switched to Lopressor  25 mg twice daily with follow-up recommended in 3 months.  ROS: ***  Studies Reviewed:   EKG: EKG is*** ordered today and demonstrates ***   EKG Interpretation Date/Time:    Ventricular Rate:    PR Interval:    QRS Duration:    QT Interval:    QTC Calculation:   R Axis:      Text Interpretation:         Coronary Calcium  Score: 08/2023 FINDINGS: Non-cardiac: No significant non cardiac findings on limited lung and soft tissue windows. See separate report from [facility].   Ascending Aorta: Normal diameter 3.3 cm   Pericardium: Normal   Coronary arteries: Small amount of calcium  in mid LAD and Diagonal but either volume or density too low to register for scoring   IMPRESSION: Coronary calcium   score of 0.  Event Monitor: 08/2024   Patch wear time was for 13 days and 15 hours.   Normal sinus rhythm predominantly ranging from 62 to 126 bpm with an average HR 81 bpm.   1 run of nonsustained SVT occurred that lasted for 11 beats.   No evidence of atrial fibrillation/flutter, ventricular arrhythmias, high-grade AV block or pauses.   <1% PAC burden and 7.6% PVC burden.   Patient triggered events correlated with NSR (70 to 95 bpm), PVC, ventricular bigeminy, ventricular trigeminy.  Risk Assessment/Calculations:   {Does this patient have ATRIAL FIBRILLATION?:[phone]} No BP recorded.  {Refresh Note OR Click here to enter BP  :1}***         Physical Exam:   VS:  There were no vitals taken for this visit.   Wt Readings from Last 3 Encounters:  10/31/24 181 lb 8 oz (82.3 kg)  08/02/24 186 lb (84.4 kg)  07/26/24 189 lb (85.7 kg)     GEN: Well nourished, well developed in no acute distress NECK: No JVD; No carotid bruits CARDIAC: ***RRR, no murmurs, rubs, gallops RESPIRATORY:  Clear to auscultation without rales, wheezing or rhonchi  ABDOMEN: Appears non-distended. No obvious abdominal masses. EXTREMITIES: No clubbing or cyanosis. No edema.  Distal pedal pulses are 2+ bilaterally.   Assessment and Plan:      {Are you ordering a CV Procedure (e.g.  stress test, cath, DCCV, TEE, etc)?   Press F2        :[id number]}   Signed, Laymon CHRISTELLA Qua, PA-C   "

## 2024-11-21 NOTE — Progress Notes (Unsigned)
 "  Cardiology Office Note    Date:  11/22/2024  ID:  Brian Mullen, DOB 1945-12-12, MRN [id number] Cardiologist: Diannah SHAUNNA Maywood, MD Cardiology APP:  Johnson Laymon HERO, PA-C { :  History of Present Illness:    Brian Mullen is a 79 y.o. male with past medical history of HTN, HLD, Type II DM, palpitations and asthma who presents to the office today for routine follow-up.  He was last examined by Dr. Mallipeddi in 01/2023 and reported overall doing well at that time and was still having occasional palpitations but no persistent symptoms. He was continued on Toprol -XL 25 mg daily and a CT calcium  score was recommended for screening with plans for him to follow-up in 2 years. This was performed in 09/2023 and showed a coronary calcium  score of 0.  In the interim, he was admitted to [facility] in 07/2024 for syncope which was felt to be due to dehydration and orthostasis. He had recently been started on Ozempic  and reported poor oral intake. His monitor showed predominantly normal sinus rhythm with an average heart rate of 81 bpm. He did have 1 run of SVT for 11 beats but had a 7.6% PVC burden. He was contacted regarding results and did report having palpitations, therefore Toprol -XL was switched to Lopressor  25 mg twice daily with follow-up recommended in 3 months.  In talking with the patient today, he reports having occasional palpitations which typically occur mostly when stopping to rest. Noticed this last night after walking up an incline and did not have symptoms at that time but noticed this afterwards. He was only taking Lopressor  once daily and has been taking twice daily at times with improvement in symptoms. He consumes a few cups of half-calf coffee a day and no other caffeine intake. No recent alcohol use. He has baseline dyspnea on exertion but no recent chest pain, orthopnea, PND or pitting edema.   Studies Reviewed:   EKG: EKG is not ordered today.  Coronary Calcium  Score:  08/2023 FINDINGS: Non-cardiac: No significant non cardiac findings on limited lung and soft tissue windows. See separate report from [facility].   Ascending Aorta: Normal diameter 3.3 cm   Pericardium: Normal   Coronary arteries: Small amount of calcium  in mid LAD and Diagonal but either volume or density too low to register for scoring   IMPRESSION: Coronary calcium  score of 0.  Event Monitor: 08/2024   Patch wear time was for 13 days and 15 hours.   Normal sinus rhythm predominantly ranging from 62 to 126 bpm with an average HR 81 bpm.   1 run of nonsustained SVT occurred that lasted for 11 beats.   No evidence of atrial fibrillation/flutter, ventricular arrhythmias, high-grade AV block or pauses.   <1% PAC burden and 7.6% PVC burden.   Patient triggered events correlated with NSR (70 to 95 bpm), PVC, ventricular bigeminy, ventricular trigeminy.   Physical Exam:   VS:  BP 120/66 (BP Location: Right Arm, Cuff Size: Normal)   Pulse 77   Ht 5' 9 (1.753 m)   Wt 183 lb (83 kg)   SpO2 97%   BMI 27.02 kg/m    Wt Readings from Last 3 Encounters:  11/22/24 183 lb (83 kg)  10/31/24 181 lb 8 oz (82.3 kg)  08/02/24 186 lb (84.4 kg)     GEN: Well nourished, well developed male appearing in no acute distress NECK: No JVD; No carotid bruits CARDIAC: RRR, no murmurs, rubs, gallops RESPIRATORY:  Clear to auscultation  without rales, wheezing or rhonchi  ABDOMEN: Appears non-distended. No obvious abdominal masses. EXTREMITIES: No clubbing or cyanosis. No pitting edema.  Distal pedal pulses are 2+ bilaterally.   Assessment and Plan:   1. PVC's (premature ventricular contractions) - Recent monitor showed predominantly normal sinus rhythm with 1 brief episode of SVT and a 7.6% PVC burden. He was only taking Lopressor  once daily as this was how Toprol -XL was previously prescribed. He will try taking Lopressor  25 mg twice daily to see if this helps with his palpitations. Was  encouraged to continue to limit caffeine intake and he does not consume alcohol.  2. Essential hypertension - BP is well-controlled at 120/66 during today's visit. Continue current medical therapy with Lopressor  and he will try taking this as twice daily dosing as discussed above.  3. Mixed hyperlipidemia - FLP in 03/2024 showed total cholesterol 144 and LDL 50. Continue Crestor  5 mg daily.  Signed, Laymon CHRISTELLA Qua, PA-C   "

## 2024-11-22 ENCOUNTER — Ambulatory Visit: Admitting: Student

## 2024-11-22 ENCOUNTER — Ambulatory Visit: Attending: Student | Admitting: Student

## 2024-11-22 ENCOUNTER — Encounter: Payer: Self-pay | Admitting: Student

## 2024-11-22 VITALS — BP 120/66 | HR 77 | Ht 69.0 in | Wt 183.0 lb

## 2024-11-22 DIAGNOSIS — I10 Essential (primary) hypertension: Secondary | ICD-10-CM | POA: Diagnosis not present

## 2024-11-22 DIAGNOSIS — I493 Ventricular premature depolarization: Secondary | ICD-10-CM

## 2024-11-22 DIAGNOSIS — E782 Mixed hyperlipidemia: Secondary | ICD-10-CM | POA: Diagnosis not present

## 2024-11-22 MED ORDER — METOPROLOL TARTRATE 25 MG PO TABS: 25.0000 mg | ORAL_TABLET | Freq: Two times a day (BID) | ORAL | 3 refills | Status: AC

## 2024-11-22 NOTE — Patient Instructions (Signed)
 Medication Instructions:  Your physician recommends that you continue on your current medications as directed. Please refer to the Current Medication list given to you today.  Take Lopressor  25 mg Two Times Daily   *If you need a refill on your cardiac medications before your next appointment, please call your pharmacy*  Lab Work: NONE   If you have labs (blood work) drawn today and your tests are completely normal, you will receive your results only by: MyChart Message (if you have MyChart) OR A paper copy in the mail If you have any lab test that is abnormal or we need to change your treatment, we will call you to review the results.  Testing/Procedures: NONE   Follow-Up: At [facility], you and your health needs are our priority.  As part of our continuing mission to provide you with exceptional heart care, our providers are all part of one team.  This team includes your primary Cardiologist (physician) and Advanced Practice Providers or APPs (Physician Assistants and Nurse Practitioners) who all work together to provide you with the care you need, when you need it.  Your next appointment:   6 month(s)  Provider:   Laymon Qua, PA-C    We recommend signing up for the patient portal called MyChart.  Sign up information is provided on this After Visit Summary.  MyChart is used to connect with patients for Virtual Visits (Telemedicine).  Patients are able to view lab/test results, encounter notes, upcoming appointments, etc.  Non-urgent messages can be sent to your provider as well.   To learn more about what you can do with MyChart, go to [email or web address].   Other Instructions Thank you for choosing [facility]!

## 2025-01-17 ENCOUNTER — Ambulatory Visit: Payer: Medicare HMO

## 2025-01-31 ENCOUNTER — Ambulatory Visit

## 2025-04-10 ENCOUNTER — Encounter: Admitting: Primary Care

## 2025-05-01 ENCOUNTER — Encounter: Admitting: Primary Care
